# Patient Record
Sex: Female | Born: 1935 | Race: White | Hispanic: No | Marital: Married | Smoking: Never smoker
Health system: Southern US, Community
[De-identification: ages and names within clinical notes are randomized; demographics above are authoritative.]

## PROBLEM LIST (undated history)

## (undated) DIAGNOSIS — J449 Chronic obstructive pulmonary disease, unspecified: Secondary | ICD-10-CM

## (undated) DIAGNOSIS — E871 Hypo-osmolality and hyponatremia: Secondary | ICD-10-CM

## (undated) DIAGNOSIS — D649 Anemia, unspecified: Secondary | ICD-10-CM

## (undated) DIAGNOSIS — J42 Unspecified chronic bronchitis: Secondary | ICD-10-CM

## (undated) DIAGNOSIS — R931 Abnormal findings on diagnostic imaging of heart and coronary circulation: Secondary | ICD-10-CM

## (undated) DIAGNOSIS — C55 Malignant neoplasm of uterus, part unspecified: Secondary | ICD-10-CM

## (undated) DIAGNOSIS — T4145XA Adverse effect of unspecified anesthetic, initial encounter: Secondary | ICD-10-CM

## (undated) DIAGNOSIS — IMO0002 Reserved for concepts with insufficient information to code with codable children: Secondary | ICD-10-CM

## (undated) DIAGNOSIS — M1990 Unspecified osteoarthritis, unspecified site: Secondary | ICD-10-CM

## (undated) DIAGNOSIS — I10 Essential (primary) hypertension: Secondary | ICD-10-CM

## (undated) DIAGNOSIS — I429 Cardiomyopathy, unspecified: Secondary | ICD-10-CM

## (undated) DIAGNOSIS — J4489 Other specified chronic obstructive pulmonary disease: Secondary | ICD-10-CM

## (undated) DIAGNOSIS — E785 Hyperlipidemia, unspecified: Secondary | ICD-10-CM

## (undated) DIAGNOSIS — I700 Atherosclerosis of aorta: Secondary | ICD-10-CM

## (undated) DIAGNOSIS — J189 Pneumonia, unspecified organism: Secondary | ICD-10-CM

## (undated) DIAGNOSIS — T8859XA Other complications of anesthesia, initial encounter: Secondary | ICD-10-CM

## (undated) HISTORY — DX: Abnormal findings on diagnostic imaging of heart and coronary circulation: R93.1

## (undated) HISTORY — PX: SQUAMOUS CELL CARCINOMA EXCISION: SHX2433

## (undated) HISTORY — DX: Atherosclerosis of aorta: I70.0

## (undated) HISTORY — PX: BREAST BIOPSY: SHX20

## (undated) HISTORY — DX: Unspecified chronic bronchitis: J42

## (undated) HISTORY — DX: Unspecified osteoarthritis, unspecified site: M19.90

## (undated) HISTORY — DX: Other specified chronic obstructive pulmonary disease: J44.89

## (undated) HISTORY — PX: CATARACT EXTRACTION W/ INTRAOCULAR LENS  IMPLANT, BILATERAL: SHX1307

## (undated) HISTORY — PX: OTHER SURGICAL HISTORY: SHX169

## (undated) HISTORY — DX: Hyperlipidemia, unspecified: E78.5

## (undated) HISTORY — DX: Essential (primary) hypertension: I10

## (undated) HISTORY — DX: Chronic obstructive pulmonary disease, unspecified: J44.9

---
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
## 1964-05-25 HISTORY — PX: VAGINAL HYSTERECTOMY: SUR661

## 1964-05-25 HISTORY — PX: DILATION AND CURETTAGE OF UTERUS: SHX78

## 1997-12-17 ENCOUNTER — Ambulatory Visit (HOSPITAL_COMMUNITY): Admission: RE | Admit: 1997-12-17 | Discharge: 1997-12-17 | Payer: Self-pay | Admitting: Obstetrics and Gynecology

## 1998-12-12 ENCOUNTER — Other Ambulatory Visit: Admission: RE | Admit: 1998-12-12 | Discharge: 1998-12-12 | Payer: Self-pay | Admitting: Obstetrics and Gynecology

## 1998-12-19 ENCOUNTER — Ambulatory Visit (HOSPITAL_COMMUNITY): Admission: RE | Admit: 1998-12-19 | Discharge: 1998-12-19 | Payer: Self-pay | Admitting: Obstetrics and Gynecology

## 1998-12-19 ENCOUNTER — Encounter: Payer: Self-pay | Admitting: Obstetrics and Gynecology

## 1999-12-22 ENCOUNTER — Ambulatory Visit (HOSPITAL_COMMUNITY): Admission: RE | Admit: 1999-12-22 | Discharge: 1999-12-22 | Payer: Self-pay | Admitting: Obstetrics and Gynecology

## 1999-12-22 ENCOUNTER — Encounter: Payer: Self-pay | Admitting: Obstetrics and Gynecology

## 2001-01-03 ENCOUNTER — Ambulatory Visit (HOSPITAL_COMMUNITY): Admission: RE | Admit: 2001-01-03 | Discharge: 2001-01-03 | Payer: Self-pay | Admitting: Obstetrics and Gynecology

## 2001-01-03 ENCOUNTER — Encounter: Payer: Self-pay | Admitting: Obstetrics and Gynecology

## 2001-01-11 ENCOUNTER — Other Ambulatory Visit: Admission: RE | Admit: 2001-01-11 | Discharge: 2001-01-11 | Payer: Self-pay | Admitting: Obstetrics and Gynecology

## 2001-05-25 HISTORY — PX: CHOLECYSTECTOMY: SHX55

## 2001-12-07 ENCOUNTER — Inpatient Hospital Stay (HOSPITAL_COMMUNITY): Admission: EM | Admit: 2001-12-07 | Discharge: 2001-12-13 | Payer: Self-pay | Admitting: *Deleted

## 2001-12-07 ENCOUNTER — Encounter: Payer: Self-pay | Admitting: Emergency Medicine

## 2001-12-08 ENCOUNTER — Encounter: Payer: Self-pay | Admitting: General Surgery

## 2002-03-21 ENCOUNTER — Encounter: Payer: Self-pay | Admitting: Obstetrics and Gynecology

## 2002-03-21 ENCOUNTER — Encounter: Admission: RE | Admit: 2002-03-21 | Discharge: 2002-03-21 | Payer: Self-pay | Admitting: Obstetrics and Gynecology

## 2003-03-15 ENCOUNTER — Other Ambulatory Visit: Admission: RE | Admit: 2003-03-15 | Discharge: 2003-03-15 | Payer: Self-pay | Admitting: Obstetrics and Gynecology

## 2003-09-28 ENCOUNTER — Ambulatory Visit (HOSPITAL_COMMUNITY): Admission: RE | Admit: 2003-09-28 | Discharge: 2003-09-28 | Payer: Self-pay | Admitting: *Deleted

## 2004-09-23 ENCOUNTER — Other Ambulatory Visit: Admission: RE | Admit: 2004-09-23 | Discharge: 2004-09-23 | Payer: Self-pay | Admitting: Obstetrics and Gynecology

## 2010-12-05 ENCOUNTER — Encounter: Payer: Self-pay | Admitting: Cardiology

## 2010-12-05 DIAGNOSIS — R931 Abnormal findings on diagnostic imaging of heart and coronary circulation: Secondary | ICD-10-CM | POA: Insufficient documentation

## 2010-12-05 HISTORY — DX: Abnormal findings on diagnostic imaging of heart and coronary circulation: R93.1

## 2011-01-06 ENCOUNTER — Other Ambulatory Visit: Payer: Self-pay | Admitting: Allergy

## 2011-01-06 ENCOUNTER — Ambulatory Visit
Admission: RE | Admit: 2011-01-06 | Discharge: 2011-01-06 | Disposition: A | Discharge status: Home / Self Care | Payer: Medicare Other | Source: Ambulatory Visit | Attending: Allergy | Admitting: Allergy

## 2011-01-06 DIAGNOSIS — J209 Acute bronchitis, unspecified: Secondary | ICD-10-CM

## 2011-06-03 DIAGNOSIS — I10 Essential (primary) hypertension: Secondary | ICD-10-CM | POA: Diagnosis not present

## 2011-08-03 DIAGNOSIS — J209 Acute bronchitis, unspecified: Secondary | ICD-10-CM | POA: Diagnosis not present

## 2011-08-03 DIAGNOSIS — J309 Allergic rhinitis, unspecified: Secondary | ICD-10-CM | POA: Diagnosis not present

## 2011-08-03 DIAGNOSIS — J45909 Unspecified asthma, uncomplicated: Secondary | ICD-10-CM | POA: Diagnosis not present

## 2011-09-04 DIAGNOSIS — I10 Essential (primary) hypertension: Secondary | ICD-10-CM | POA: Diagnosis not present

## 2011-09-04 DIAGNOSIS — R002 Palpitations: Secondary | ICD-10-CM | POA: Diagnosis not present

## 2011-10-09 DIAGNOSIS — Z01419 Encounter for gynecological examination (general) (routine) without abnormal findings: Secondary | ICD-10-CM | POA: Diagnosis not present

## 2011-10-09 DIAGNOSIS — Z1231 Encounter for screening mammogram for malignant neoplasm of breast: Secondary | ICD-10-CM | POA: Diagnosis not present

## 2011-11-04 DIAGNOSIS — E782 Mixed hyperlipidemia: Secondary | ICD-10-CM | POA: Diagnosis not present

## 2011-11-04 DIAGNOSIS — Z0000 Encounter for general adult medical examination without abnormal findings: Secondary | ICD-10-CM | POA: Diagnosis not present

## 2011-11-04 DIAGNOSIS — R5383 Other fatigue: Secondary | ICD-10-CM | POA: Diagnosis not present

## 2011-11-04 DIAGNOSIS — I10 Essential (primary) hypertension: Secondary | ICD-10-CM | POA: Diagnosis not present

## 2011-11-04 DIAGNOSIS — R5381 Other malaise: Secondary | ICD-10-CM | POA: Diagnosis not present

## 2011-11-11 DIAGNOSIS — J45909 Unspecified asthma, uncomplicated: Secondary | ICD-10-CM | POA: Diagnosis not present

## 2011-11-11 DIAGNOSIS — I10 Essential (primary) hypertension: Secondary | ICD-10-CM | POA: Diagnosis not present

## 2011-11-11 DIAGNOSIS — R5381 Other malaise: Secondary | ICD-10-CM | POA: Diagnosis not present

## 2011-11-11 DIAGNOSIS — M159 Polyosteoarthritis, unspecified: Secondary | ICD-10-CM | POA: Diagnosis not present

## 2011-12-29 DIAGNOSIS — S92309A Fracture of unspecified metatarsal bone(s), unspecified foot, initial encounter for closed fracture: Secondary | ICD-10-CM | POA: Diagnosis not present

## 2012-01-07 DIAGNOSIS — S92309A Fracture of unspecified metatarsal bone(s), unspecified foot, initial encounter for closed fracture: Secondary | ICD-10-CM | POA: Diagnosis not present

## 2012-02-18 DIAGNOSIS — H26499 Other secondary cataract, unspecified eye: Secondary | ICD-10-CM | POA: Diagnosis not present

## 2012-02-19 DIAGNOSIS — Z23 Encounter for immunization: Secondary | ICD-10-CM | POA: Diagnosis not present

## 2012-03-07 DIAGNOSIS — E782 Mixed hyperlipidemia: Secondary | ICD-10-CM | POA: Diagnosis not present

## 2012-03-07 DIAGNOSIS — R0602 Shortness of breath: Secondary | ICD-10-CM | POA: Diagnosis not present

## 2012-03-07 DIAGNOSIS — I10 Essential (primary) hypertension: Secondary | ICD-10-CM | POA: Diagnosis not present

## 2012-03-14 DIAGNOSIS — D485 Neoplasm of uncertain behavior of skin: Secondary | ICD-10-CM | POA: Diagnosis not present

## 2012-03-14 DIAGNOSIS — L570 Actinic keratosis: Secondary | ICD-10-CM | POA: Diagnosis not present

## 2012-03-14 DIAGNOSIS — Z85828 Personal history of other malignant neoplasm of skin: Secondary | ICD-10-CM | POA: Diagnosis not present

## 2012-03-14 DIAGNOSIS — L821 Other seborrheic keratosis: Secondary | ICD-10-CM | POA: Diagnosis not present

## 2012-04-27 DIAGNOSIS — E782 Mixed hyperlipidemia: Secondary | ICD-10-CM | POA: Diagnosis not present

## 2012-04-27 DIAGNOSIS — I10 Essential (primary) hypertension: Secondary | ICD-10-CM | POA: Diagnosis not present

## 2012-04-27 DIAGNOSIS — D649 Anemia, unspecified: Secondary | ICD-10-CM | POA: Diagnosis not present

## 2012-05-04 DIAGNOSIS — J45909 Unspecified asthma, uncomplicated: Secondary | ICD-10-CM | POA: Diagnosis not present

## 2012-05-04 DIAGNOSIS — R5381 Other malaise: Secondary | ICD-10-CM | POA: Diagnosis not present

## 2012-05-04 DIAGNOSIS — I10 Essential (primary) hypertension: Secondary | ICD-10-CM | POA: Diagnosis not present

## 2012-05-04 DIAGNOSIS — E782 Mixed hyperlipidemia: Secondary | ICD-10-CM | POA: Diagnosis not present

## 2012-05-04 DIAGNOSIS — R5383 Other fatigue: Secondary | ICD-10-CM | POA: Diagnosis not present

## 2012-08-30 ENCOUNTER — Encounter: Payer: Self-pay | Admitting: Cardiology

## 2012-08-30 DIAGNOSIS — E782 Mixed hyperlipidemia: Secondary | ICD-10-CM | POA: Diagnosis not present

## 2012-08-30 DIAGNOSIS — R0602 Shortness of breath: Secondary | ICD-10-CM | POA: Diagnosis not present

## 2012-08-30 DIAGNOSIS — I10 Essential (primary) hypertension: Secondary | ICD-10-CM | POA: Diagnosis not present

## 2012-09-08 DIAGNOSIS — I10 Essential (primary) hypertension: Secondary | ICD-10-CM | POA: Diagnosis not present

## 2012-09-08 DIAGNOSIS — R5383 Other fatigue: Secondary | ICD-10-CM | POA: Diagnosis not present

## 2012-09-08 DIAGNOSIS — J45909 Unspecified asthma, uncomplicated: Secondary | ICD-10-CM | POA: Diagnosis not present

## 2012-09-08 DIAGNOSIS — E782 Mixed hyperlipidemia: Secondary | ICD-10-CM | POA: Diagnosis not present

## 2012-09-08 DIAGNOSIS — R5381 Other malaise: Secondary | ICD-10-CM | POA: Diagnosis not present

## 2012-09-15 DIAGNOSIS — R071 Chest pain on breathing: Secondary | ICD-10-CM | POA: Diagnosis not present

## 2012-09-15 DIAGNOSIS — R5381 Other malaise: Secondary | ICD-10-CM | POA: Diagnosis not present

## 2012-09-15 DIAGNOSIS — R079 Chest pain, unspecified: Secondary | ICD-10-CM | POA: Diagnosis not present

## 2012-09-15 DIAGNOSIS — E782 Mixed hyperlipidemia: Secondary | ICD-10-CM | POA: Diagnosis not present

## 2012-09-15 DIAGNOSIS — I10 Essential (primary) hypertension: Secondary | ICD-10-CM | POA: Diagnosis not present

## 2012-09-27 DIAGNOSIS — J209 Acute bronchitis, unspecified: Secondary | ICD-10-CM | POA: Diagnosis not present

## 2012-09-27 DIAGNOSIS — J309 Allergic rhinitis, unspecified: Secondary | ICD-10-CM | POA: Diagnosis not present

## 2012-09-27 DIAGNOSIS — J45909 Unspecified asthma, uncomplicated: Secondary | ICD-10-CM | POA: Diagnosis not present

## 2012-10-10 DIAGNOSIS — M949 Disorder of cartilage, unspecified: Secondary | ICD-10-CM | POA: Diagnosis not present

## 2012-10-10 DIAGNOSIS — Z124 Encounter for screening for malignant neoplasm of cervix: Secondary | ICD-10-CM | POA: Diagnosis not present

## 2012-10-10 DIAGNOSIS — Z13820 Encounter for screening for osteoporosis: Secondary | ICD-10-CM | POA: Diagnosis not present

## 2012-10-10 DIAGNOSIS — Z1231 Encounter for screening mammogram for malignant neoplasm of breast: Secondary | ICD-10-CM | POA: Diagnosis not present

## 2012-10-10 DIAGNOSIS — M899 Disorder of bone, unspecified: Secondary | ICD-10-CM | POA: Diagnosis not present

## 2012-10-28 ENCOUNTER — Other Ambulatory Visit: Payer: Self-pay | Admitting: Cardiology

## 2012-12-30 DIAGNOSIS — I10 Essential (primary) hypertension: Secondary | ICD-10-CM | POA: Diagnosis not present

## 2012-12-30 DIAGNOSIS — Z0000 Encounter for general adult medical examination without abnormal findings: Secondary | ICD-10-CM | POA: Diagnosis not present

## 2012-12-30 DIAGNOSIS — R5381 Other malaise: Secondary | ICD-10-CM | POA: Diagnosis not present

## 2012-12-30 DIAGNOSIS — E782 Mixed hyperlipidemia: Secondary | ICD-10-CM | POA: Diagnosis not present

## 2012-12-30 DIAGNOSIS — N390 Urinary tract infection, site not specified: Secondary | ICD-10-CM | POA: Diagnosis not present

## 2012-12-30 DIAGNOSIS — D649 Anemia, unspecified: Secondary | ICD-10-CM | POA: Diagnosis not present

## 2013-01-10 ENCOUNTER — Ambulatory Visit: Payer: Medicare Other | Admitting: Physician Assistant

## 2013-01-19 DIAGNOSIS — I10 Essential (primary) hypertension: Secondary | ICD-10-CM | POA: Diagnosis not present

## 2013-01-19 DIAGNOSIS — M159 Polyosteoarthritis, unspecified: Secondary | ICD-10-CM | POA: Diagnosis not present

## 2013-01-19 DIAGNOSIS — E782 Mixed hyperlipidemia: Secondary | ICD-10-CM | POA: Diagnosis not present

## 2013-01-19 DIAGNOSIS — Z23 Encounter for immunization: Secondary | ICD-10-CM | POA: Diagnosis not present

## 2013-01-19 DIAGNOSIS — J45909 Unspecified asthma, uncomplicated: Secondary | ICD-10-CM | POA: Diagnosis not present

## 2013-01-31 DIAGNOSIS — Z23 Encounter for immunization: Secondary | ICD-10-CM | POA: Diagnosis not present

## 2013-02-21 DIAGNOSIS — H26499 Other secondary cataract, unspecified eye: Secondary | ICD-10-CM | POA: Diagnosis not present

## 2013-02-21 DIAGNOSIS — H43399 Other vitreous opacities, unspecified eye: Secondary | ICD-10-CM | POA: Diagnosis not present

## 2013-03-01 ENCOUNTER — Ambulatory Visit (INDEPENDENT_AMBULATORY_CARE_PROVIDER_SITE_OTHER): Payer: Medicare Other | Admitting: Cardiology

## 2013-03-01 ENCOUNTER — Encounter: Payer: Self-pay | Admitting: Cardiology

## 2013-03-01 VITALS — BP 170/80 | HR 80 | Ht <= 58 in | Wt 130.0 lb

## 2013-03-01 DIAGNOSIS — J449 Chronic obstructive pulmonary disease, unspecified: Secondary | ICD-10-CM | POA: Diagnosis not present

## 2013-03-01 DIAGNOSIS — E785 Hyperlipidemia, unspecified: Secondary | ICD-10-CM | POA: Insufficient documentation

## 2013-03-01 DIAGNOSIS — B351 Tinea unguium: Secondary | ICD-10-CM | POA: Diagnosis not present

## 2013-03-01 DIAGNOSIS — I10 Essential (primary) hypertension: Secondary | ICD-10-CM

## 2013-03-01 DIAGNOSIS — L851 Acquired keratosis [keratoderma] palmaris et plantaris: Secondary | ICD-10-CM | POA: Diagnosis not present

## 2013-03-01 NOTE — Patient Instructions (Signed)
Let us know if your BP begins to increase.  A little high today.  Bring you cuff in one day and have nurse check it with our cuff.  Follow up with Dr. Herbie Baltimore in 6 months, call if any problems before that time.

## 2013-03-01 NOTE — Assessment & Plan Note (Signed)
Blood pressure is elevated today on recheck 160 systolic. She has a list of her outpatient blood pressures and they are all less than 140 systolic. She'll continue to monitor and if systolic pressure climbs she will call us.  Also asked her to bring her home cuff by the office the nurse could check it with our machines, to ensure accuracy.

## 2013-03-01 NOTE — Progress Notes (Signed)
03/01/2013   PCP: Georgianne Fick, MD   Chief Complaint  Patient presents with  . Follow-up    BP check up    Primary Cardiologist: Dr. Ranae Palms   HPI:  79 year old white married female presents today for routine followup. She has a history of hypertension, dyslipidemia, chronic bronchitis/COPD with long-standing asthma. She has no chest pain and no significant shortness of breath no more than her usual.  Her blood pressure is elevated today she does not understand why it would be elevated here and at home orthopedics it's normal. She has a list of her blood pressure she monitors at home and they all are less than 140 systolic.    Allergies  Allergen Reactions  . Asa [Aspirin] Shortness Of Breath    On high doses of ASA  . Keflex [Cephalexin]   . Penicillins     Current Outpatient Prescriptions  Medication Sig Dispense Refill  . ADVAIR DISKUS 250-50 MCG/DOSE AEPB Inhale 250 each into the lungs daily.      Marland Kitchen atorvastatin (LIPITOR) 10 MG tablet       . Azelastine HCl 0.15 % SOLN Place 2 sprays into the nose daily.      Marland Kitchen DILT-XR 120 MG 24 hr capsule TAKE 1 CAPSULE DAILY  90 capsule  2  . hydrALAZINE (APRESOLINE) 10 MG tablet Take 10 mg by mouth 2 (two) times daily.      . hydrochlorothiazide (HYDRODIURIL) 25 MG tablet Take 25 mg by mouth every morning.      . levalbuterol (XOPENEX HFA) 45 MCG/ACT inhaler Inhale 1-2 puffs into the lungs daily.      Marland Kitchen losartan (COZAAR) 50 MG tablet Take 50 mg by mouth 2 (two) times daily. 2 in the am and one in the pm      . montelukast (SINGULAIR) 10 MG tablet Take 10 mg by mouth daily.      . VENTOLIN HFA 108 (90 BASE) MCG/ACT inhaler        No current facility-administered medications for this visit.    Past Medical History  Diagnosis Date  . COPD with asthma   . HTN (hypertension)   . Dyslipidemia   . Chronic bronchitis   . Arthritis   . Echocardiogram abnormal 12/05/2010    Moderate concentric left ventricular  hypertrophy EF > 55% stage I diastolic dysfunction, left atrium mild to moderate dilatation, moderate mitral annular calcification with trace MR. Trace TR.    Past Surgical History  Procedure Laterality Date  . Vaginal hysterectomy  1966  . Appendectomy  1954  . Cataract extraction, bilateral    . Cholecystectomy  2003  . Skin cancer excision      face and leg    ZOX:WRUEAVW:UJ colds or fevers, no weight changes Skin:no rashes or ulcers HEENT:no blurred vision, no congestion CV:see HPI PUL:see HPI GI:no diarrhea constipation or melena, no indigestion GU:no hematuria, no dysuria MS:+ joint pain due to arthritis, no claudication Neuro:no syncope, no lightheadedness - unless she goes from sitting to standing Endo:no diabetes, no thyroid disease  PHYSICAL EXAM BP 170/80  Pulse 80  Ht 4\' 9"  (1.448 m)  Wt 130 lb (58.968 kg)  BMI 28.12 kg/m2 General:Pleasant affect, NAD Skin:Warm and dry, brisk capillary refill HEENT:normocephalic, sclera clear, mucus membranes moist Neck:supple, no JVD, no bruits, no adenopathy  Heart:S1S2 RRR without murmur, gallup, rub or click Lungsr without rales, occ rhonchi, no wheezes WJX:BJYN, non tender, + BS, do not palpate  liver spleen or masses Ext:no lower ext edema, 2+ pedal pulses, 2+ radial pulses Neuro:alert and oriented, MAE, follows commands, + facial symmetry   ASSESSMENT AND PLAN HTN (hypertension) Blood pressure is elevated today on recheck 160 systolic. She has a list of her outpatient blood pressures and they are all less than 140 systolic. She'll continue to monitor and if systolic pressure climbs she will call us.  Also asked her to bring her home cuff by the office the nurse could check it with our machines, to ensure accuracy.  COPD with asthma Currently stable she uses her nebulizers as needed  Dyslipidemia Followed by PCP   Followup with Dr. Herbie Baltimore in 6 months again she needs to have her blood pressure machine checked to  ensure that stable at home.

## 2013-03-01 NOTE — Assessment & Plan Note (Signed)
Currently stable she uses her nebulizers as needed

## 2013-03-01 NOTE — Assessment & Plan Note (Signed)
Followed by PCP

## 2013-03-20 DIAGNOSIS — L821 Other seborrheic keratosis: Secondary | ICD-10-CM | POA: Diagnosis not present

## 2013-03-20 DIAGNOSIS — Z85828 Personal history of other malignant neoplasm of skin: Secondary | ICD-10-CM | POA: Diagnosis not present

## 2013-03-21 DIAGNOSIS — H353190 Nonexudative age-related macular degeneration, unspecified eye, stage unspecified: Secondary | ICD-10-CM | POA: Diagnosis not present

## 2013-03-21 DIAGNOSIS — H35369 Drusen (degenerative) of macula, unspecified eye: Secondary | ICD-10-CM | POA: Diagnosis not present

## 2013-03-21 DIAGNOSIS — H26499 Other secondary cataract, unspecified eye: Secondary | ICD-10-CM | POA: Diagnosis not present

## 2013-04-05 ENCOUNTER — Ambulatory Visit: Payer: Self-pay | Admitting: Podiatrist

## 2013-04-07 ENCOUNTER — Other Ambulatory Visit: Payer: Self-pay | Admitting: Cardiology

## 2013-04-07 DIAGNOSIS — H26499 Other secondary cataract, unspecified eye: Secondary | ICD-10-CM | POA: Diagnosis not present

## 2013-04-07 NOTE — Telephone Encounter (Signed)
Rx was sent to pharmacy electronically. 

## 2013-04-10 DIAGNOSIS — J45909 Unspecified asthma, uncomplicated: Secondary | ICD-10-CM | POA: Diagnosis not present

## 2013-04-10 DIAGNOSIS — J309 Allergic rhinitis, unspecified: Secondary | ICD-10-CM | POA: Diagnosis not present

## 2013-05-04 DIAGNOSIS — H26499 Other secondary cataract, unspecified eye: Secondary | ICD-10-CM | POA: Diagnosis not present

## 2013-05-04 DIAGNOSIS — H43399 Other vitreous opacities, unspecified eye: Secondary | ICD-10-CM | POA: Diagnosis not present

## 2013-05-22 DIAGNOSIS — J069 Acute upper respiratory infection, unspecified: Secondary | ICD-10-CM | POA: Diagnosis not present

## 2013-05-22 DIAGNOSIS — R5381 Other malaise: Secondary | ICD-10-CM | POA: Diagnosis not present

## 2013-05-22 DIAGNOSIS — K5289 Other specified noninfective gastroenteritis and colitis: Secondary | ICD-10-CM | POA: Diagnosis not present

## 2013-05-22 DIAGNOSIS — R110 Nausea: Secondary | ICD-10-CM | POA: Diagnosis not present

## 2013-05-25 DIAGNOSIS — I700 Atherosclerosis of aorta: Secondary | ICD-10-CM

## 2013-05-25 HISTORY — DX: Atherosclerosis of aorta: I70.0

## 2013-06-06 ENCOUNTER — Other Ambulatory Visit: Payer: Self-pay | Admitting: Cardiology

## 2013-06-06 NOTE — Telephone Encounter (Signed)
Rx was sent to pharmacy electronically. 

## 2013-06-11 ENCOUNTER — Emergency Department (HOSPITAL_COMMUNITY): Payer: Medicare Other

## 2013-06-11 ENCOUNTER — Encounter (HOSPITAL_COMMUNITY): Payer: Self-pay | Admitting: Emergency Medicine

## 2013-06-11 ENCOUNTER — Inpatient Hospital Stay (HOSPITAL_COMMUNITY)
Admission: EM | Admit: 2013-06-11 | Discharge: 2013-06-13 | DRG: 191 | Disposition: A | Discharge status: Home / Self Care | Payer: Medicare Other | Attending: Internal Medicine | Admitting: Internal Medicine

## 2013-06-11 DIAGNOSIS — N179 Acute kidney failure, unspecified: Secondary | ICD-10-CM | POA: Diagnosis not present

## 2013-06-11 DIAGNOSIS — J449 Chronic obstructive pulmonary disease, unspecified: Secondary | ICD-10-CM | POA: Diagnosis not present

## 2013-06-11 DIAGNOSIS — Z85828 Personal history of other malignant neoplasm of skin: Secondary | ICD-10-CM | POA: Diagnosis not present

## 2013-06-11 DIAGNOSIS — I10 Essential (primary) hypertension: Secondary | ICD-10-CM | POA: Diagnosis present

## 2013-06-11 DIAGNOSIS — Z881 Allergy status to other antibiotic agents status: Secondary | ICD-10-CM

## 2013-06-11 DIAGNOSIS — R079 Chest pain, unspecified: Secondary | ICD-10-CM | POA: Diagnosis not present

## 2013-06-11 DIAGNOSIS — E876 Hypokalemia: Secondary | ICD-10-CM | POA: Diagnosis present

## 2013-06-11 DIAGNOSIS — R0602 Shortness of breath: Secondary | ICD-10-CM | POA: Diagnosis not present

## 2013-06-11 DIAGNOSIS — Z8249 Family history of ischemic heart disease and other diseases of the circulatory system: Secondary | ICD-10-CM | POA: Diagnosis not present

## 2013-06-11 DIAGNOSIS — Z825 Family history of asthma and other chronic lower respiratory diseases: Secondary | ICD-10-CM | POA: Diagnosis not present

## 2013-06-11 DIAGNOSIS — D649 Anemia, unspecified: Secondary | ICD-10-CM | POA: Diagnosis present

## 2013-06-11 DIAGNOSIS — E871 Hypo-osmolality and hyponatremia: Secondary | ICD-10-CM | POA: Diagnosis present

## 2013-06-11 DIAGNOSIS — Z9849 Cataract extraction status, unspecified eye: Secondary | ICD-10-CM | POA: Diagnosis not present

## 2013-06-11 DIAGNOSIS — R5381 Other malaise: Secondary | ICD-10-CM

## 2013-06-11 DIAGNOSIS — J441 Chronic obstructive pulmonary disease with (acute) exacerbation: Principal | ICD-10-CM | POA: Diagnosis present

## 2013-06-11 DIAGNOSIS — Z886 Allergy status to analgesic agent status: Secondary | ICD-10-CM

## 2013-06-11 DIAGNOSIS — Z880 Allergy status to penicillin: Secondary | ICD-10-CM | POA: Diagnosis not present

## 2013-06-11 DIAGNOSIS — M129 Arthropathy, unspecified: Secondary | ICD-10-CM | POA: Diagnosis present

## 2013-06-11 DIAGNOSIS — Z79899 Other long term (current) drug therapy: Secondary | ICD-10-CM

## 2013-06-11 DIAGNOSIS — K219 Gastro-esophageal reflux disease without esophagitis: Secondary | ICD-10-CM | POA: Diagnosis present

## 2013-06-11 DIAGNOSIS — D638 Anemia in other chronic diseases classified elsewhere: Secondary | ICD-10-CM | POA: Diagnosis present

## 2013-06-11 DIAGNOSIS — Z9089 Acquired absence of other organs: Secondary | ICD-10-CM | POA: Diagnosis not present

## 2013-06-11 DIAGNOSIS — R0789 Other chest pain: Secondary | ICD-10-CM | POA: Diagnosis not present

## 2013-06-11 DIAGNOSIS — IMO0002 Reserved for concepts with insufficient information to code with codable children: Secondary | ICD-10-CM

## 2013-06-11 DIAGNOSIS — E785 Hyperlipidemia, unspecified: Secondary | ICD-10-CM

## 2013-06-11 DIAGNOSIS — J45901 Unspecified asthma with (acute) exacerbation: Principal | ICD-10-CM

## 2013-06-11 LAB — POCT I-STAT, CHEM 8
BUN: 10 mg/dL (ref 6–23)
CHLORIDE: 83 meq/L — AB (ref 96–112)
CREATININE: 1.2 mg/dL — AB (ref 0.50–1.10)
Calcium, Ion: 1.07 mmol/L — ABNORMAL LOW (ref 1.13–1.30)
Glucose, Bld: 102 mg/dL — ABNORMAL HIGH (ref 70–99)
HCT: 30 % — ABNORMAL LOW (ref 36.0–46.0)
Hemoglobin: 10.2 g/dL — ABNORMAL LOW (ref 12.0–15.0)
Potassium: 2.9 mEq/L — CL (ref 3.7–5.3)
SODIUM: 118 meq/L — AB (ref 137–147)
TCO2: 21 mmol/L (ref 0–100)

## 2013-06-11 LAB — CBC WITH DIFFERENTIAL/PLATELET
Basophils Absolute: 0 10*3/uL (ref 0.0–0.1)
Basophils Relative: 1 % (ref 0–1)
EOS ABS: 0.1 10*3/uL (ref 0.0–0.7)
EOS PCT: 2 % (ref 0–5)
HCT: 23 % — ABNORMAL LOW (ref 36.0–46.0)
HEMOGLOBIN: 8.5 g/dL — AB (ref 12.0–15.0)
LYMPHS ABS: 2.3 10*3/uL (ref 0.7–4.0)
LYMPHS PCT: 38 % (ref 12–46)
MCH: 27.7 pg (ref 26.0–34.0)
MCHC: 36.6 g/dL — ABNORMAL HIGH (ref 30.0–36.0)
MCV: 75.4 fL — AB (ref 78.0–100.0)
MONOS PCT: 10 % (ref 3–12)
Monocytes Absolute: 0.6 10*3/uL (ref 0.1–1.0)
NEUTROS PCT: 49 % (ref 43–77)
Neutro Abs: 3 10*3/uL (ref 1.7–7.7)
PLATELETS: 234 10*3/uL (ref 150–400)
RBC: 3.05 MIL/uL — AB (ref 3.87–5.11)
RDW: 14.2 % (ref 11.5–15.5)
WBC: 6.2 10*3/uL (ref 4.0–10.5)

## 2013-06-11 LAB — URINALYSIS, ROUTINE W REFLEX MICROSCOPIC
Bilirubin Urine: NEGATIVE
Glucose, UA: NEGATIVE mg/dL
Hgb urine dipstick: NEGATIVE
Ketones, ur: NEGATIVE mg/dL
NITRITE: NEGATIVE
PROTEIN: NEGATIVE mg/dL
SPECIFIC GRAVITY, URINE: 1.005 (ref 1.005–1.030)
UROBILINOGEN UA: 0.2 mg/dL (ref 0.0–1.0)
pH: 7 (ref 5.0–8.0)

## 2013-06-11 LAB — D-DIMER, QUANTITATIVE (NOT AT ARMC): D DIMER QUANT: 0.58 ug{FEU}/mL — AB (ref 0.00–0.48)

## 2013-06-11 LAB — POCT I-STAT TROPONIN I: TROPONIN I, POC: 0.01 ng/mL (ref 0.00–0.08)

## 2013-06-11 LAB — URINE MICROSCOPIC-ADD ON

## 2013-06-11 LAB — TROPONIN I: Troponin I: <0.3 ng/mL (ref <0.30)

## 2013-06-11 MED ORDER — PREDNISONE 20 MG PO TABS
60.0000 mg | ORAL_TABLET | Freq: Once | ORAL | Status: AC
  Administered 2013-06-11: 60 mg via ORAL
  Filled 2013-06-11: qty 3

## 2013-06-11 MED ORDER — IPRATROPIUM BROMIDE 0.02 % IN SOLN
0.5000 mg | Freq: Four times a day (QID) | RESPIRATORY_TRACT | Status: DC
  Administered 2013-06-11 – 2013-06-13 (×8): 0.5 mg via RESPIRATORY_TRACT
  Filled 2013-06-11 (×7): qty 2.5

## 2013-06-11 MED ORDER — ACETAMINOPHEN 325 MG PO TABS: 650.0000 mg | ORAL_TABLET | Freq: Four times a day (QID) | ORAL | Status: DC | PRN

## 2013-06-11 MED ORDER — MONTELUKAST SODIUM 10 MG PO TABS
10.0000 mg | ORAL_TABLET | Freq: Every day | ORAL | Status: DC
  Administered 2013-06-12 (×2): 10 mg via ORAL
  Filled 2013-06-11 (×3): qty 1

## 2013-06-11 MED ORDER — SODIUM CHLORIDE 0.9 % IV SOLN
INTRAVENOUS | Status: AC
  Administered 2013-06-11: 23:00:00 via INTRAVENOUS

## 2013-06-11 MED ORDER — AZELASTINE HCL 0.1 % NA SOLN
2.0000 | Freq: Every day | NASAL | Status: DC
  Administered 2013-06-12 – 2013-06-13 (×2): 2 via NASAL
  Filled 2013-06-11: qty 30

## 2013-06-11 MED ORDER — HYDRALAZINE HCL 10 MG PO TABS
10.0000 mg | ORAL_TABLET | Freq: Three times a day (TID) | ORAL | Status: DC
  Administered 2013-06-12 – 2013-06-13 (×6): 10 mg via ORAL
  Filled 2013-06-11 (×8): qty 1

## 2013-06-11 MED ORDER — ATORVASTATIN CALCIUM 10 MG PO TABS
10.0000 mg | ORAL_TABLET | ORAL | Status: DC
  Administered 2013-06-12: 10 mg via ORAL
  Filled 2013-06-11: qty 1

## 2013-06-11 MED ORDER — ADULT MULTIVITAMIN W/MINERALS CH
1.0000 | ORAL_TABLET | Freq: Every day | ORAL | Status: DC
  Administered 2013-06-12 – 2013-06-13 (×2): 1 via ORAL
  Filled 2013-06-11 (×2): qty 1

## 2013-06-11 MED ORDER — SODIUM CHLORIDE 0.9 % IV SOLN
INTRAVENOUS | Status: DC
Start: 2013-06-11 — End: 2013-06-13
  Administered 2013-06-12: 07:00:00 via INTRAVENOUS
  Administered 2013-06-12 (×2): 1 mL via INTRAVENOUS
  Administered 2013-06-13: 75 mL/h via INTRAVENOUS

## 2013-06-11 MED ORDER — SODIUM CHLORIDE 0.9 % IV BOLUS (SEPSIS)
500.0000 mL | Freq: Once | INTRAVENOUS | Status: AC
  Administered 2013-06-11: 500 mL via INTRAVENOUS

## 2013-06-11 MED ORDER — LEVALBUTEROL HCL 0.63 MG/3ML IN NEBU
0.6300 mg | INHALATION_SOLUTION | Freq: Four times a day (QID) | RESPIRATORY_TRACT | Status: DC
  Administered 2013-06-11 – 2013-06-13 (×8): 0.63 mg via RESPIRATORY_TRACT
  Filled 2013-06-11 (×13): qty 3

## 2013-06-11 MED ORDER — HYDROCODONE-ACETAMINOPHEN 5-325 MG PO TABS: 1.0000 | ORAL_TABLET | ORAL | Status: DC | PRN

## 2013-06-11 MED ORDER — LEVALBUTEROL HCL 0.63 MG/3ML IN NEBU
0.6300 mg | INHALATION_SOLUTION | RESPIRATORY_TRACT | Status: DC | PRN
  Filled 2013-06-11: qty 3

## 2013-06-11 MED ORDER — ACETAMINOPHEN 650 MG RE SUPP: 650.0000 mg | Freq: Four times a day (QID) | RECTAL | Status: DC | PRN

## 2013-06-11 MED ORDER — IOHEXOL 350 MG/ML SOLN
100.0000 mL | Freq: Once | INTRAVENOUS | Status: AC | PRN
  Administered 2013-06-11: 100 mL via INTRAVENOUS

## 2013-06-11 MED ORDER — ONDANSETRON HCL 4 MG PO TABS: 4.0000 mg | ORAL_TABLET | Freq: Four times a day (QID) | ORAL | Status: DC | PRN

## 2013-06-11 MED ORDER — IPRATROPIUM-ALBUTEROL 0.5-2.5 (3) MG/3ML IN SOLN
3.0000 mL | Freq: Once | RESPIRATORY_TRACT | Status: AC
Start: 2013-06-11 — End: 2013-06-11
  Administered 2013-06-11: 3 mL via RESPIRATORY_TRACT
  Filled 2013-06-11: qty 3

## 2013-06-11 MED ORDER — DILTIAZEM HCL ER 120 MG PO CP24
120.0000 mg | ORAL_CAPSULE | Freq: Every day | ORAL | Status: DC
  Administered 2013-06-12 – 2013-06-13 (×2): 120 mg via ORAL
  Filled 2013-06-11 (×2): qty 1

## 2013-06-11 MED ORDER — POTASSIUM CHLORIDE CRYS ER 20 MEQ PO TBCR
40.0000 meq | EXTENDED_RELEASE_TABLET | Freq: Once | ORAL | Status: AC
  Administered 2013-06-11: 40 meq via ORAL
  Filled 2013-06-11: qty 2

## 2013-06-11 MED ORDER — SODIUM CHLORIDE 0.9 % IV SOLN: INTRAVENOUS | Status: DC

## 2013-06-11 MED ORDER — SODIUM CHLORIDE 0.9 % IJ SOLN
3.0000 mL | Freq: Two times a day (BID) | INTRAMUSCULAR | Status: DC
  Administered 2013-06-11 – 2013-06-12 (×2): 3 mL via INTRAVENOUS

## 2013-06-11 MED ORDER — METHYLPREDNISOLONE SODIUM SUCC 125 MG IJ SOLR
60.0000 mg | Freq: Two times a day (BID) | INTRAMUSCULAR | Status: DC
  Administered 2013-06-12 (×2): 60 mg via INTRAVENOUS
  Administered 2013-06-12: 01:00:00 via INTRAVENOUS
  Administered 2013-06-13: 60 mg via INTRAVENOUS
  Filled 2013-06-11 (×5): qty 0.96

## 2013-06-11 MED ORDER — IPRATROPIUM BROMIDE 0.02 % IN SOLN
0.5000 mg | RESPIRATORY_TRACT | Status: DC | PRN
  Filled 2013-06-11: qty 2.5

## 2013-06-11 MED ORDER — ONDANSETRON HCL 4 MG/2ML IJ SOLN: 4.0000 mg | Freq: Four times a day (QID) | INTRAMUSCULAR | Status: DC | PRN

## 2013-06-11 NOTE — ED Provider Notes (Signed)
CSN: [id number]     Arrival date & time 06/11/13  1653 History   First MD Initiated Contact with Patient 06/11/13 1712     Chief Complaint  Patient presents with  . Chest Pain   (Consider location/radiation/quality/duration/timing/severity/associated sxs/prior Treatment) HPI Comments: Patient presents with worsening shortness of breath and chest today. She's felt tight across her chest similar to weakness that she feels her asthma is flaring up. She feels like she can't get a good breath. She denies any increased cough or chest congestion. She denies any fevers or chills. She denies any nausea vomiting or diarrhea. There's been no leg swelling or calf tenderness. She is not on oxygen at home. She used her Xopenex inhaler and albuterol nebulizer without relief.  Patient is a 80 y.o. female presenting with chest pain.  Chest Pain Associated symptoms: fatigue and shortness of breath   Associated symptoms: no abdominal pain, no back pain, no cough, no diaphoresis, no dizziness, no fever, no headache, no nausea, no numbness, not vomiting and no weakness     Past Medical History  Diagnosis Date  . COPD with asthma   . HTN (hypertension)   . Dyslipidemia   . Chronic bronchitis   . Arthritis   . Echocardiogram abnormal 12/05/2010    Moderate concentric left ventricular hypertrophy EF > 29% stage I diastolic dysfunction, left atrium mild to moderate dilatation, moderate mitral annular calcification with trace MR. Trace TR.   Past Surgical History  Procedure Laterality Date  . Vaginal hysterectomy  1966  . Appendectomy  1954  . Cataract extraction, bilateral    . Cholecystectomy  2003  . Skin cancer excision      face and leg   Family History  Problem Relation Age of Onset  . Heart attack Mother   . Cancer Father   . Heart attack Maternal Grandmother   . Heart disease Son   . Asthma Daughter    History  Substance Use Topics  . Smoking status: Never Smoker   . Smokeless tobacco:  Never Used  . Alcohol Use: No   OB History   Grav Para Term Preterm Abortions TAB SAB Ect Mult Living                 Review of Systems  Constitutional: Positive for fatigue. Negative for fever, chills and diaphoresis.  HENT: Negative for congestion, rhinorrhea and sneezing.   Eyes: Negative.   Respiratory: Positive for shortness of breath. Negative for cough and chest tightness.   Cardiovascular: Positive for chest pain. Negative for leg swelling.  Gastrointestinal: Negative for nausea, vomiting, abdominal pain, diarrhea and blood in stool.  Genitourinary: Negative for frequency, hematuria, flank pain and difficulty urinating.  Musculoskeletal: Negative for arthralgias and back pain.  Skin: Negative for rash.  Neurological: Negative for dizziness, speech difficulty, weakness, numbness and headaches.    Allergies  Asa; Penicillins; and Keflex  Home Medications   Current Outpatient Rx  Name  Route  Sig  Dispense  Refill  . acetaminophen (TYLENOL) 500 MG tablet   Oral   Take 1,000 mg by mouth every 6 (six) hours as needed.         Marland Kitchen ADVAIR DISKUS 250-50 MCG/DOSE AEPB   Inhalation   Inhale 1 puff into the lungs 2 (two) times daily.          Marland Kitchen albuterol (ACCUNEB) 0.63 MG/3ML nebulizer solution   Nebulization   Take 1 ampule by nebulization every 6 (six) hours as needed for  wheezing.         . Artificial Tear Ointment (DRY EYES OP)   Ophthalmic   Apply 1 drop to eye 4 (four) times daily as needed (dry eyes).         Marland Kitchen atorvastatin (LIPITOR) 10 MG tablet   Oral   Take 10 mg by mouth every other day.          . Azelastine HCl 0.15 % SOLN   Nasal   Place 2 sprays into the nose daily.         Marland Kitchen CALCIUM PO   Oral   Take 1 tablet by mouth daily.         . Cholecalciferol (VITAMIN D PO)   Oral   Take 1 tablet by mouth daily.         . Cyanocobalamin (VITAMIN B-12 PO)   Oral   Take 1 tablet by mouth daily.         Marland Kitchen diltiazem (DILACOR XR) 120 MG 24  hr capsule   Oral   Take 120 mg by mouth daily.         . hydrALAZINE (APRESOLINE) 10 MG tablet   Oral   Take 10 mg by mouth daily.          . hydrochlorothiazide (HYDRODIURIL) 25 MG tablet   Oral   Take 25 mg by mouth every morning.         . levalbuterol (XOPENEX HFA) 45 MCG/ACT inhaler   Inhalation   Inhale 1-2 puffs into the lungs every 6 (six) hours as needed for wheezing or shortness of breath.          . losartan (COZAAR) 50 MG tablet   Oral   Take 50-100 mg by mouth 2 (two) times daily. 2 tablets in the morning and 1 tablet in the evening         . montelukast (SINGULAIR) 10 MG tablet   Oral   Take 10 mg by mouth at bedtime.          . Multiple Vitamin (MULTIVITAMIN WITH MINERALS) TABS tablet   Oral   Take 1 tablet by mouth daily.         . VENTOLIN HFA 108 (90 BASE) MCG/ACT inhaler   Inhalation   Inhale 2 puffs into the lungs every 4 (four) hours as needed for wheezing or shortness of breath.          Marland Kitchen VITAMIN E PO   Oral   Take 1 capsule by mouth daily.          BP 136/65  Pulse 77  Temp(Src) 98.5 F (36.9 C) (Oral)  Resp 20  SpO2 100% Physical Exam  Constitutional: She is oriented to person, place, and time. She appears well-developed and well-nourished.  HENT:  Head: Normocephalic and atraumatic.  Eyes: Pupils are equal, round, and reactive to light.  Neck: Normal range of motion. Neck supple.  Cardiovascular: Normal rate, regular rhythm and normal heart sounds.   Pulmonary/Chest: Effort normal and breath sounds normal. No respiratory distress. She has no wheezes. She has no rales. She exhibits no tenderness.  Mildly diminished breath sounds bilaterally. No increased work of breathing. No tachypnea.  Abdominal: Soft. Bowel sounds are normal. There is no tenderness. There is no rebound and no guarding.  Musculoskeletal: Normal range of motion. She exhibits no edema.  No calf tenderness  Lymphadenopathy:    She has no cervical  adenopathy.  Neurological: She is alert and oriented  to person, place, and time.  Skin: Skin is warm and dry. No rash noted.  Psychiatric: She has a normal mood and affect.    ED Course  Procedures (including critical care time) Labs Review Labs Reviewed  CBC WITH DIFFERENTIAL - Abnormal; Notable for the following:    RBC 3.05 (*)    Hemoglobin 8.5 (*)    HCT 23.0 (*)    MCV 75.4 (*)    MCHC 36.6 (*)    All other components within normal limits  POCT I-STAT, CHEM 8 - Abnormal; Notable for the following:    Sodium 118 (*)    Potassium 2.9 (*)    Chloride 83 (*)    Creatinine, Ser 1.20 (*)    Glucose, Bld 102 (*)    Calcium, Ion 1.07 (*)    Hemoglobin 10.2 (*)    HCT 30.0 (*)    All other components within normal limits  D-DIMER, QUANTITATIVE  URINALYSIS, ROUTINE W REFLEX MICROSCOPIC  POCT I-STAT TROPONIN I   Imaging Review Dg Chest 2 View  06/11/2013   CLINICAL DATA:  Sternal chest tightness and shortness of breath. History of asthma, hypertension, and cardiomyopathy. Nonsmoker.  EXAM: CHEST  2 VIEW  COMPARISON:  01/06/2011  FINDINGS: Mild hyperinflation.  Moderate thoracic spondylosis.  Midline trachea. Normal heart size with a tortuous atherosclerotic thoracic aorta. No pleural effusion or pneumothorax. No congestive failure. Mild bibasilar volume loss.  IMPRESSION: Cardiomegaly and mild hyperinflation; no acute findings.   Electronically Signed   By: Abigail Miyamoto M.D.   On: 06/11/2013 18:00    EKG Interpretation    Date/Time:  Sunday June 11 2013 17:23:05 EST Ventricular Rate:  90 PR Interval:  236 QRS Duration: 91 QT Interval:  382 QTC Calculation: 467 R Axis:     Text Interpretation:  Sinus rhythm Multiple premature complexes, vent  Prolonged PR interval Borderline T abnormalities, lateral leads Baseline wander in lead(s) V6 since last tracing no significant change Confirmed by Dason Mosley  MD, Aivah Putman ([id number]) on 06/11/2013 6:02:53 PM            MDM   1. Anemia    2. Hyponatremia   3. Hypokalemia    Patient received a breathing treatment which did not really improve her symptoms. She has no increased work of breathing. Her oxygen level is normal. She has marked anemia with a hemoglobin of 8. She says he she had a history of anemia a long time ago but doesn't know what was the cause of that or what her baseline hemoglobin is. She also has marked hyponatremia without a known history of hyponatremia. She was given normal saline as well as potassium replacement. A rectal exam did not show any gross blood, Hemoccult is negative.  D-dimer slightly elevated.  CTA chest pending.  I will consult hospitalist for admission.    Malvin Johns, MD 06/11/13 2015

## 2013-06-11 NOTE — ED Notes (Signed)
Pt declined sandwich at this time.

## 2013-06-11 NOTE — H&P (Signed)
[facility] History and Physical  KORI GOINS ATF:[id number] DOB: 1922-08-22 DOA: 06/11/2013  Referring physician: ER physician PCP: [name], MD   Chief Complaint: shortness of breath  HPI:  80 year old female with past medical history of COPD/asthma, hypertension who presented to [facility] ED 06/11/2013 with worsening shortness of breath with assocaited chest tightness especially with breathing. She reported the symptoms started about 1 day prior to this admission but no associated fever or chills. Chest tightness comes and goes and there was no significant complaint of cough. No abdominal pain, no nausea or vomiting. No reports of blood in stool or urine. No lightheadedness or loss of consciousness. In ED, her vitals were stable, BP was 136/65, HR 78, T max 98.5 F and oxygen saturation 99% on room air. Blood work revealed hemoglobin of 8.5. BMP revealed sodium of 118, potassium of 2.9 and creatinine of 1.2. CXR showed cardiomegaly but no acute infectious process. CT angio chest ordered in ED and was negative for PE. Pt was given nebulizer treatments in ED and one dose of PO prednisone but continued to feel short of breath for which reason [facility] admitted for further evaluation and management.   Assessment and Plan:  Principal Problem:   COPD (chronic obstructive pulmonary disease) - has history of COPD/asthma - continue nebulizer treatments every 6 hours scheduled and then every 2 hours PRN - solumedrol 60 mg IV Q 12 hours - oxygen support via nasal canula to keep O2 saturation above 90% - no evidence of pneumonia on CXR, no fever and no elevate WBC count on admission so antibiotic treatment deferred for now Active Problems:   Chest pain - pt reported chest pressure with breathing - D dimer elevated slightly on the admission - CT angio chest negative  - obtain CE   HTN (hypertension) - hold hctz and cozaar but continue Cardizem and hydralazine (change hydralazine to 10 mg Q 8  hours)   Dyslipidemia - continue statin therapy   Hyponatremia - likely due to Hctz  - continue IV fluids, hold Hctz - also will check cortisol level   Acute renal failure - likely due to Hctz, perhaps cozaar; hold those meds - may restart Cardizem and hydralazine (she take unusual regimen of hydralazine once a day, have changed it to Q 8 hours since we are holding Hctz and cozaar and SBP in 150's   Hypokalemia - due to Hctz - also check cortisol level - repleted in ED   Anemia - possible iron deficiency, MCV 75 - hemoglobin 8.5 on admission and then 10.2  Radiological Exams on Admission: Dg Chest 2 View 06/11/2013    IMPRESSION: Cardiomegaly and mild hyperinflation; no acute findings.   Electronically Signed   By: Abigail Miyamoto M.D.   On: 06/11/2013 18:00    EKG: Normal sinus rhythm  Code Status: Full Family Communication: Pt at bedside Disposition Plan: Admit for further evaluation  Leisa Lenz, MD  [facility] Pager [phone]  Review of Systems:  Constitutional: Negative for fever, chills and malaise/fatigue. Negative for diaphoresis.  HENT: Negative for hearing loss, ear pain, nosebleeds, congestion, sore throat, neck pain, tinnitus and ear discharge.   Eyes: Negative for blurred vision, double vision, photophobia, pain, discharge and redness.  Respiratory: Negative for cough, hemoptysis, sputum production, positive for shortness of breath, no wheezing and stridor.   Cardiovascular: Negative for chest pain, palpitations, orthopnea, claudication and leg swelling.  Gastrointestinal: Negative for nausea, vomiting and abdominal pain. Negative for heartburn, constipation, blood in  stool and melena.  Genitourinary: Negative for dysuria, urgency, frequency, hematuria and flank pain.  Musculoskeletal: Negative for myalgias, back pain, joint pain and falls.  Skin: Negative for itching and rash.  Neurological: Negative for dizziness and weakness. Negative for tingling,  tremors, sensory change, speech change, focal weakness, loss of consciousness and headaches.  Endo/Heme/Allergies: Negative for environmental allergies and polydipsia. Does not bruise/bleed easily.  Psychiatric/Behavioral: Negative for suicidal ideas. The patient is not nervous/anxious.      Past Medical History  Diagnosis Date  . COPD with asthma   . HTN (hypertension)   . Dyslipidemia   . Chronic bronchitis   . Arthritis   . Echocardiogram abnormal 12/05/2010    Moderate concentric left ventricular hypertrophy EF > 10% stage I diastolic dysfunction, left atrium mild to moderate dilatation, moderate mitral annular calcification with trace MR. Trace TR.   Past Surgical History  Procedure Laterality Date  . Vaginal hysterectomy  1966  . Appendectomy  1954  . Cataract extraction, bilateral    . Cholecystectomy  2003  . Skin cancer excision      face and leg   Social History:  reports that she has never smoked. She has never used smokeless tobacco. She reports that she does not drink alcohol or use illicit drugs.  Allergies  Allergen Reactions  . Asa [Aspirin] Shortness Of Breath    On high doses of ASA  . Penicillins     wheezing  . Keflex [Cephalexin] Rash    Family History  Problem Relation Age of Onset  . Heart attack Mother   . Cancer Father   . Heart attack Maternal Grandmother   . Heart disease Son   . Asthma Daughter      Prior to Admission medications   Medication Sig Start Date End Date Taking? Authorizing Provider  acetaminophen (TYLENOL) 500 MG tablet Take 1,000 mg by mouth every 6 (six) hours as needed.   Yes Historical Provider, MD  ADVAIR DISKUS 250-50 MCG/DOSE AEPB Inhale 1 puff into the lungs 2 (two) times daily.  01/26/13  Yes Historical Provider, MD  albuterol (ACCUNEB) 0.63 MG/3ML nebulizer solution Take 1 ampule by nebulization every 6 (six) hours as needed for wheezing.   Yes Historical Provider, MD  Artificial Tear Ointment (DRY EYES OP) Apply 1 drop  to eye 4 (four) times daily as needed (dry eyes).   Yes Historical Provider, MD  atorvastatin (LIPITOR) 10 MG tablet Take 10 mg by mouth every other day.  01/30/13  Yes Historical Provider, MD  Azelastine HCl 0.15 % SOLN Place 2 sprays into the nose daily. 12/03/12  Yes Historical Provider, MD  CALCIUM PO Take 1 tablet by mouth daily.   Yes Historical Provider, MD  Cholecalciferol (VITAMIN D PO) Take 1 tablet by mouth daily.   Yes Historical Provider, MD  Cyanocobalamin (VITAMIN B-12 PO) Take 1 tablet by mouth daily.   Yes Historical Provider, MD  diltiazem (DILACOR XR) 120 MG 24 hr capsule Take 120 mg by mouth daily.   Yes Historical Provider, MD  hydrALAZINE (APRESOLINE) 10 MG tablet Take 10 mg by mouth daily.    Yes Historical Provider, MD  hydrochlorothiazide (HYDRODIURIL) 25 MG tablet Take 25 mg by mouth every morning. 02/06/13  Yes Historical Provider, MD  levalbuterol Penne Lash HFA) 45 MCG/ACT inhaler Inhale 1-2 puffs into the lungs every 6 (six) hours as needed for wheezing or shortness of breath.    Yes Historical Provider, MD  losartan (COZAAR) 50 MG tablet Take  50-100 mg by mouth 2 (two) times daily. 2 tablets in the morning and 1 tablet in the evening   Yes Historical Provider, MD  montelukast (SINGULAIR) 10 MG tablet Take 10 mg by mouth at bedtime.  12/03/12  Yes Historical Provider, MD  Multiple Vitamin (MULTIVITAMIN WITH MINERALS) TABS tablet Take 1 tablet by mouth daily.   Yes Historical Provider, MD  VENTOLIN HFA 108 (90 BASE) MCG/ACT inhaler Inhale 2 puffs into the lungs every 4 (four) hours as needed for wheezing or shortness of breath.  01/26/13  Yes Historical Provider, MD  VITAMIN E PO Take 1 capsule by mouth daily.   Yes Historical Provider, MD   Physical Exam: Filed Vitals:   06/11/13 1845 06/11/13 1857 06/11/13 1900 06/11/13 1930  BP: 136/65  136/59 153/66  Pulse: 78  79 83  Temp:      TempSrc:      Resp: 20  13 14   SpO2: 100% 100% 100% 100%    Physical Exam   Constitutional: Appears ill, no acute distress HENT: Normocephalic. Dry mucus membranes Eyes: Conjunctivae and EOM are normal. PERRLA, no scleral icterus.  Neck: Normal ROM. Neck supple. No JVD. No tracheal deviation. No thyromegaly.  CVS: RRR, S1/S2 appreciated  Pulmonary: diminished breath sounds, coarse breath sounds.  Abdominal: Soft. BS +,  no distension, tenderness, rebound or guarding.  Musculoskeletal: Normal range of motion. No edema and no tenderness.  Lymphadenopathy: No lymphadenopathy noted, cervical, inguinal. Neuro: Alert. No focal neurological deficits. Skin: Skin is warm and dry. No rash noted. Not diaphoretic. No erythema. No pallor.  Psychiatric: Normal mood and affect. Behavior, judgment, thought content normal.   Labs on Admission:  Basic Metabolic Panel:  Recent Labs Lab 06/11/13 1924  NA 118*  K 2.9*  CL 83*  GLUCOSE 102*  BUN 10  CREATININE 1.20*   Liver Function Tests: No results found for this basename: AST, ALT, ALKPHOS, BILITOT, PROT, ALBUMIN,  in the last 168 hours No results found for this basename: LIPASE, AMYLASE,  in the last 168 hours No results found for this basename: AMMONIA,  in the last 168 hours CBC:  Recent Labs Lab 06/11/13 1918 06/11/13 1924  WBC 6.2  --   NEUTROABS 3.0  --   HGB 8.5* 10.2*  HCT 23.0* 30.0*  MCV 75.4*  --   PLT 234  --    Cardiac Enzymes: No results found for this basename: CKTOTAL, CKMB, CKMBINDEX, TROPONINI,  in the last 168 hours BNP: No components found with this basename: POCBNP,  CBG: No results found for this basename: GLUCAP,  in the last 168 hours  If 7PM-7AM, please contact night-coverage [email or web address] Password [facility] 06/11/2013, 8:38 PM

## 2013-06-11 NOTE — ED Notes (Signed)
DR BELFI GIVEN A COPY OF CHEM 8 RESULTS.

## 2013-06-11 NOTE — ED Notes (Signed)
The pt arrived from home by gems with complaints of sob and chest  Pressure since this am.  She has been diagnosed with bronchitis.  She has had her inhaler and a hhn at home with no relief.  The chest pressure increases with movement and with inspiration.   No distress at present

## 2013-06-12 DIAGNOSIS — D649 Anemia, unspecified: Secondary | ICD-10-CM

## 2013-06-12 DIAGNOSIS — E785 Hyperlipidemia, unspecified: Secondary | ICD-10-CM

## 2013-06-12 DIAGNOSIS — I10 Essential (primary) hypertension: Secondary | ICD-10-CM

## 2013-06-12 LAB — COMPREHENSIVE METABOLIC PANEL
ALT: 15 U/L (ref 0–35)
ALT: 16 U/L (ref 0–35)
AST: 25 U/L (ref 0–37)
AST: 28 U/L (ref 0–37)
Albumin: 3.3 g/dL — ABNORMAL LOW (ref 3.5–5.2)
Albumin: 3.5 g/dL (ref 3.5–5.2)
Alkaline Phosphatase: 64 U/L (ref 39–117)
Alkaline Phosphatase: 67 U/L (ref 39–117)
BILIRUBIN TOTAL: 0.3 mg/dL (ref 0.3–1.2)
BUN: 10 mg/dL (ref 6–23)
BUN: 10 mg/dL (ref 6–23)
CALCIUM: 8.5 mg/dL (ref 8.4–10.5)
CALCIUM: 8.9 mg/dL (ref 8.4–10.5)
CHLORIDE: 88 meq/L — AB (ref 96–112)
CO2: 20 mEq/L (ref 19–32)
CO2: 21 meq/L (ref 19–32)
Chloride: 85 mEq/L — ABNORMAL LOW (ref 96–112)
Creatinine, Ser: 1 mg/dL (ref 0.50–1.10)
Creatinine, Ser: 1 mg/dL (ref 0.50–1.10)
GFR calc non Af Amer: 49 mL/min — ABNORMAL LOW (ref >90)
GFR, EST AFRICAN AMERICAN: 57 mL/min — AB (ref >90)
GFR, EST AFRICAN AMERICAN: 57 mL/min — AB (ref >90)
GFR, EST NON AFRICAN AMERICAN: 49 mL/min — AB (ref >90)
GLUCOSE: 134 mg/dL — AB (ref 70–99)
GLUCOSE: 172 mg/dL — AB (ref 70–99)
POTASSIUM: 3.8 meq/L (ref 3.7–5.3)
Potassium: 3.7 mEq/L (ref 3.7–5.3)
Sodium: 121 mEq/L — CL (ref 137–147)
Sodium: 125 mEq/L — ABNORMAL LOW (ref 137–147)
Total Bilirubin: 0.4 mg/dL (ref 0.3–1.2)
Total Protein: 5.7 g/dL — ABNORMAL LOW (ref 6.0–8.3)
Total Protein: 6 g/dL (ref 6.0–8.3)

## 2013-06-12 LAB — CBC WITH DIFFERENTIAL/PLATELET
BASOS ABS: 0 10*3/uL (ref 0.0–0.1)
Basophils Relative: 0 % (ref 0–1)
EOS ABS: 0 10*3/uL (ref 0.0–0.7)
EOS PCT: 0 % (ref 0–5)
HCT: 30.7 % — ABNORMAL LOW (ref 36.0–46.0)
Hemoglobin: 11 g/dL — ABNORMAL LOW (ref 12.0–15.0)
Lymphocytes Relative: 11 % — ABNORMAL LOW (ref 12–46)
Lymphs Abs: 0.8 10*3/uL (ref 0.7–4.0)
MCH: 27.4 pg (ref 26.0–34.0)
MCHC: 35.8 g/dL (ref 30.0–36.0)
MCV: 76.6 fL — AB (ref 78.0–100.0)
MONO ABS: 0.1 10*3/uL (ref 0.1–1.0)
Monocytes Relative: 2 % — ABNORMAL LOW (ref 3–12)
Neutro Abs: 6.5 10*3/uL (ref 1.7–7.7)
Neutrophils Relative %: 88 % — ABNORMAL HIGH (ref 43–77)
Platelets: 268 10*3/uL (ref 150–400)
RBC: 4.01 MIL/uL (ref 3.87–5.11)
RDW: 14.2 % (ref 11.5–15.5)
WBC: 7.4 10*3/uL (ref 4.0–10.5)

## 2013-06-12 LAB — CBC
HCT: 30.1 % — ABNORMAL LOW (ref 36.0–46.0)
HEMOGLOBIN: 10.6 g/dL — AB (ref 12.0–15.0)
MCH: 27.3 pg (ref 26.0–34.0)
MCHC: 35.2 g/dL (ref 30.0–36.0)
MCV: 77.6 fL — ABNORMAL LOW (ref 78.0–100.0)
Platelets: 259 10*3/uL (ref 150–400)
RBC: 3.88 MIL/uL (ref 3.87–5.11)
RDW: 14.3 % (ref 11.5–15.5)
WBC: 4.1 10*3/uL (ref 4.0–10.5)

## 2013-06-12 LAB — IRON AND TIBC
Iron: 28 ug/dL — ABNORMAL LOW (ref 42–135)
Saturation Ratios: 10 % — ABNORMAL LOW (ref 20–55)
TIBC: 275 ug/dL (ref 250–470)
UIBC: 247 ug/dL (ref 125–400)

## 2013-06-12 LAB — OCCULT BLOOD, POC DEVICE: FECAL OCCULT BLD: NEGATIVE

## 2013-06-12 LAB — PHOSPHORUS: Phosphorus: 2.3 mg/dL (ref 2.3–4.6)

## 2013-06-12 LAB — TSH: TSH: 2.946 u[IU]/mL (ref 0.350–4.500)

## 2013-06-12 LAB — VITAMIN B12: VITAMIN B 12: 1692 pg/mL — AB (ref 211–911)

## 2013-06-12 LAB — RETICULOCYTES
RBC.: 3.57 MIL/uL — AB (ref 3.87–5.11)
RETIC CT PCT: 0.7 % (ref 0.4–3.1)
Retic Count, Absolute: 25 10*3/uL (ref 19.0–186.0)

## 2013-06-12 LAB — GLUCOSE, CAPILLARY: GLUCOSE-CAPILLARY: 157 mg/dL — AB (ref 70–99)

## 2013-06-12 LAB — MAGNESIUM: Magnesium: 1.6 mg/dL (ref 1.5–2.5)

## 2013-06-12 LAB — TROPONIN I
Troponin I: <0.3 ng/mL (ref <0.30)
Troponin I: <0.3 ng/mL (ref <0.30)

## 2013-06-12 LAB — APTT: APTT: 32 s (ref 24–37)

## 2013-06-12 LAB — FOLATE: Folate: >20 ng/mL

## 2013-06-12 LAB — FERRITIN: FERRITIN: 101 ng/mL (ref 10–291)

## 2013-06-12 LAB — PROTIME-INR
INR: 0.95 (ref 0.00–1.49)
Prothrombin Time: 12.5 seconds (ref 11.6–15.2)

## 2013-06-12 LAB — CORTISOL: CORTISOL PLASMA: 87.6 ug/dL

## 2013-06-12 MED ORDER — PANTOPRAZOLE SODIUM 40 MG PO TBEC
40.0000 mg | DELAYED_RELEASE_TABLET | Freq: Every day | ORAL | Status: DC
  Administered 2013-06-12 – 2013-06-13 (×2): 40 mg via ORAL
  Filled 2013-06-12 (×2): qty 1

## 2013-06-12 NOTE — Progress Notes (Signed)
[facility] PROGRESS NOTE  JASKIRAN PATA GYF:[id number] DOB: 11/23/1922 DOA: 06/11/2013 PCP: [name], MD  Assessment/Plan: 1-SOB due to COPD (chronic obstructive pulmonary disease) exacerbation: -improving, currently breathing better and with just slight exp wheezing -continue PRN and QID nebs; continue solumedrol -continue PRN oxygen supplementation -follow clinical response; most likely home in am  2-HTN (hypertension): stable with cardizen and hydralazine. -HCTZ and cozaar on hold due to ARF and hyponatremia  3-Dyslipidemia: continue statins  4-Chest discomfort: due to COPD and/or GERD -troponin neg -continue PPI -currently resolved  5-Hyponatremia and hypokalemia: due to dehydration and diuretics  -continue IVF's and electrolytes repletion -continue holding HCTZ  6-Acute renal failure:continue IVF's, UA no suggesting UTI. -continue holding HCTZ and cozaar.  7-Anemia: AOCD most likely. Will check anemia panel.  Code Status: partial  Family Communication: no family at bedside Disposition Plan: home with home health PT   Consultants:  None   Procedures:  See below for x-ray reports  Antibiotics:  None   HPI/Subjective: Afebrile, feeling better and breathing better.  Objective: Filed Vitals:   06/12/13 1015  BP: 126/60  Pulse: 94  Temp: 98.3 F (36.8 C)  Resp: 16    Intake/Output Summary (Last 24 hours) at 06/12/13 1408 Last data filed at 06/12/13 1336  Gross per 24 hour  Intake   1530 ml  Output    850 ml  Net    680 ml   Filed Weights   06/11/13 2205 06/12/13 0513  Weight: 53.751 kg (118 lb 8 oz) 52.617 kg (116 lb)    Exam:   General:  Afebrile, feeling and breathing better  Cardiovascular: S1 and s2, no rubs or gallops  Respiratory: slight exp wheezing, no crackles; decreased BS  Abdomen: soft, NT, ND, positive BS  Musculoskeletal: no swelling, no cyanosis  Data Reviewed: Basic Metabolic Panel:  Recent  Labs Lab 06/11/13 1924 06/11/13 2345 06/12/13 0335  NA 118* 121* 125*  K 2.9* 3.8 3.7  CL 83* 85* 88*  CO2  --  20 21  GLUCOSE 102* 134* 172*  BUN 10 10 10   CREATININE 1.20* 1.00 1.00  CALCIUM  --  8.9 8.5  MG  --  1.6  --   PHOS  --  2.3  --    Liver Function Tests:  Recent Labs Lab 06/11/13 2345 06/12/13 0335  AST 28 25  ALT 16 15  ALKPHOS 67 64  BILITOT 0.4 0.3  PROT 6.0 5.7*  ALBUMIN 3.5 3.3*   CBC:  Recent Labs Lab 06/11/13 1918 06/11/13 1924 06/11/13 2345 06/12/13 0335  WBC 6.2  --  7.4 4.1  NEUTROABS 3.0  --  6.5  --   HGB 8.5* 10.2* 11.0* 10.6*  HCT 23.0* 30.0* 30.7* 30.1*  MCV 75.4*  --  76.6* 77.6*  PLT 234  --  268 259   Cardiac Enzymes:  Recent Labs Lab 06/11/13 2236 06/12/13 0335 06/12/13 0843  TROPONINI <0.30 <0.30 <0.30   CBG:  Recent Labs Lab 06/12/13 0612  GLUCAP 157*   Studies: Dg Chest 2 View  06/11/2013   CLINICAL DATA:  Sternal chest tightness and shortness of breath. History of asthma, hypertension, and cardiomyopathy. Nonsmoker.  EXAM: CHEST  2 VIEW  COMPARISON:  01/06/2011  FINDINGS: Mild hyperinflation.  Moderate thoracic spondylosis.  Midline trachea. Normal heart size with a tortuous atherosclerotic thoracic aorta. No pleural effusion or pneumothorax. No congestive failure. Mild bibasilar volume loss.  IMPRESSION: Cardiomegaly and mild hyperinflation; no acute findings.   Electronically Signed  By: Abigail Miyamoto M.D.   On: 06/11/2013 18:00   Ct Angio Chest Pe W/cm &/or Wo Cm  06/11/2013   CLINICAL DATA:  Shortness of breath with chest pressure. Bronchitis. Question pulmonary embolism.  EXAM: CT ANGIOGRAPHY CHEST WITH CONTRAST  TECHNIQUE: Multidetector CT imaging of the chest was performed using the standard protocol during bolus administration of intravenous contrast. Multiplanar CT image reconstructions including MIPs were obtained to evaluate the vascular anatomy.  CONTRAST:  151mL OMNIPAQUE IOHEXOL 350 MG/ML SOLN   COMPARISON:  DG CHEST 2 VIEW dated 06/11/2013; DG CHEST 2 VIEW dated 01/06/2011  FINDINGS: The pulmonary arteries are well opacified with contrast. There is no evidence of acute pulmonary embolism. There is diffuse atherosclerosis of the aorta, great vessels and coronary arteries.  No enlarged mediastinal or hilar lymph nodes are identified. There is mild heterogeneity of the thyroid gland without dominant nodule. There is no significant pleural or pericardial effusion.  The lungs demonstrate mild emphysema with scattered scarring. There is no confluent airspace opacity, endobronchial lesion or suspicious pulmonary nodule. The visualized upper abdomen appears unremarkable.  There are mild degenerative changes throughout the spine. No acute osseous findings are demonstrated.  Review of the MIP images confirms the above findings.  IMPRESSION: 1. No evidence of acute pulmonary embolism. No acute chest findings demonstrated. 2. Diffuse atherosclerosis. 3. Chronic lung disease with mild emphysema.   Electronically Signed   By: Camie Patience M.D.   On: 06/11/2013 20:50    Scheduled Meds: . atorvastatin  10 mg Oral QODAY  . azelastine  2 spray Each Nare Daily  . diltiazem  120 mg Oral Daily  . hydrALAZINE  10 mg Oral Q8H  . ipratropium  0.5 mg Nebulization Q6H  . levalbuterol  0.63 mg Nebulization Q6H  . methylPREDNISolone (SOLU-MEDROL) injection  60 mg Intravenous Q12H  . montelukast  10 mg Oral QHS  . multivitamin with minerals  1 tablet Oral Daily  . sodium chloride  3 mL Intravenous Q12H   Continuous Infusions: . sodium chloride 1 mL (06/12/13 1350)  . sodium chloride      Time spent: >30 minutes    Dayshia Ballinas  [facility] Pager [phone]. If 7PM-7AM, please contact night-coverage at [email or web address], password [facility] 06/12/2013, 2:08 PM  LOS: 1 day

## 2013-06-12 NOTE — Care Management Note (Addendum)
    Page 1 of 2   06/14/2013     8:54:03 AM   CARE MANAGEMENT NOTE 06/14/2013  Patient:  Jasmin Lee, Jasmin Lee   Account Number:  [id number]  Date Initiated:  06/12/2013  Documentation initiated by:  Mariann Laster  Subjective/Objective Assessment:   admitted with COPD     Action/Plan:   CM Consult   Anticipated DC Date:  06/15/2013   Anticipated DC Plan:  [place]  CM consult      Choice offered to / List presented to:             Status of service:  Completed, signed off Medicare Important Message given?   (If response is "NO", the following Medicare IM given date fields will be blank) Date Medicare IM given:   Date Additional Medicare IM given:    Discharge Disposition:  HOME/SELF CARE  Per UR Regulation:  Reviewed for med. necessity/level of care/duration of stay  If discussed at [place] of Stay Meetings, dates discussed:    Comments:  06/13/2013 PT Recs:  Supervision/Assistance - 24 hour;Home health PT -Patient refuses all HH Services:  RN/PT DME Recs:  Rolling walker with 5" wheels - (Pt declining RW at present) OT: Recs:  None Patient  states she has neighbors and church friends to help as needed.  States she is active and cares for her husband.  Denies falls. PCP:  yes Home DME:  Cane, Nebulizer No further CM needs identified at this time. Crystal Hutchinson RN, BSN, [place], CCM 06/13/2013   06/12/2013 PT Recs:  Supervision/Assistance - 24 hour;Home health PT DME:  Rolling walker with 5" wheels (Pt declining RW at present) OT: Recs:  None Disposition:  pending Crysal Hutchinson RN, BSN, MSHL, CCm 06/12/2013

## 2013-06-12 NOTE — Progress Notes (Signed)
Patient evaluated for community based chronic disease management services with [place] [facility] as a benefit of patient's [employer]. Spoke with patient at bedside to explain [place] [facility] services.  Written consents obtained.  Patient may benefit from follow up for recent weight loss and deconditioning secondary to COPD.  She is the primary caregiver for her husband and admits that she does not take the best care of herself.  She does not have an acute management plan in place for her COPD at this time.  Patient will receive a post discharge transition of care call and will be evaluated for monthly home visits for assessments and disease process education.  Left contact information and [facility] literature at bedside. Made Inpatient Case Manager aware that [place] Management following. Of note, [facility] Care Management services does not replace or interfere with any services that are arranged by inpatient case management or social work.  For additional questions or referrals please contact Corliss Blacker BSN RN [facility] Liaison at [phone].

## 2013-06-12 NOTE — Progress Notes (Signed)
UR completed Ivionna Verley K. Granger Chui, RN, BSN, [place], CCM  06/12/2013 4:24 PM

## 2013-06-12 NOTE — Evaluation (Signed)
Physical Therapy Evaluation Patient Details Name: Jasmin Lee MRN: [id number] DOB: September 14, 1922 Today's Date: 06/12/2013 Time: 8657-8469 PT Time Calculation (min): 29 min  PT Assessment / Plan / Recommendation History of Present Illness  Pt admit with COPD.  Clinical Impression  Pt admitted with above. Pt currently with functional limitations due to the deficits listed below (see PT Problem List). 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  Recommended use of RW for pt (preferably 4 wheeled) for safety but pt declines at present.   Will need HHPT  f/u as well. Pt will benefit from skilled PT to increase their independence and safety with mobility to allow discharge to the venue listed below.     PT Assessment  Patient needs continued PT services    Follow Up Recommendations  Supervision/Assistance - 24 hour;Home health PT        Barriers to Discharge Decreased caregiver support Pt cooks and assist husband with ADLs.    Equipment Recommendations  Rolling walker with 5" wheels (Pt declining RW at present.  )         Frequency Min 3X/week    Precautions / Restrictions Precautions Precautions: Fall Restrictions Weight Bearing Restrictions: No   Pertinent Vitals/Pain VSS, no pain      Mobility  Bed Mobility General bed mobility comments: Pt on 3N1 on arrival. Transfers Overall transfer level: Needs assistance Equipment used: 1 person hand held assist Transfers: Sit to/from Stand Sit to Stand: Min guard General transfer comment: Pt needed cues for hand placement.  Cleaned self without assist.  STeadying assis needed upon initial stand.   Ambulation/Gait Ambulation/Gait assistance: Min assist Ambulation Distance (Feet): 75 Feet Assistive device: 1 person hand held assist Gait Pattern/deviations: Step-through pattern;Decreased stride length;Narrow base of support;Trunk flexed Gait velocity: decreased Gait velocity interpretation: Below normal speed for age/gender General  Gait Details: Pt needed steadying assist with ambulation.  Needed HHA and used IV pole for steadiness.  Mentioned RW for pt to use at home but pt states she does well with cane.  Pt is very careful with steps and does have some safety awareness as she moves slowly around the room.  Pt ultimately should use RW for safety but declines.     Exercises General Exercises - Lower Extremity Ankle Circles/Pumps: AROM;Both;15 reps;Seated Quad Sets: AROM;5 reps;Seated;Both Long Arc Quad: AROM;Both;10 reps;Seated   PT Diagnosis: Generalized weakness  PT Problem List: Decreased activity tolerance;Decreased balance;Decreased mobility;Decreased knowledge of use of DME;Decreased safety awareness;Decreased knowledge of precautions PT Treatment Interventions: Gait training;DME instruction;Functional mobility training;Therapeutic activities;Therapeutic exercise;Balance training;Patient/family education     PT Goals(Current goals can be found in the care plan section) Acute Rehab PT Goals Patient Stated Goal: to go home PT Goal Formulation: With patient Time For Goal Achievement: 06/19/13 Potential to Achieve Goals: Fair  Visit Information  Last PT Received On: 06/12/13 Assistance Needed: +1 History of Present Illness: Pt admit with COPD.       Prior [place] expects to be discharged to:: Private residence Living Arrangements: Spouse/significant other Available Help at Discharge: Family;Available PRN/intermittently Type of Home: House Home Access: Stairs to enter [employer] of Steps: 1 Entrance Stairs-Rails: None Home Layout: One level Home Equipment: Cane - single point;Shower seat - built in;Grab bars - tub/shower;Grab bars - toilet Additional Comments: Pt husband has dementia so she cooks and help him with ADLs.   Prior Function Level of Independence: Independent (sometimes pt used cane when her knees and ankle arthritis  fl)  Communication Communication: No difficulties    Cognition  Cognition Arousal/Alertness: Awake/alert Behavior During Therapy: WFL for tasks assessed/performed Overall Cognitive Status: Within Functional Limits for tasks assessed    Extremity/Trunk Assessment Upper Extremity Assessment Upper Extremity Assessment: Defer to OT evaluation Lower Extremity Assessment Lower Extremity Assessment: RLE deficits/detail;LLE deficits/detail RLE Deficits / Details: grossly 3/5 LLE Deficits / Details: grossly 3/5 Cervical / Trunk Assessment Cervical / Trunk Assessment: Kyphotic   Balance Balance Overall balance assessment: Needs assistance Sitting-balance support: Feet supported;Bilateral upper extremity supported Sitting balance-Leahy Scale: Good Standing balance support: Bilateral upper extremity supported;During functional activity Standing balance-Leahy Scale: Fair High level balance activites: Turns;Sudden stops;Direction changes High Level Balance Comments: min guard asssit needed for challenges.  End of Session PT - End of Session Equipment Utilized During Treatment: Gait belt Activity Tolerance: Patient limited by fatigue Patient left: in chair;with call bell/phone within reach Nurse Communication: Mobility status       INGOLD,Jacquelin Krajewski 06/12/2013, 11:23 AM [facility] [phone] (pager)

## 2013-06-12 NOTE — Evaluation (Signed)
Occupational Therapy Evaluation and Discharge Patient Details Name: Jasmin Lee MRN: [id number] DOB: February 01, 1923 Today's Date: 06/12/2013 Time: 8756-4332 OT Time Calculation (min): 21 min  OT Assessment / Plan / Recommendation History of present illness Pt admitted with COPD.   Clinical Impression   This 80 yo pt presents to acute OT with above dx. She is currently functioning at baseline with her ADL and IADL skills and does not require further OT services at this time. Acute OT to discharge pt.    OT Assessment  Patient does not need any further OT services    Follow Up Recommendations  No OT follow up       Equipment Recommendations  None recommended by OT          Precautions / Restrictions Precautions Precautions: Fall Restrictions Weight Bearing Restrictions: No       ADL  Lower Body Dressing: Performed;Independent Where Assessed - Lower Body Dressing: Unsupported sitting Toilet Transfer: Performed;Min guard Science writer: Regular height toilet Toileting - Clothing Manipulation and Hygiene: Performed;Independent Where Assessed - Toileting Clothing Manipulation and Hygiene: Standing ADL Comments: Pt reports independence in all ADLs. 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       Prior [place] expects to be discharged to:: Private residence Living Arrangements: Spouse/significant other Available Help at Discharge: Family;Available PRN/intermittently Type of Home: House Home Access: Stairs to enter [employer] of Steps: 1 Entrance Stairs-Rails: None Home Layout: One level Home Equipment: Cane - single point;Shower seat - built in;Grab bars - tub/shower;Grab  bars - toilet Additional Comments: Pt's husband has dementia so she cooks and help him with ADLs.   Prior Function Level of Independence: Independent Communication Communication: No difficulties         Vision/Perception Vision - History Baseline Vision: No visual deficits   Cognition  Cognition Arousal/Alertness: Awake/alert Behavior During Therapy: WFL for tasks assessed/performed Overall Cognitive Status: Within Functional Limits for tasks assessed    Extremity/Trunk Assessment Upper Extremity Assessment Upper Extremity Assessment: Overall WFL for tasks assessed Lower Extremity Assessment Lower Extremity Assessment: Defer to PT evaluation     Mobility Transfers Overall transfer level: Independent Transfers: Sit to/from Stand Sit to Stand: Min guard General transfer comment: Pt had near LOB x1 during ambulation, but self corrected. Did not use AD.        Balance Balance Overall balance assessment: Needs assistance Sitting-balance support: Feet supported;No upper extremity supported Sitting balance-Leahy Scale: Good Standing balance support: No upper extremity supported;During functional activity Standing balance-Leahy Scale: Good   End of Session OT - End of Session Activity Tolerance: Patient tolerated treatment well Patient left: in chair       Almon Register [phone] 06/12/2013, 2:15 PM

## 2013-06-12 NOTE — Progress Notes (Signed)
Na 121, before 118, notified MD, will continue to monitor, thanks Arvella Nigh RN

## 2013-06-13 DIAGNOSIS — R5381 Other malaise: Secondary | ICD-10-CM

## 2013-06-13 LAB — BASIC METABOLIC PANEL
BUN: 14 mg/dL (ref 6–23)
CO2: 21 mEq/L (ref 19–32)
CREATININE: 1.02 mg/dL (ref 0.50–1.10)
Calcium: 8.2 mg/dL — ABNORMAL LOW (ref 8.4–10.5)
Chloride: 97 mEq/L (ref 96–112)
GFR calc Af Amer: 55 mL/min — ABNORMAL LOW (ref >90)
GFR, EST NON AFRICAN AMERICAN: 48 mL/min — AB (ref >90)
Glucose, Bld: 167 mg/dL — ABNORMAL HIGH (ref 70–99)
Potassium: 3.6 mEq/L — ABNORMAL LOW (ref 3.7–5.3)
SODIUM: 131 meq/L — AB (ref 137–147)

## 2013-06-13 LAB — GLUCOSE, CAPILLARY: GLUCOSE-CAPILLARY: 143 mg/dL — AB (ref 70–99)

## 2013-06-13 MED ORDER — PANTOPRAZOLE SODIUM 40 MG PO TBEC: 40.0000 mg | DELAYED_RELEASE_TABLET | Freq: Every day | ORAL | Status: AC

## 2013-06-13 MED ORDER — PANTOPRAZOLE SODIUM 40 MG PO TBEC: 40.0000 mg | DELAYED_RELEASE_TABLET | Freq: Every day | ORAL | Status: DC

## 2013-06-13 MED ORDER — FUROSEMIDE 20 MG PO TABS: 20.0000 mg | ORAL_TABLET | Freq: Every day | ORAL | Status: DC

## 2013-06-13 MED ORDER — PREDNISONE 10 MG PO TABS: ORAL_TABLET | ORAL | Status: DC

## 2013-06-13 MED ORDER — HYDRALAZINE HCL 10 MG PO TABS: 10.0000 mg | ORAL_TABLET | Freq: Three times a day (TID) | ORAL | Status: DC

## 2013-06-13 MED ORDER — LOSARTAN POTASSIUM 50 MG PO TABS: 100.0000 mg | ORAL_TABLET | Freq: Every day | ORAL | Status: DC

## 2013-06-13 NOTE — Progress Notes (Signed)
Client will call her cardiologist instead of seeing primary doctor that is listed on discharge instructions.  Otherwise verbalized understanding of discharge instructions.  Given duplicates of medication RX in order to get those needed today and send others to [facility] ( takes 4 days to fill).  Neighbors here to give spouse and patient a ride home.

## 2013-06-13 NOTE — Progress Notes (Signed)
Physical Therapy Treatment Patient Details Name: Jasmin Lee MRN: [id number] DOB: 16-Aug-1922 Today's Date: 06/13/2013 Time: 1035-1100 PT Time Calculation (min): 25 min  PT Assessment / Plan / Recommendation  History of Present Illness Pt admitted with COPD.   PT Comments   Pt admitted with above. Pt currently with functional limitations due to balance and endurance deficits.  Pt will benefit from skilled PT to increase their independence and safety with mobility to allow discharge to the venue listed below.   Follow Up Recommendations  Supervision/Assistance - 24 hour;Home health PT                 Equipment Recommendations  Rolling walker with 5" wheels (Pt still declines RW. )        Frequency Min 3X/week   Progress towards PT Goals Progress towards PT goals: Progressing toward goals  Plan Current plan remains appropriate    Precautions / Restrictions Precautions Precautions: Fall Restrictions Weight Bearing Restrictions: No   Pertinent Vitals/Pain VSS, No pain    Mobility  Bed Mobility Overal bed mobility: Independent Transfers Overall transfer level: Independent Transfers: Sit to/from Stand Sit to Stand: Supervision General transfer comment: No steadying assist needed with sit to stand. Ambulation/Gait Ambulation/Gait assistance: Min guard Ambulation Distance (Feet): 350 Feet Assistive device: 1 person hand held assist;None Gait Pattern/deviations: Step-through pattern;Decreased stride length Gait velocity interpretation: Below normal speed for age/gender General Gait Details: Pt much steadier today with ambulation.  Used IV pole but also no assistive device.  Pt still declining RW at home but agrees to use cane and pt much steadier today therefore feel that cane will be appropriate for pt at home.      Exercises General Exercises - Lower Extremity Ankle Circles/Pumps: AROM;Both;15 reps;Seated Quad Sets: AROM;5 reps;Seated;Both Long Arc Quad: AROM;Both;10  reps;Seated   PT Goals (current goals can now be found in the care plan section)    Visit Information  Last PT Received On: 06/13/13 Assistance Needed: +1 History of Present Illness: Pt admitted with COPD.    Subjective Data  Subjective: "I am leaving today I think"   Cognition  Cognition Arousal/Alertness: Awake/alert Behavior During Therapy: WFL for tasks assessed/performed Overall Cognitive Status: Within Functional Limits for tasks assessed    Balance  Balance Standing balance support: No upper extremity supported;During functional activity Standing balance-Leahy Scale: Good High level balance activites: Turns;Sudden stops;Head turns High Level Balance Comments: supervision with min challenges.  End of Session PT - End of Session Equipment Utilized During Treatment: Gait belt Activity Tolerance: Patient limited by fatigue Patient left: in chair;with call bell/phone within reach Nurse Communication: Mobility status        INGOLD,Demetreus Lothamer 06/13/2013, 3:55 PM [facility] [phone] (pager)

## 2013-06-13 NOTE — Discharge Summary (Addendum)
Physician Discharge Summary  Jasmin Lee QQI:[id number] DOB: [birth year] DOA: 06/11/2013  PCP: [name], MD  Admit date: 06/11/2013 Discharge date: 06/13/2013  Time spent: >30  minutes  Recommendations for Outpatient Follow-up:  1. BMET to follow electrolytes and renal function 2. CBC to follow Hgb trend. 3. Reassess BP and adjust meds as needed  Discharge Diagnoses:  Principal Problem:   COPD (chronic obstructive pulmonary disease) Active Problems:   HTN (hypertension)   Dyslipidemia   Hyponatremia   Acute renal failure   Hypokalemia   Anemia E. Coli UTI  Discharge Condition: stable and improved. Discharge home with [facility] services for deconditioning.  Diet recommendation: heart healthy diet  Filed Weights   06/11/13 2205 06/12/13 0513 06/13/13 0515  Weight: 53.751 kg (118 lb 8 oz) 52.617 kg (116 lb) 55.2 kg (121 lb 11.1 oz)    History of present illness:  80 year old female with past medical history of COPD/asthma, hypertension who presented to [facility] ED 06/11/2013 with worsening shortness of breath with assocaited chest tightness especially with breathing. She reported the symptoms started about 1 day prior to this admission but no associated fever or chills. Chest tightness comes and goes and there was no significant complaint of cough. No abdominal pain, no nausea or vomiting. No reports of blood in stool or urine. No lightheadedness or loss of consciousness.  In ED, her vitals were stable, BP was 136/65, HR 78, T max 98.5 F and oxygen saturation 99% on room air. Blood work revealed hemoglobin of 8.5. BMP revealed sodium of 118, potassium of 2.9 and creatinine of 1.2. CXR showed cardiomegaly but no acute infectious process. CT angio chest ordered in ED and was negative for PE. Pt was given nebulizer treatments in ED and one dose of PO prednisone but continued to feel short of breath for which reason [facility] admitted for further evaluation and management.    Hospital Course:   1-SOB due to COPD (chronic obstructive pulmonary disease) exacerbation:  -improving, currently breathing better and without exp wheezing  -continue PRN nebs; tapering prednisone   -continue advair -will follow with PCP in 10 days  2-HTN (hypertension): stable with cardizen, lasix  and hydralazine.  -HCTZ discontinue due to hyponatremia  -cozaar resumed but at lower dose. -reassess BP and adjust meds during PCP follow up visit in 10 days  3-Dyslipidemia: continue statins   4-Chest discomfort: due to COPD and/or GERD  -troponin neg  -continue PPI  -currently resolved   5-Hyponatremia and hypokalemia: due to dehydration and diuretics  -resolved.  -continue holding HCTZ  -patient advised to keep herself well hydrated  6-Acute renal failure: pretty much resolved with IVF's -HCTZ discontinued -cozaar dose to be resume on 1/21 and dose has been adjusted -UA no suggesting UTI.   7-Anemia: AOCD most likely. With some iron deficiency. Patient was instructed to use ferrous sulfate OTC and will follow with PCP for further evaluation and tx.  8-physical deconditioning: will arrange for HHPT.  Addendum  9-E.Coli UTI: advise to keep herself hydrated and will treat with levaquin X 5 days.  Labs came after patient was discharged. Patient was contacted directly by me and symptoms reviewed and results shared. Patient endorses no dysuria, but reports some increase frequency (symptom she forgot to share with [place] while inpatient). Will treat as above.  Procedures:  See below for x-ray reports  Consultations:  None   Discharge Exam: Filed Vitals:   06/13/13 1426  BP: 144/65  Pulse: 84  Temp:  Resp:    General: Afebrile, feeling and breathing better Cardiovascular: S1 and s2, no rubs or gallops  Respiratory: no exp wheezing, no crackles; improved air movement BS  Abdomen: soft, NT, ND, positive BS  Musculoskeletal: no swelling, no cyanosis    Discharge Instructions  Discharge  Orders   Future Orders Complete By Expires   Discharge instructions  As directed    Comments:     Take medications as prescribed Follow with PCP in 10 days Keep yourself well hydrated Follow a heart healthy diet   Face-to-face encounter (required for Medicare/Medicaid patients)  As directed    Comments:     I Roshan Salamon certify that this patient is under my care and that I, or a nurse practitioner or physician's assistant working with me, had a face-to-face encounter that meets the physician face-to-face encounter requirements with this patient on 06/13/2013. The encounter with the patient was in whole, or in part for the following medical condition(s) which is the primary reason for home health care (List medical condition): physical deconditioning   Questions:     The encounter with the patient was in whole, or in part, for the following medical condition, which is the primary reason for home health care:  physical deconditioning   I certify that, based on my findings, the following services are medically necessary home health services:  Physical therapy   Nursing   My clinical findings support the need for the above services:  Unable to leave home safely without assistance and/or assistive device   Further, I certify that my clinical findings support that this patient is homebound due to:  Unable to leave home safely without assistance   Reason for Medically Necessary Home Health Services:  Skilled Nursing- Change/Decline in Patient Status   Skilled Nursing- Changes in Medication/Medication Management   Therapy- Personnel officer, Training and development officer and Stair Training   Therapy- Home Adaptation to [place]  As directed    Questions:     To provide the following care/treatments:  PT   RN       Medication List    STOP taking these medications       hydrochlorothiazide 25 MG tablet  Commonly known as:  HYDRODIURIL      TAKE these medications        acetaminophen 500 MG tablet  Commonly known as:  TYLENOL  Take 1,000 mg by mouth every 6 (six) hours as needed.     ADVAIR DISKUS 250-50 MCG/DOSE Aepb  Generic drug:  Fluticasone-Salmeterol  Inhale 1 puff into the lungs 2 (two) times daily.     atorvastatin 10 MG tablet  Commonly known as:  LIPITOR  Take 10 mg by mouth every other day.     Azelastine HCl 0.15 % Soln  Place 2 sprays into the nose daily.     CALCIUM PO  Take 1 tablet by mouth daily.     diltiazem 120 MG 24 hr capsule  Commonly known as:  DILACOR XR  Take 120 mg by mouth daily.     DRY EYES OP  Apply 1 drop to eye 4 (four) times daily as needed (dry eyes).     furosemide 20 MG tablet  Commonly known as:  LASIX  Take 1 tablet (20 mg total) by mouth daily.     hydrALAZINE 10 MG tablet  Commonly known as:  APRESOLINE  Take 1 tablet (10 mg total) by mouth 3 (three) times daily.  levalbuterol 45 MCG/ACT inhaler  Commonly known as:  XOPENEX HFA  Inhale 1-2 puffs into the lungs every 6 (six) hours as needed for wheezing or shortness of breath.     losartan 50 MG tablet  Commonly known as:  COZAAR  Take 2 tablets (100 mg total) by mouth daily. 2 tablets in the morning and 1 tablet in the evening     montelukast 10 MG tablet  Commonly known as:  SINGULAIR  Take 10 mg by mouth at bedtime.     multivitamin with minerals Tabs tablet  Take 1 tablet by mouth daily.     pantoprazole 40 MG tablet  Commonly known as:  PROTONIX  Take 1 tablet (40 mg total) by mouth daily.     predniSONE 10 MG tablet  Commonly known as:  DELTASONE  Take 5 tabs by mouth daily X 1 day; then 4 tabs by mouth daily X 2 days; then 2 tabs by mouth daily X 3 days; then 1 tab by mouth daily X 3 days and stop prednisone     albuterol 0.63 MG/3ML nebulizer solution  Commonly known as:  ACCUNEB  Take 1 ampule by nebulization every 6 (six) hours as needed for wheezing.     VENTOLIN HFA 108 (90 BASE) MCG/ACT inhaler  Generic drug:   albuterol  Inhale 2 puffs into the lungs every 4 (four) hours as needed for wheezing or shortness of breath.     VITAMIN B-12 PO  Take 1 tablet by mouth daily.     VITAMIN D PO  Take 1 tablet by mouth daily.     VITAMIN E PO  Take 1 capsule by mouth daily.       Allergies  Allergen Reactions  . Asa [Aspirin] Shortness Of Breath    On high doses of ASA  . Penicillins     wheezing  . Keflex [Cephalexin] Rash       Follow-up Information   Follow up with [facility], [place]. Schedule an appointment as soon as possible for a visit in 10 days.   Specialty:  Internal Medicine   Contact information:   [address] [place] [address] [phone]       The results of significant diagnostics from this hospitalization (including imaging, microbiology, ancillary and laboratory) are listed below for reference.    Significant Diagnostic Studies: Dg Chest 2 View  06/11/2013   CLINICAL DATA:  Sternal chest tightness and shortness of breath. History of asthma, hypertension, and cardiomyopathy. Nonsmoker.  EXAM: CHEST  2 VIEW  COMPARISON:  01/06/2011  FINDINGS: Mild hyperinflation.  Moderate thoracic spondylosis.  Midline trachea. Normal heart size with a tortuous atherosclerotic thoracic aorta. No pleural effusion or pneumothorax. No congestive failure. Mild bibasilar volume loss.  IMPRESSION: Cardiomegaly and mild hyperinflation; no acute findings.   Electronically Signed   By: Abigail Miyamoto M.D.   On: 06/11/2013 18:00   Ct Angio Chest Pe W/cm &/or Wo Cm  06/11/2013   CLINICAL DATA:  Shortness of breath with chest pressure. Bronchitis. Question pulmonary embolism.  EXAM: CT ANGIOGRAPHY CHEST WITH CONTRAST  TECHNIQUE: Multidetector CT imaging of the chest was performed using the standard protocol during bolus administration of intravenous contrast. Multiplanar CT image reconstructions including MIPs were obtained to evaluate the vascular anatomy.  CONTRAST:  178mL  OMNIPAQUE IOHEXOL 350 MG/ML SOLN  COMPARISON:  DG CHEST 2 VIEW dated 06/11/2013; DG CHEST 2 VIEW dated 01/06/2011  FINDINGS: The pulmonary arteries are well opacified with contrast.  There is no evidence of acute pulmonary embolism. There is diffuse atherosclerosis of the aorta, great vessels and coronary arteries.  No enlarged mediastinal or hilar lymph nodes are identified. There is mild heterogeneity of the thyroid gland without dominant nodule. There is no significant pleural or pericardial effusion.  The lungs demonstrate mild emphysema with scattered scarring. There is no confluent airspace opacity, endobronchial lesion or suspicious pulmonary nodule. The visualized upper abdomen appears unremarkable.  There are mild degenerative changes throughout the spine. No acute osseous findings are demonstrated.  Review of the MIP images confirms the above findings.  IMPRESSION: 1. No evidence of acute pulmonary embolism. No acute chest findings demonstrated. 2. Diffuse atherosclerosis. 3. Chronic lung disease with mild emphysema.   Electronically Signed   By: Camie Patience M.D.   On: 06/11/2013 20:50    Microbiology: Recent Results (from the past 240 hour(s))  URINE CULTURE     Status: None   Collection Time    06/11/13  7:51 PM      Result Value Range Status   Specimen Description URINE, CLEAN CATCH   Final   Special Requests ADDED 2032   Final   Culture  Setup Time     Final   Value: 06/11/2013 20:53     Performed at [facility] PENDING   Incomplete   Culture     Final   Value: Culture reincubated for better growth     Performed at [facility]   Report Status PENDING   Incomplete     Labs: Basic Metabolic Panel:  Recent Labs Lab 06/11/13 1924 06/11/13 2345 06/12/13 0335 06/13/13 0851  NA 118* 121* 125* 131*  K 2.9* 3.8 3.7 3.6*  CL 83* 85* 88* 97  CO2  --  20 21 21   GLUCOSE 102* 134* 172* 167*  BUN 10 10 10 14   CREATININE 1.20* 1.00 1.00 1.02  CALCIUM   --  8.9 8.5 8.2*  MG  --  1.6  --   --   PHOS  --  2.3  --   --    Liver Function Tests:  Recent Labs Lab 06/11/13 2345 06/12/13 0335  AST 28 25  ALT 16 15  ALKPHOS 67 64  BILITOT 0.4 0.3  PROT 6.0 5.7*  ALBUMIN 3.5 3.3*   CBC:  Recent Labs Lab 06/11/13 1918 06/11/13 1924 06/11/13 2345 06/12/13 0335  WBC 6.2  --  7.4 4.1  NEUTROABS 3.0  --  6.5  --   HGB 8.5* 10.2* 11.0* 10.6*  HCT 23.0* 30.0* 30.7* 30.1*  MCV 75.4*  --  76.6* 77.6*  PLT 234  --  268 259   Cardiac Enzymes:  Recent Labs Lab 06/11/13 2236 06/12/13 0335 06/12/13 0843  TROPONINI <0.30 <0.30 <0.30   CBG:  Recent Labs Lab 06/12/13 0612 06/13/13 0703  GLUCAP 157* 143*    Signed:  Britnay Magnussen  [facility] 06/13/2013, 2:32 PM

## 2013-06-14 LAB — URINE CULTURE: Colony Count: >=100000

## 2013-06-19 DIAGNOSIS — D649 Anemia, unspecified: Secondary | ICD-10-CM | POA: Diagnosis not present

## 2013-06-19 DIAGNOSIS — N390 Urinary tract infection, site not specified: Secondary | ICD-10-CM | POA: Diagnosis not present

## 2013-06-19 DIAGNOSIS — N289 Disorder of kidney and ureter, unspecified: Secondary | ICD-10-CM | POA: Diagnosis not present

## 2013-06-19 DIAGNOSIS — R0602 Shortness of breath: Secondary | ICD-10-CM | POA: Diagnosis not present

## 2013-06-21 ENCOUNTER — Encounter: Payer: Self-pay | Admitting: Cardiology

## 2013-06-21 ENCOUNTER — Ambulatory Visit (INDEPENDENT_AMBULATORY_CARE_PROVIDER_SITE_OTHER): Payer: Medicare Other | Admitting: Cardiology

## 2013-06-21 ENCOUNTER — Telehealth: Payer: Self-pay | Admitting: Cardiology

## 2013-06-21 VITALS — BP 182/94 | HR 82 | Ht <= 58 in | Wt 119.0 lb

## 2013-06-21 DIAGNOSIS — I10 Essential (primary) hypertension: Secondary | ICD-10-CM | POA: Diagnosis not present

## 2013-06-21 DIAGNOSIS — R5383 Other fatigue: Secondary | ICD-10-CM | POA: Diagnosis not present

## 2013-06-21 DIAGNOSIS — R5381 Other malaise: Secondary | ICD-10-CM | POA: Diagnosis not present

## 2013-06-21 DIAGNOSIS — J449 Chronic obstructive pulmonary disease, unspecified: Secondary | ICD-10-CM | POA: Diagnosis not present

## 2013-06-21 DIAGNOSIS — E871 Hypo-osmolality and hyponatremia: Secondary | ICD-10-CM | POA: Diagnosis not present

## 2013-06-21 DIAGNOSIS — R531 Weakness: Secondary | ICD-10-CM

## 2013-06-21 MED ORDER — HYDROCHLOROTHIAZIDE 12.5 MG PO CAPS: 12.5000 mg | ORAL_CAPSULE | Freq: Every day | ORAL | Status: DC

## 2013-06-21 NOTE — Telephone Encounter (Signed)
Went to the hospital and was admitted on 06-11-13. While she was there they put her on Furosemide and she is having problems and side effects from it.Please call her.

## 2013-06-21 NOTE — Assessment & Plan Note (Signed)
Pt was placed on Lasix with hospital discharge due to hypokalemia and hyponatremia on recent hospitalization for COPD exacerbation.  What is not noted is that pt.  Was sick for 1-2 weeks before admit without eating much and a 10 pound wt. Loss.  Today she complains after taking Lasix she begins shaking and feel terrible with weakness like she cannot go.  Usually this is a very active 80 year old.  I have stopped her Lasix and resumed HCTZ at 12.5 mg daily.  She will have repeat labs in 1 week.  She recently had labs with PCP this past Monday.

## 2013-06-21 NOTE — Assessment & Plan Note (Signed)
Recent exacerbation 

## 2013-06-21 NOTE — Assessment & Plan Note (Signed)
Her BP is elevated today, but at home she tells me it is well controlled.  This has been noted on past visits as well.  She will need follow up BP in 2 weeks to make sure stable.

## 2013-06-21 NOTE — Patient Instructions (Signed)
Stop furosemide  Begin half of your 25 mg tab of hydrochlorothiazide to equal 12.5 mg daily Have lab work done this week and then next week to make sure your sodium is stable.  Call if you BP is elevated.

## 2013-06-21 NOTE — Progress Notes (Signed)
06/21/2013   PCP: [name], MD   Chief Complaint  Patient presents with  . Follow-up    BP Check and med change    Primary Cardiologist: Dr. [name]  HPI:  Pt called to be seen today secondary to feeling weak and tired on Lasix.  She is not back to her usual energy.  Recent viral syndrome that she had 10 pound wt loss due to  Anorexia from the syndrome.  In ER she was hypokalemic and hyponatremic and acute renal failure.  Cortisol level was elevated.  She recovered but is not doing well with lasix.  She is on her previous meds except for HCTZ.  She did have labs done with her PCP that I do not have.  Her BP is elevated but at home she has brought in lists that note normal BPs.  Other hx as below.   Allergies  Allergen Reactions  . Asa [Aspirin] Shortness Of Breath    On high doses of ASA  . Penicillins     wheezing  . Keflex [Cephalexin] Rash    Current Outpatient Prescriptions  Medication Sig Dispense Refill  . acetaminophen (TYLENOL) 500 MG tablet Take 1,000 mg by mouth every 6 (six) hours as needed.      Marland Kitchen ADVAIR DISKUS 250-50 MCG/DOSE AEPB Inhale 1 puff into the lungs 2 (two) times daily.       Marland Kitchen albuterol (ACCUNEB) 0.63 MG/3ML nebulizer solution Take 1 ampule by nebulization every 6 (six) hours as needed for wheezing.      . Artificial Tear Ointment (DRY EYES OP) Apply 1 drop to eye 4 (four) times daily as needed (dry eyes).      Marland Kitchen atorvastatin (LIPITOR) 10 MG tablet Take 10 mg by mouth every other day.       . Azelastine HCl 0.15 % SOLN Place 2 sprays into the nose daily.      Marland Kitchen CALCIUM PO Take 1 tablet by mouth daily.      . Cholecalciferol (VITAMIN D PO) Take 1 tablet by mouth daily.      . Cyanocobalamin (VITAMIN B-12 PO) Take 1 tablet by mouth daily.      Marland Kitchen diltiazem (DILACOR XR) 120 MG 24 hr capsule Take 120 mg by mouth daily.      . hydrALAZINE (APRESOLINE) 10 MG tablet Take 1 tablet (10 mg total) by mouth 3 (three) times daily.      Marland Kitchen  levalbuterol (XOPENEX HFA) 45 MCG/ACT inhaler Inhale 1-2 puffs into the lungs every 6 (six) hours as needed for wheezing or shortness of breath.       . losartan (COZAAR) 50 MG tablet Take 2 tablets (100 mg total) by mouth daily. 2 tablets in the morning and 1 tablet in the evening      . montelukast (SINGULAIR) 10 MG tablet Take 10 mg by mouth at bedtime.       . Multiple Vitamin (MULTIVITAMIN WITH MINERALS) TABS tablet Take 1 tablet by mouth daily.      . pantoprazole (PROTONIX) 40 MG tablet Take 1 tablet (40 mg total) by mouth daily.  30 tablet  1  . VENTOLIN HFA 108 (90 BASE) MCG/ACT inhaler Inhale 2 puffs into the lungs every 4 (four) hours as needed for wheezing or shortness of breath.       Marland Kitchen VITAMIN E PO Take 1 capsule by mouth daily.      . hydrochlorothiazide (MICROZIDE) 12.5 MG capsule Take  1 capsule (12.5 mg total) by mouth daily.  90 capsule  3   No current facility-administered medications for this visit.    Past Medical History  Diagnosis Date  . COPD with asthma   . HTN (hypertension)   . Dyslipidemia   . Chronic bronchitis   . Arthritis   . Echocardiogram abnormal 12/05/2010    Moderate concentric left ventricular hypertrophy EF > 25% stage I diastolic dysfunction, left atrium mild to moderate dilatation, moderate mitral annular calcification with trace MR. Trace TR.    Past Surgical History  Procedure Laterality Date  . Vaginal hysterectomy  1966  . Appendectomy  1954  . Cataract extraction, bilateral    . Cholecystectomy  2003  . Skin cancer excision      face and leg    ZDG:LOVFIEP:+ recent viral syndrome thqt led to decreased appetitie, and 10 pound wt loss before being hospitalized with COPD exacerbation Skin:no rashes or ulcers HEENT:no blurred vision, no congestion CV:see HPI PUL:see HPI GI:no diarrhea constipation or melena, no indigestion GU:no hematuria, no dysuria MS:no joint pain, no claudication Neuro:no syncope, no lightheadedness. + weakness  after taking lasix Endo:no diabetes, no thyroid disease  PHYSICAL EXAM BP 182/94  Pulse 82  Ht 4\' 9"  (1.448 m)  Wt 119 lb (53.978 kg)  BMI 25.74 kg/m2 General:Pleasant affect, NAD Skin:Warm and dry, brisk capillary refill HEENT:normocephalic, sclera clear, mucus membranes moist Heart:S1S2 RRR without murmur, gallup, rub or click Lungs:clear without rales, rhonchi, or wheezes PIR:JJOA, non tender, + BS, do not palpate liver spleen or masses Ext:no lower ext edema, 2+ pedal pulses, 2+ radial pulses Neuro:alert and oriented, MAE, follows commands, + facial symmetry  EKG: SR with PAC and PVC, no acute changes otherwise.  ASSESSMENT AND PLAN Weakness Pt was placed on Lasix with hospital discharge due to hypokalemia and hyponatremia on recent hospitalization for COPD exacerbation.  What is not noted is that pt.  Was sick for 1-2 weeks before admit without eating much and a 10 pound wt. Loss.  Today she complains after taking Lasix she begins shaking and feel terrible with weakness like she cannot go.  Usually this is a very active 80 year old.  I have stopped her Lasix and resumed HCTZ at 12.5 mg daily.  She will have repeat labs in 1 week.  She recently had labs with PCP this past Monday.   HTN (hypertension) Her BP is elevated today, but at home she tells me it is well controlled.  This has been noted on past visits as well.  She will need follow up BP in 2 weeks to make sure stable.  COPD (chronic obstructive pulmonary disease) Recent exacerbation.

## 2013-06-21 NOTE — Telephone Encounter (Signed)
Jasmin Lee, PharmD advised pt come in to see an Extender r/t abnormal kidney function.  Returned call to pt.  Stated BP 192/106.  Pt agreeable to come in today for evaluation.  Appt scheduled for today at 1:40pm w/ Cecilie Kicks, NP.  Pt will find a ride to appt as her husband cannot drive long distances.

## 2013-06-21 NOTE — Telephone Encounter (Signed)
Returned call and pt verified x 2.  Pt stated when she was in the hospital last week, they changed her fluid pill to furosemide.  Pt c/o feeling shaky and itching.  Pt c/o vaginal itching.  Pt informed itching may be coming from abx she was given and she may have a yeast infection from taking it.  Pt stated that was stopped by her PCP on Monday.  Pt stated she wants to just stop the furosemide until she sees Dr. [name] in April.  Pt informed that is too long to hold furosemide as her BP may be elevated.  RN advised pt come in for BP check and asked pt to check BP now since pt cannot come in until Friday.  Pt stated it will take her a while to do that and asked that RN call her back.  Pt informed RN will call her back in about 15 mins.  Pt agreeable.  Tommy Medal, PharmD notified in the meantime and will review chart for further instructions.

## 2013-06-23 ENCOUNTER — Encounter: Payer: Medicare Other | Admitting: Cardiology

## 2013-06-26 DIAGNOSIS — Z1212 Encounter for screening for malignant neoplasm of rectum: Secondary | ICD-10-CM | POA: Diagnosis not present

## 2013-06-28 DIAGNOSIS — I10 Essential (primary) hypertension: Secondary | ICD-10-CM | POA: Diagnosis not present

## 2013-07-05 DIAGNOSIS — M549 Dorsalgia, unspecified: Secondary | ICD-10-CM | POA: Diagnosis not present

## 2013-07-05 DIAGNOSIS — E782 Mixed hyperlipidemia: Secondary | ICD-10-CM | POA: Diagnosis not present

## 2013-07-05 DIAGNOSIS — R195 Other fecal abnormalities: Secondary | ICD-10-CM | POA: Diagnosis not present

## 2013-07-05 DIAGNOSIS — I10 Essential (primary) hypertension: Secondary | ICD-10-CM | POA: Diagnosis not present

## 2013-07-06 ENCOUNTER — Other Ambulatory Visit: Payer: Self-pay | Admitting: Cardiology

## 2013-07-06 NOTE — Telephone Encounter (Signed)
Rx was sent to pharmacy electronically. 

## 2013-07-12 ENCOUNTER — Ambulatory Visit: Payer: Medicare Other | Admitting: Cardiology

## 2013-07-28 DIAGNOSIS — J9819 Other pulmonary collapse: Secondary | ICD-10-CM | POA: Diagnosis not present

## 2013-07-28 DIAGNOSIS — R609 Edema, unspecified: Secondary | ICD-10-CM | POA: Diagnosis not present

## 2013-07-28 DIAGNOSIS — I10 Essential (primary) hypertension: Secondary | ICD-10-CM | POA: Diagnosis not present

## 2013-08-01 DIAGNOSIS — E871 Hypo-osmolality and hyponatremia: Secondary | ICD-10-CM | POA: Diagnosis not present

## 2013-08-01 DIAGNOSIS — R609 Edema, unspecified: Secondary | ICD-10-CM | POA: Diagnosis not present

## 2013-08-23 ENCOUNTER — Ambulatory Visit: Payer: Medicare Other | Admitting: Cardiology

## 2013-08-24 DIAGNOSIS — I10 Essential (primary) hypertension: Secondary | ICD-10-CM | POA: Diagnosis not present

## 2013-08-31 DIAGNOSIS — I10 Essential (primary) hypertension: Secondary | ICD-10-CM | POA: Diagnosis not present

## 2013-08-31 DIAGNOSIS — R609 Edema, unspecified: Secondary | ICD-10-CM | POA: Diagnosis not present

## 2013-08-31 DIAGNOSIS — J45909 Unspecified asthma, uncomplicated: Secondary | ICD-10-CM | POA: Diagnosis not present

## 2013-08-31 DIAGNOSIS — E782 Mixed hyperlipidemia: Secondary | ICD-10-CM | POA: Diagnosis not present

## 2013-09-12 ENCOUNTER — Encounter: Payer: Self-pay | Admitting: *Deleted

## 2013-09-15 ENCOUNTER — Ambulatory Visit (INDEPENDENT_AMBULATORY_CARE_PROVIDER_SITE_OTHER): Payer: Medicare Other | Admitting: Cardiology

## 2013-09-15 ENCOUNTER — Encounter: Payer: Self-pay | Admitting: Cardiology

## 2013-09-15 VITALS — BP 152/60 | HR 72 | Ht <= 58 in | Wt 122.3 lb

## 2013-09-15 DIAGNOSIS — J449 Chronic obstructive pulmonary disease, unspecified: Secondary | ICD-10-CM

## 2013-09-15 DIAGNOSIS — I10 Essential (primary) hypertension: Secondary | ICD-10-CM

## 2013-09-15 DIAGNOSIS — E785 Hyperlipidemia, unspecified: Secondary | ICD-10-CM | POA: Diagnosis not present

## 2013-09-15 NOTE — Patient Instructions (Addendum)
Continue current medication   Your physician wants you to follow-up in 12 month Dr [name]-- [address]. You will receive a reminder letter in the mail two months in advance. If you don't receive a letter, please call our office to schedule the follow-up appointment.

## 2013-09-17 NOTE — Assessment & Plan Note (Signed)
On Lipitor follow up PCP time was reversed well-controlled previously.

## 2013-09-17 NOTE — Assessment & Plan Note (Signed)
Again recurrent exacerbations. Stable for now.

## 2013-09-17 NOTE — Assessment & Plan Note (Signed)
Borderline control. She is on some medications. I think she is fine where she is. Not sure how much she is getting from thehydralazine, told that she needs this is a year and her higher blood pressures.

## 2013-09-17 NOTE — Progress Notes (Signed)
PATIENT: Jasmin Lee MRN: [id number]  DOB: 11-Feb-1923   DOV:09/17/2013 PCP: Jasmin Seashore, MD  Clinic Note: Chief Complaint  Patient presents with  . 6 MONTH VISIT    NO CHEST PAIN , SOB -ASTHMA, NO EDEMA,    HPI: Jasmin Lee is a 80 y.o.  female with a PMH below who presents today for six-month followup. She last saw Jasmin Lee in October 2014. She has hypertension, dyslipidemia as well as COPD/chronic bronchitis. She provides is labile difficult control hypertension.  Interval History: She is doing relatively well overall for cardiac standpoint. She spent about 2-1/2 months recovering from a viral bronchitis that infection but did put her in the hospital for 3 days. Dehydrated with palpitation and hypotension. She noted recently been wheezing and difficulty breathing at that time. She now is back to her normal baseline. She is to return also she is 80 years old, but says he is doing very well overall.   She definitely has a discomfort in her chest wall or coughing congestion and upper airway issues, now it is doing better. She denies any significant anginal type chest discomfort with rest or exertion. No PND, orthopnea or edema. No lightheadedness, dizziness, weakness dizziness or syncope. No TIA or RCA symptoms. No melena, hematochezia, hematuria or epistaxis. No claudication.  Past Medical History  Diagnosis Date  . COPD with asthma   . HTN (hypertension)   . Dyslipidemia   . Chronic bronchitis   . Arthritis   . Echocardiogram abnormal 12/05/2010    Moderate concentric left ventricular hypertrophy EF > 44% stage I diastolic dysfunction, left atrium mild to moderate dilatation, moderate mitral annular calcification with trace MR. Trace TR.    Prior Cardiac Evaluation and Past Surgical History: Past Surgical History  Procedure Laterality Date  . Vaginal hysterectomy  1966  . Appendectomy  1954  . Cataract extraction, bilateral    . Cholecystectomy  2003  . Skin cancer  excision      face and leg  . Transthoracic echocardiogram  December 05, 2010    moderate cocentric LVH WITH STAGE 1 IMPARIED DIASTOLIC DYSFUNCTION , NORMAL EF AND NORMAL LV PRESSURES. LEFT ATRIUM WAS ONLY MILD TO MODERATELY DILATED .MOD MITRAL CALCIFICATION WITH ONLY MILD REGURGITATION.,OTHERWISE RELATIVELY NORMAL  . Cardiac event monitor  12/05/10-01/02/11    SINUS RHYTHM    Allergies  Allergen Reactions  . Asa [Aspirin] Shortness Of Breath    On high doses of ASA  . Lisinopril Cough  . Penicillins     wheezing  . Keflex [Cephalexin] Rash    Current Outpatient Prescriptions  Medication Sig Dispense Refill  . acetaminophen (TYLENOL) 500 MG tablet Take 1,000 mg by mouth every 6 (six) hours as needed.      Marland Kitchen ADVAIR DISKUS 250-50 MCG/DOSE AEPB Inhale 1 puff into the lungs 2 (two) times daily.       Marland Kitchen albuterol (ACCUNEB) 0.63 MG/3ML nebulizer solution Take 1 ampule by nebulization every 6 (six) hours as needed for wheezing.      . Artificial Tear Ointment (DRY EYES OP) Apply 1 drop to eye 4 (four) times daily as needed (dry eyes).      Marland Kitchen atorvastatin (LIPITOR) 10 MG tablet Take 10 mg by mouth every other day.       . Azelastine HCl 0.15 % SOLN Place 2 sprays into the nose daily.      Marland Kitchen CALCIUM PO Take 1 tablet by mouth daily.      . Cholecalciferol (VITAMIN  D PO) Take 1 tablet by mouth daily.      . Cyanocobalamin (VITAMIN B-12 PO) Take 1 tablet by mouth daily.      Marland Kitchen DILT-XR 120 MG 24 hr capsule TAKE 1 CAPSULE DAILY  90 capsule  3  . hydrALAZINE (APRESOLINE) 10 MG tablet Take 10 mg by mouth 2 (two) times daily.      . hydrochlorothiazide (MICROZIDE) 12.5 MG capsule Take 1 capsule (12.5 mg total) by mouth daily.  90 capsule  3  . losartan (COZAAR) 50 MG tablet Take 2 tablets (100 mg total) by mouth daily. 2 tablets in the morning and 1 tablet in the evening      . montelukast (SINGULAIR) 10 MG tablet Take 10 mg by mouth at bedtime.       . Multiple Vitamin (MULTIVITAMIN WITH MINERALS) TABS  tablet Take 1 tablet by mouth daily.      . VENTOLIN HFA 108 (90 BASE) MCG/ACT inhaler Inhale 2 puffs into the lungs every 4 (four) hours as needed for wheezing or shortness of breath.       . vitamin C (ASCORBIC ACID) 250 MG tablet Take 250 mg by mouth daily.      Marland Kitchen VITAMIN E PO Take 1 capsule by mouth daily.      . pantoprazole (PROTONIX) 40 MG tablet Take 1 tablet (40 mg total) by mouth daily.  30 tablet  1   No current facility-administered medications for this visit.    History   Social History Narrative  . No narrative on file    ROS: A comprehensive Review of Systems - Negative except Significant arthritic pains and cramping in her hands, likely due to her knitting. She broke a tooth not that long ago had surgical repair. Otherwise finally starting to feel healthy after her prolonged illness.  PHYSICAL EXAM BP 152/60  Pulse 72  Ht 4\' 9"  (1.448 m)  Wt 122 lb 4.8 oz (55.475 kg)  BMI 26.46 kg/m2 General appearance: alert, cooperative, appears stated age, no distress and Pleasant mood and affect. Quick with a joke Neck: no adenopathy, no carotid bruit, no JVD and supple, symmetrical, trachea midline Lungs: clear to auscultation bilaterally, normal percussion bilaterally and Nonlabored, good air movement with only mild interstitial sounds with deep inspiration. Somewhat prolonged expiratory phase. Heart: regular rate and rhythm, S1, S2 normal, no murmur, click, rub or gallop and normal apical impulse Abdomen: soft, non-tender; bowel sounds normal; no masses,  no organomegaly Extremities: No clubbing/cyanosis with trace edema Pulses: 2+ and symmetric Neurologic: Alert and oriented X 3, normal strength and tone. Normal symmetric reflexes. Normal coordination and gait   Adult ECG Report  Rate: 72 ;  Rhythm: normal sinus rhythm and First degree AV block  QRS Axis: -12 ;  PR Interval: 222 ;  QRS Duration: 86 ; QTc: 429  Voltages: Normal  Narrative Interpretation: Otherwise normal EKG  besides first degree AV block  Recent Labs: None since last hospitalization  ASSESSMENT / PLAN: HTN (hypertension) Borderline control. She is on some medications. I think she is fine where she is. Not sure how much she is getting from thehydralazine, told that she needs this is a year and her higher blood pressures.  Dyslipidemia On Lipitor follow up PCP time was reversed well-controlled previously.  COPD (chronic obstructive pulmonary disease) Again recurrent exacerbations. Stable for now.    Orders Placed This Encounter  Procedures  . EKG 12-Lead   Meds ordered this encounter  Medications  . vitamin  C (ASCORBIC ACID) 250 MG tablet    Sig: Take 250 mg by mouth daily.  . hydrALAZINE (APRESOLINE) 10 MG tablet    Sig: Take 10 mg by mouth 2 (two) times daily.    Followup: One year  Rithik Odea W. [name], M.D., M.S. Interventional Cardiology [facility]

## 2013-10-24 DIAGNOSIS — J45909 Unspecified asthma, uncomplicated: Secondary | ICD-10-CM | POA: Diagnosis not present

## 2013-10-24 DIAGNOSIS — J309 Allergic rhinitis, unspecified: Secondary | ICD-10-CM | POA: Diagnosis not present

## 2013-11-17 DIAGNOSIS — Z01419 Encounter for gynecological examination (general) (routine) without abnormal findings: Secondary | ICD-10-CM | POA: Diagnosis not present

## 2013-11-17 DIAGNOSIS — Z1231 Encounter for screening mammogram for malignant neoplasm of breast: Secondary | ICD-10-CM | POA: Diagnosis not present

## 2013-12-14 DIAGNOSIS — R609 Edema, unspecified: Secondary | ICD-10-CM | POA: Diagnosis not present

## 2013-12-18 DIAGNOSIS — N760 Acute vaginitis: Secondary | ICD-10-CM | POA: Diagnosis not present

## 2013-12-21 DIAGNOSIS — M159 Polyosteoarthritis, unspecified: Secondary | ICD-10-CM | POA: Diagnosis not present

## 2013-12-21 DIAGNOSIS — I10 Essential (primary) hypertension: Secondary | ICD-10-CM | POA: Diagnosis not present

## 2013-12-21 DIAGNOSIS — R609 Edema, unspecified: Secondary | ICD-10-CM | POA: Diagnosis not present

## 2013-12-21 DIAGNOSIS — E782 Mixed hyperlipidemia: Secondary | ICD-10-CM | POA: Diagnosis not present

## 2014-01-23 DIAGNOSIS — Z23 Encounter for immunization: Secondary | ICD-10-CM | POA: Diagnosis not present

## 2014-03-10 ENCOUNTER — Other Ambulatory Visit: Payer: Self-pay | Admitting: Cardiology

## 2014-03-14 NOTE — Telephone Encounter (Signed)
E-SENT PHARMACY  

## 2014-04-03 DIAGNOSIS — M791 Myalgia: Secondary | ICD-10-CM | POA: Diagnosis not present

## 2014-04-03 DIAGNOSIS — M25511 Pain in right shoulder: Secondary | ICD-10-CM | POA: Diagnosis not present

## 2014-04-03 DIAGNOSIS — M7711 Lateral epicondylitis, right elbow: Secondary | ICD-10-CM | POA: Diagnosis not present

## 2014-04-03 DIAGNOSIS — M24511 Contracture, right shoulder: Secondary | ICD-10-CM | POA: Diagnosis not present

## 2014-04-03 DIAGNOSIS — M25611 Stiffness of right shoulder, not elsewhere classified: Secondary | ICD-10-CM | POA: Diagnosis not present

## 2014-04-04 ENCOUNTER — Telehealth: Payer: Self-pay | Admitting: Cardiology

## 2014-04-04 NOTE — Telephone Encounter (Signed)
Please call,she wants you to convey some information to Dr [name] for her.

## 2014-04-04 NOTE — Telephone Encounter (Signed)
Pt calling Dr [name] to make him aware that she recently went to a Chiropractor Evan at [facility] in regards to her right arm pain.  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  Pt states she is only seeing the Chiropractor at this time for the right arm pain.  Pt states the Chiropractor advised her to notify her Primary Cardiologist to make him aware of this issue.  Pt states the Chiropractor is currently only treating her with a topical cream at the office visits with him. Pt unsure of the name of the cream.  Informed the pt that I will send Dr [name] and his nurse this message for further review.  Pt verbalized understanding and agrees with this plan.

## 2014-04-04 NOTE — Telephone Encounter (Signed)
Ok - not sure what R arm pain has to do with Cardiology. But & I will keep it under advisement.  [place]

## 2014-04-05 DIAGNOSIS — M25511 Pain in right shoulder: Secondary | ICD-10-CM | POA: Diagnosis not present

## 2014-04-05 DIAGNOSIS — M25611 Stiffness of right shoulder, not elsewhere classified: Secondary | ICD-10-CM | POA: Diagnosis not present

## 2014-04-05 DIAGNOSIS — M791 Myalgia: Secondary | ICD-10-CM | POA: Diagnosis not present

## 2014-04-05 DIAGNOSIS — M7711 Lateral epicondylitis, right elbow: Secondary | ICD-10-CM | POA: Diagnosis not present

## 2014-04-05 DIAGNOSIS — M24511 Contracture, right shoulder: Secondary | ICD-10-CM | POA: Diagnosis not present

## 2014-04-11 DIAGNOSIS — N760 Acute vaginitis: Secondary | ICD-10-CM | POA: Diagnosis not present

## 2014-04-16 DIAGNOSIS — M7711 Lateral epicondylitis, right elbow: Secondary | ICD-10-CM | POA: Diagnosis not present

## 2014-04-16 DIAGNOSIS — M791 Myalgia: Secondary | ICD-10-CM | POA: Diagnosis not present

## 2014-04-16 DIAGNOSIS — M25511 Pain in right shoulder: Secondary | ICD-10-CM | POA: Diagnosis not present

## 2014-04-16 DIAGNOSIS — M24511 Contracture, right shoulder: Secondary | ICD-10-CM | POA: Diagnosis not present

## 2014-04-16 DIAGNOSIS — M25611 Stiffness of right shoulder, not elsewhere classified: Secondary | ICD-10-CM | POA: Diagnosis not present

## 2014-04-24 HISTORY — PX: TRANSTHORACIC ECHOCARDIOGRAM: SHX275

## 2014-04-29 ENCOUNTER — Other Ambulatory Visit: Payer: Self-pay | Admitting: Cardiology

## 2014-04-30 NOTE — Telephone Encounter (Signed)
Rx has been sent to the pharmacy electronically. ° °

## 2014-05-01 ENCOUNTER — Other Ambulatory Visit: Payer: Self-pay | Admitting: Cardiology

## 2014-05-01 NOTE — Telephone Encounter (Signed)
Rx was sent to pharmacy electronically. 

## 2014-05-11 ENCOUNTER — Emergency Department (HOSPITAL_COMMUNITY): Payer: Medicare Other

## 2014-05-11 ENCOUNTER — Encounter (HOSPITAL_COMMUNITY): Payer: Self-pay | Admitting: *Deleted

## 2014-05-11 ENCOUNTER — Observation Stay (HOSPITAL_COMMUNITY)
Admission: EM | Admit: 2014-05-11 | Discharge: 2014-05-12 | Disposition: A | Discharge status: Home / Self Care | Payer: Medicare Other | Attending: Internal Medicine | Admitting: Internal Medicine

## 2014-05-11 ENCOUNTER — Telehealth: Payer: Self-pay | Admitting: Cardiology

## 2014-05-11 DIAGNOSIS — E871 Hypo-osmolality and hyponatremia: Secondary | ICD-10-CM | POA: Diagnosis not present

## 2014-05-11 DIAGNOSIS — J441 Chronic obstructive pulmonary disease with (acute) exacerbation: Secondary | ICD-10-CM | POA: Diagnosis not present

## 2014-05-11 DIAGNOSIS — Z79899 Other long term (current) drug therapy: Secondary | ICD-10-CM | POA: Insufficient documentation

## 2014-05-11 DIAGNOSIS — I10 Essential (primary) hypertension: Secondary | ICD-10-CM | POA: Diagnosis present

## 2014-05-11 DIAGNOSIS — R42 Dizziness and giddiness: Secondary | ICD-10-CM | POA: Diagnosis not present

## 2014-05-11 DIAGNOSIS — E876 Hypokalemia: Secondary | ICD-10-CM | POA: Diagnosis not present

## 2014-05-11 DIAGNOSIS — Z7952 Long term (current) use of systemic steroids: Secondary | ICD-10-CM | POA: Insufficient documentation

## 2014-05-11 DIAGNOSIS — M1990 Unspecified osteoarthritis, unspecified site: Secondary | ICD-10-CM | POA: Diagnosis not present

## 2014-05-11 DIAGNOSIS — R0789 Other chest pain: Principal | ICD-10-CM | POA: Diagnosis present

## 2014-05-11 DIAGNOSIS — R079 Chest pain, unspecified: Secondary | ICD-10-CM | POA: Insufficient documentation

## 2014-05-11 DIAGNOSIS — E785 Hyperlipidemia, unspecified: Secondary | ICD-10-CM | POA: Diagnosis not present

## 2014-05-11 DIAGNOSIS — D649 Anemia, unspecified: Secondary | ICD-10-CM | POA: Insufficient documentation

## 2014-05-11 DIAGNOSIS — Z8701 Personal history of pneumonia (recurrent): Secondary | ICD-10-CM | POA: Diagnosis not present

## 2014-05-11 DIAGNOSIS — Z880 Allergy status to penicillin: Secondary | ICD-10-CM | POA: Diagnosis not present

## 2014-05-11 DIAGNOSIS — J439 Emphysema, unspecified: Secondary | ICD-10-CM | POA: Diagnosis not present

## 2014-05-11 DIAGNOSIS — I429 Cardiomyopathy, unspecified: Secondary | ICD-10-CM | POA: Insufficient documentation

## 2014-05-11 DIAGNOSIS — Z8541 Personal history of malignant neoplasm of cervix uteri: Secondary | ICD-10-CM | POA: Diagnosis not present

## 2014-05-11 DIAGNOSIS — Z85828 Personal history of other malignant neoplasm of skin: Secondary | ICD-10-CM | POA: Diagnosis not present

## 2014-05-11 DIAGNOSIS — R0602 Shortness of breath: Secondary | ICD-10-CM | POA: Diagnosis not present

## 2014-05-11 DIAGNOSIS — J45909 Unspecified asthma, uncomplicated: Secondary | ICD-10-CM | POA: Diagnosis present

## 2014-05-11 HISTORY — DX: Anemia, unspecified: D64.9

## 2014-05-11 HISTORY — DX: Malignant neoplasm of uterus, part unspecified: C55

## 2014-05-11 HISTORY — DX: Other complications of anesthesia, initial encounter: T88.59XA

## 2014-05-11 HISTORY — DX: Pneumonia, unspecified organism: J18.9

## 2014-05-11 HISTORY — DX: Adverse effect of unspecified anesthetic, initial encounter: T41.45XA

## 2014-05-11 HISTORY — DX: Reserved for concepts with insufficient information to code with codable children: IMO0002

## 2014-05-11 HISTORY — DX: Cardiomyopathy, unspecified: I42.9

## 2014-05-11 LAB — OSMOLALITY, URINE: OSMOLALITY UR: 234 mosm/kg — AB (ref 390–1090)

## 2014-05-11 LAB — HEPATIC FUNCTION PANEL
ALBUMIN: 3.7 g/dL (ref 3.5–5.2)
ALK PHOS: 60 U/L (ref 39–117)
ALT: 15 U/L (ref 0–35)
AST: 25 U/L (ref 0–37)
Bilirubin, Direct: <0.2 mg/dL (ref 0.0–0.3)
TOTAL PROTEIN: 6.1 g/dL (ref 6.0–8.3)
Total Bilirubin: 0.3 mg/dL (ref 0.3–1.2)

## 2014-05-11 LAB — BASIC METABOLIC PANEL
ANION GAP: 13 (ref 5–15)
BUN: 8 mg/dL (ref 6–23)
CHLORIDE: 84 meq/L — AB (ref 96–112)
CO2: 25 mEq/L (ref 19–32)
Calcium: 8.8 mg/dL (ref 8.4–10.5)
Creatinine, Ser: 0.79 mg/dL (ref 0.50–1.10)
GFR calc Af Amer: 83 mL/min — ABNORMAL LOW (ref >90)
GFR, EST NON AFRICAN AMERICAN: 72 mL/min — AB (ref >90)
Glucose, Bld: 111 mg/dL — ABNORMAL HIGH (ref 70–99)
POTASSIUM: 3.5 meq/L — AB (ref 3.7–5.3)
Sodium: 122 mEq/L — ABNORMAL LOW (ref 137–147)

## 2014-05-11 LAB — TROPONIN I: Troponin I: <0.3 ng/mL

## 2014-05-11 LAB — CBC
HCT: 28.4 % — ABNORMAL LOW (ref 36.0–46.0)
Hemoglobin: 9.8 g/dL — ABNORMAL LOW (ref 12.0–15.0)
MCH: 27.3 pg (ref 26.0–34.0)
MCHC: 34.5 g/dL (ref 30.0–36.0)
MCV: 79.1 fL (ref 78.0–100.0)
Platelets: 303 10*3/uL (ref 150–400)
RBC: 3.59 MIL/uL — ABNORMAL LOW (ref 3.87–5.11)
RDW: 14.5 % (ref 11.5–15.5)
WBC: 7 10*3/uL (ref 4.0–10.5)

## 2014-05-11 LAB — TSH: TSH: 7.7 u[IU]/mL — ABNORMAL HIGH (ref 0.350–4.500)

## 2014-05-11 LAB — PRO B NATRIURETIC PEPTIDE: Pro B Natriuretic peptide (BNP): 838.4 pg/mL — ABNORMAL HIGH (ref 0–450)

## 2014-05-11 LAB — I-STAT TROPONIN, ED: Troponin i, poc: 0.01 ng/mL (ref 0.00–0.08)

## 2014-05-11 LAB — LIPASE, BLOOD: Lipase: 38 U/L (ref 11–59)

## 2014-05-11 LAB — MAGNESIUM: MAGNESIUM: 1.7 mg/dL (ref 1.5–2.5)

## 2014-05-11 MED ORDER — ATORVASTATIN CALCIUM 10 MG PO TABS: 10.0000 mg | ORAL_TABLET | ORAL | Status: DC

## 2014-05-11 MED ORDER — ENOXAPARIN SODIUM 40 MG/0.4ML ~~LOC~~ SOLN: 40.0000 mg | SUBCUTANEOUS | Status: DC

## 2014-05-11 MED ORDER — ACETAMINOPHEN 325 MG PO TABS: 650.0000 mg | ORAL_TABLET | Freq: Four times a day (QID) | ORAL | Status: DC | PRN

## 2014-05-11 MED ORDER — LOSARTAN POTASSIUM 50 MG PO TABS
100.0000 mg | ORAL_TABLET | Freq: Every day | ORAL | Status: DC
  Administered 2014-05-12: 100 mg via ORAL
  Filled 2014-05-11 (×2): qty 2

## 2014-05-11 MED ORDER — MONTELUKAST SODIUM 10 MG PO TABS
10.0000 mg | ORAL_TABLET | Freq: Every day | ORAL | Status: DC
  Administered 2014-05-11: 10 mg via ORAL
  Filled 2014-05-11: qty 1

## 2014-05-11 MED ORDER — ENOXAPARIN SODIUM 30 MG/0.3ML ~~LOC~~ SOLN
30.0000 mg | SUBCUTANEOUS | Status: DC
  Administered 2014-05-11: 30 mg via SUBCUTANEOUS
  Filled 2014-05-11: qty 0.3

## 2014-05-11 MED ORDER — ONDANSETRON HCL 4 MG PO TABS: 4.0000 mg | ORAL_TABLET | Freq: Four times a day (QID) | ORAL | Status: DC | PRN

## 2014-05-11 MED ORDER — SODIUM CHLORIDE 0.9 % IJ SOLN
3.0000 mL | Freq: Two times a day (BID) | INTRAMUSCULAR | Status: DC
  Administered 2014-05-12: 3 mL via INTRAVENOUS

## 2014-05-11 MED ORDER — MAGNESIUM HYDROXIDE 400 MG/5ML PO SUSP
30.0000 mL | Freq: Every day | ORAL | Status: DC | PRN
  Filled 2014-05-11: qty 30

## 2014-05-11 MED ORDER — MOMETASONE FURO-FORMOTEROL FUM 100-5 MCG/ACT IN AERO
2.0000 | INHALATION_SPRAY | Freq: Two times a day (BID) | RESPIRATORY_TRACT | Status: DC
Start: 2014-05-11 — End: 2014-05-12
  Administered 2014-05-11 – 2014-05-12 (×2): 2 via RESPIRATORY_TRACT
  Filled 2014-05-11: qty 8.8

## 2014-05-11 MED ORDER — HYDRALAZINE HCL 10 MG PO TABS
10.0000 mg | ORAL_TABLET | Freq: Two times a day (BID) | ORAL | Status: DC
  Administered 2014-05-11 – 2014-05-12 (×2): 10 mg via ORAL
  Filled 2014-05-11 (×2): qty 1

## 2014-05-11 MED ORDER — DILTIAZEM HCL ER 120 MG PO CP24
120.0000 mg | ORAL_CAPSULE | Freq: Every day | ORAL | Status: DC
  Administered 2014-05-12: 120 mg via ORAL
  Filled 2014-05-11 (×2): qty 1

## 2014-05-11 MED ORDER — PANTOPRAZOLE SODIUM 40 MG PO TBEC
40.0000 mg | DELAYED_RELEASE_TABLET | Freq: Every day | ORAL | Status: DC
  Administered 2014-05-11: 40 mg via ORAL
  Filled 2014-05-11 (×2): qty 1

## 2014-05-11 MED ORDER — ONDANSETRON HCL 4 MG/2ML IJ SOLN: 4.0000 mg | Freq: Four times a day (QID) | INTRAMUSCULAR | Status: DC | PRN

## 2014-05-11 MED ORDER — ALUM & MAG HYDROXIDE-SIMETH 200-200-20 MG/5ML PO SUSP: 30.0000 mL | Freq: Four times a day (QID) | ORAL | Status: DC | PRN

## 2014-05-11 MED ORDER — ASPIRIN EC 81 MG PO TBEC: 81.0000 mg | DELAYED_RELEASE_TABLET | Freq: Every day | ORAL | Status: DC

## 2014-05-11 MED ORDER — NITROGLYCERIN 0.4 MG SL SUBL
0.4000 mg | SUBLINGUAL_TABLET | SUBLINGUAL | Status: DC | PRN
  Filled 2014-05-11: qty 1

## 2014-05-11 MED ORDER — NITROGLYCERIN 0.4 MG SL SUBL
0.4000 mg | SUBLINGUAL_TABLET | SUBLINGUAL | Status: DC | PRN
  Administered 2014-05-11: 0.4 mg via SUBLINGUAL

## 2014-05-11 MED ORDER — AZELASTINE HCL 0.15 % NA SOLN
2.0000 | Freq: Every day | NASAL | Status: DC
  Filled 2014-05-11: qty 30

## 2014-05-11 MED ORDER — ALBUTEROL SULFATE (2.5 MG/3ML) 0.083% IN NEBU: 3.0000 mL | INHALATION_SOLUTION | Freq: Four times a day (QID) | RESPIRATORY_TRACT | Status: DC | PRN

## 2014-05-11 MED ORDER — LOSARTAN POTASSIUM 50 MG PO TABS
50.0000 mg | ORAL_TABLET | Freq: Every day | ORAL | Status: DC
Start: 2014-05-11 — End: 2014-05-12
  Administered 2014-05-11: 50 mg via ORAL

## 2014-05-11 MED ORDER — POTASSIUM CHLORIDE 10 MEQ/100ML IV SOLN
10.0000 meq | INTRAVENOUS | Status: AC
  Administered 2014-05-11 (×4): 10 meq via INTRAVENOUS
  Filled 2014-05-11 (×2): qty 100

## 2014-05-11 NOTE — ED Notes (Signed)
Patient denies chest pain. States the pressure is much less.  Friend from church is here to take her husband home and he will take her medications and wallet with cash home.

## 2014-05-11 NOTE — ED Notes (Signed)
Patient arrives via ems,  Reported to have 24 hours of pressure in her chest with sob.  Patient sx have worsened.  Patient did have brief period of feeling like she was going to pass out but it resolved.  Patient has hx of asthma and cardiomyopathy.  Patient ekg reported to be unremarkable.  Patient room air pulse ox was 97  But placed on oxygen for comfort.  Patient received nitro x 2 with relief of pressure.  Patient has aspirin allergy.   Patient has taken all morning meds.  She was able to eat breakfast.  Some nausea reported and now resolved.    Patient is alert and oriented.  She continues to have sob and pressure in her chest.  Patient did use her advair this morning.  Patient uses cane for ambulation.

## 2014-05-11 NOTE — Telephone Encounter (Signed)
OK thanks. DH 

## 2014-05-11 NOTE — H&P (Signed)
[facility] History and Physical  Jasmin Lee GNF:[id number] DOB: 1922/11/09 DOA: 05/11/2014  Referring physician: EDP PCP: [name], MD   Chief Complaint: chest pressure  HPI: Jasmin Lee is a 80 y.o. female with h/o htn, asthma presents with continuous left sided chest pressure for several days.  History is somewhat difficult, as pt contradicts herself frequently.  Some "gasping". No wheeze, cough. No change with position, exertion. Not pleuritic. No edema or change in chronic orthopnea. Pain resolved with nitroglycerin. Troponin and EKG ok.  No h/o CAD. Pt is unsure whether she has ever had stress test.  Also had some belching, nausea, poor appetite.  Followed by Dr. [name] for difficult to control HTN. Last echo 2012 reportedly showed LVH. Pro BNP 800. CXR negative. Sodium 122  Review of Systems:  As per HPI. Complete ROS otherwise negative  Past Medical History  Diagnosis Date  . COPD with asthma   . HTN (hypertension)   . Dyslipidemia   . Chronic bronchitis   . Arthritis   . Echocardiogram abnormal 12/05/2010    Moderate concentric left ventricular hypertrophy EF > 84% stage I diastolic dysfunction, left atrium mild to moderate dilatation, moderate mitral annular calcification with trace MR. Trace TR.  . Cardiomyopathy    Past Surgical History  Procedure Laterality Date  . Vaginal hysterectomy  1966  . Appendectomy  1954  . Cataract extraction, bilateral    . Cholecystectomy  2003  . Skin cancer excision      face and leg  . Transthoracic echocardiogram  December 05, 2010    moderate cocentric LVH WITH STAGE 1 IMPARIED DIASTOLIC DYSFUNCTION , NORMAL EF AND NORMAL LV PRESSURES. LEFT ATRIUM WAS ONLY MILD TO MODERATELY DILATED .MOD MITRAL CALCIFICATION WITH ONLY MILD REGURGITATION.,OTHERWISE RELATIVELY NORMAL  . Cardiac event monitor  12/05/10-01/02/11    SINUS RHYTHM   Social History:  reports that she has never smoked. She has never used smokeless  tobacco. She reports that she does not drink alcohol or use illicit drugs.  Allergies  Allergen Reactions  . Asa [Aspirin] Shortness Of Breath    On high doses of ASA  . Lisinopril Cough  . Penicillins     wheezing  . Keflex [Cephalexin] Rash    Family History  Problem Relation Age of Onset  . Heart attack Mother   . Cancer Father   . Heart attack Maternal Grandmother   . Heart disease Son   . Asthma Daughter      Prior to Admission medications   Medication Sig Start Date End Date Taking? Authorizing Provider  acetaminophen (TYLENOL) 500 MG tablet Take 1,000 mg by mouth every 6 (six) hours as needed.   Yes Historical Provider, MD  ADVAIR DISKUS 250-50 MCG/DOSE AEPB Inhale 1 puff into the lungs 2 (two) times daily.  01/26/13  Yes Historical Provider, MD  albuterol (ACCUNEB) 0.63 MG/3ML nebulizer solution Take 1 ampule by nebulization every 6 (six) hours as needed for wheezing.   Yes Historical Provider, MD  Artificial Tear Ointment (DRY EYES OP) Apply 1 drop to eye 4 (four) times daily as needed (dry eyes).   Yes Historical Provider, MD  atorvastatin (LIPITOR) 10 MG tablet Take 10 mg by mouth every other day.  01/30/13  Yes Historical Provider, MD  Azelastine HCl 0.15 % SOLN Place 2 sprays into the nose daily. 12/03/12  Yes Historical Provider, MD  CALCIUM PO Take 1 tablet by mouth daily.   Yes Historical Provider, MD  Cholecalciferol (VITAMIN D  PO) Take 1 tablet by mouth daily.   Yes Historical Provider, MD  Cyanocobalamin (VITAMIN B-12 PO) Take 1 tablet by mouth daily.   Yes Historical Provider, MD  DILT-XR 120 MG 24 hr capsule TAKE 1 CAPSULE DAILY 07/06/13  Yes [name], MD  hydrALAZINE (APRESOLINE) 10 MG tablet Take 1 tablet (10 mg total) by mouth 2 (two) times daily. Needs appointment before anymore refills 04/30/14  Yes [name], MD  hydrochlorothiazide (MICROZIDE) 12.5 MG capsule TAKE 1 CAPSULE DAILY 05/01/14  Yes [name], NP  losartan (COZAAR) 50 MG tablet Take  2 tablets (100 mg total) by mouth daily. 2 tablets in the morning and 1 tablet in the evening 06/13/13  Yes [name], MD  montelukast (SINGULAIR) 10 MG tablet Take 10 mg by mouth at bedtime.  12/03/12  Yes Historical Provider, MD  Multiple Vitamin (MULTIVITAMIN WITH MINERALS) TABS tablet Take 1 tablet by mouth daily.   Yes Historical Provider, MD  VENTOLIN HFA 108 (90 BASE) MCG/ACT inhaler Inhale 2 puffs into the lungs every 4 (four) hours as needed for wheezing or shortness of breath.  01/26/13  Yes Historical Provider, MD  pantoprazole (PROTONIX) 40 MG tablet Take 1 tablet (40 mg total) by mouth daily. Patient not taking: Reported on 05/11/2014 06/13/13   [name], MD  vitamin C (ASCORBIC ACID) 250 MG tablet Take 250 mg by mouth daily.    Historical Provider, MD   Physical Exam: Filed Vitals:   05/11/14 1300 05/11/14 1309 05/11/14 1330 05/11/14 1409  BP: 140/70 138/64 147/71 183/78  Pulse: 71 84 72 85  Temp:  98 F (36.7 C)  98.7 F (37.1 C)  TempSrc:  Oral  Oral  Resp: 16 15 23 19   Height:      Weight:    49.669 kg (109 lb 8 oz)  SpO2: 98% 98% 97% 100%    Wt Readings from Last 3 Encounters:  05/11/14 49.669 kg (109 lb 8 oz)  09/15/13 55.475 kg (122 lb 4.8 oz)  06/21/13 53.978 kg (119 lb)  BP 130/71 mmHg  Pulse 76  Temp(Src) 98.1 F (36.7 C) (Oral)  Resp 19  Ht 4' 10.5" (1.486 m)  Wt 49.669 kg (109 lb 8 oz)  BMI 22.49 kg/m2  SpO2 100%  General Appearance:    Alert, cooperative, no distress, appears stated age. Frequent belching  Head:    Normocephalic, without obvious abnormality, atraumatic  Eyes:    PERRL, conjunctiva/corneas clear, EOM's intact,   Nose:   Nares normal, septum midline, mucosa normal, no drainage    or sinus tenderness  Throat:   Lips, mucosa, and tongue normal; teeth and gums normal  Neck:   Supple, symmetrical, trachea midline, no adenopathy;    thyroid:  no enlargement/tenderness/nodules; no carotid   bruit or JVD  Back:     Symmetric, no  curvature, ROM normal, no CVA tenderness  Lungs:     Clear to auscultation bilaterally, respirations unlabored  Chest Wall:    No tenderness or deformity   Heart:    Regular rate and rhythm, S1 and S2 normal, no murmur, rub   or gallop  Abdomen:     Soft, non-tender, bowel sounds active all four quadrants,    no masses, no organomegaly  Genitalia:    deferred  Rectal:    deferred  Extremities:   Extremities normal, atraumatic, no cyanosis or edema  Pulses:   2+ and symmetric all extremities  Skin:   Skin color,  texture, turgor normal, no rashes or lesions  Lymph nodes:   Cervical, supraclavicular, and axillary nodes normal  Neurologic:   CNII-XII intact, normal strength, sensation and reflexes    throughout    Psych: normal affect        Labs on Admission:  Basic Metabolic Panel:  Recent Labs Lab 05/11/14 1040  NA 122*  K 3.5*  CL 84*  CO2 25  GLUCOSE 111*  BUN 8  CREATININE 0.79  CALCIUM 8.8   Liver Function Tests: No results for input(s): AST, ALT, ALKPHOS, BILITOT, PROT, ALBUMIN in the last 168 hours. No results for input(s): LIPASE, AMYLASE in the last 168 hours. No results for input(s): AMMONIA in the last 168 hours. CBC:  Recent Labs Lab 05/11/14 1040  WBC 7.0  HGB 9.8*  HCT 28.4*  MCV 79.1  PLT 303   Cardiac Enzymes:  Recent Labs Lab 05/11/14 1040  TROPONINI <0.30    BNP (last 3 results)  Recent Labs  05/11/14 1040  PROBNP 838.4*   CBG: No results for input(s): GLUCAP in the last 168 hours.  Radiological Exams on Admission: Dg Chest Port 1 View  05/11/2014   CLINICAL DATA:  Chest pain and difficulty breathing for 2 days  EXAM: PORTABLE CHEST - 1 VIEW  COMPARISON:  Chest radiograph and chest CT June 11, 2013  FINDINGS: There is underlying emphysematous change. There is mild scarring in the left base. There is no edema or consolidation. The heart size and pulmonary vascularity are normal. There is atherosclerotic change in the aorta. No  adenopathy.  IMPRESSION: Underlying emphysematous change.  No edema or consolidation.   Electronically Signed   By: Lowella Grip M.D.   On: 05/11/2014 11:27    EKG: tracing reviewed.  NSR  Assessment/Plan  Principal Problem:   Chest pressure: atypical for ACS. Has some GI complaints. Will observe on tele, r/o MI. Check echo. Start empiric ppi. Allergic to ASA. Discussed with Dr. [name]. Ok to discharge if pt rules out. May f/u with him as outpation to consider stress test Active Problems:   HTN (hypertension)   Hyponatremia: has had in the past, thiazide stopped, but patient back on it. Will d/c. Check TSH. Serum and urine osm.   Hypokalemia: replete   Extrinsic asthma stable.   Code Status: full Family Communication: at bedside Disposition Plan: obs  Time spent: 60 min  [facility]

## 2014-05-11 NOTE — Telephone Encounter (Signed)
Per the answering service:  Hard for her to breathe,should she go to the ER? Please call asap.

## 2014-05-11 NOTE — Telephone Encounter (Signed)
Spoke to patient. She states she feels a heaviness or weight in her chest. She states she has difficult catching her breathe.  She states has taken her morning medications. She states she is calling EMS to take her to [place]. She and husband can not drive. RN informed her that would be best. Notified card [facility]

## 2014-05-11 NOTE — ED Provider Notes (Signed)
CSN: [id number]     Arrival date & time 05/11/14  1030 History   First MD Initiated Contact with Patient 05/11/14 1035     Chief Complaint  Patient presents with  . Chest Pain  . Shortness of Breath     (Consider location/radiation/quality/duration/timing/severity/associated sxs/prior Treatment) HPI Comments: 80 year old female with history of cardiomyopathy, COPD, high blood pressure, lipids presents with fairly constant chest pressure for the past 2 days. Patient had nitroglycerin with relief of pressure patient has aspirin allergy. Patient is taken her morning medications. Patient denies similar history at least not this severe or prolonged period patient had brief episode lightheadedness that has resolved. Patient follows with Dr. Selena Batten cardiologist. Nothing specifically worsened or constant chest pressure. No diaphoresis. No exertional component  Patient is a 80 y.o. female presenting with chest pain and shortness of breath. The history is provided by the patient.  Chest Pain Associated symptoms: shortness of breath   Associated symptoms: no abdominal pain, no back pain, no fever, no headache and not vomiting   Shortness of Breath Associated symptoms: chest pain   Associated symptoms: no abdominal pain, no fever, no headaches, no neck pain, no rash and no vomiting     Past Medical History  Diagnosis Date  . COPD with asthma   . HTN (hypertension)   . Dyslipidemia   . Chronic bronchitis   . Arthritis   . Echocardiogram abnormal 12/05/2010    Moderate concentric left ventricular hypertrophy EF > 66% stage I diastolic dysfunction, left atrium mild to moderate dilatation, moderate mitral annular calcification with trace MR. Trace TR.  . Cardiomyopathy    Past Surgical History  Procedure Laterality Date  . Vaginal hysterectomy  1966  . Appendectomy  1954  . Cataract extraction, bilateral    . Cholecystectomy  2003  . Skin cancer excision      face and leg  . Transthoracic  echocardiogram  December 05, 2010    moderate cocentric LVH WITH STAGE 1 IMPARIED DIASTOLIC DYSFUNCTION , NORMAL EF AND NORMAL LV PRESSURES. LEFT ATRIUM WAS ONLY MILD TO MODERATELY DILATED .MOD MITRAL CALCIFICATION WITH ONLY MILD REGURGITATION.,OTHERWISE RELATIVELY NORMAL  . Cardiac event monitor  12/05/10-01/02/11    SINUS RHYTHM   Family History  Problem Relation Age of Onset  . Heart attack Mother   . Cancer Father   . Heart attack Maternal Grandmother   . Heart disease Son   . Asthma Daughter    History  Substance Use Topics  . Smoking status: Never Smoker   . Smokeless tobacco: Never Used  . Alcohol Use: No   OB History    No data available     Review of Systems  Constitutional: Positive for appetite change. Negative for fever and chills.  HENT: Negative for congestion.   Eyes: Negative for visual disturbance.  Respiratory: Positive for shortness of breath.   Cardiovascular: Positive for chest pain. Negative for leg swelling.  Gastrointestinal: Negative for vomiting and abdominal pain.  Genitourinary: Negative for dysuria and flank pain.  Musculoskeletal: Negative for back pain, neck pain and neck stiffness.  Skin: Negative for rash.  Neurological: Positive for light-headedness. Negative for headaches.      Allergies  Asa; Lisinopril; Penicillins; and Keflex  Home Medications   Prior to Admission medications   Medication Sig Start Date End Date Taking? Authorizing Provider  acetaminophen (TYLENOL) 500 MG tablet Take 1,000 mg by mouth every 6 (six) hours as needed.   Yes Historical Provider, MD  ADVAIR Randalyn Rhea  250-50 MCG/DOSE AEPB Inhale 1 puff into the lungs 2 (two) times daily.  01/26/13  Yes Historical Provider, MD  albuterol (ACCUNEB) 0.63 MG/3ML nebulizer solution Take 1 ampule by nebulization every 6 (six) hours as needed for wheezing.   Yes Historical Provider, MD  Artificial Tear Ointment (DRY EYES OP) Apply 1 drop to eye 4 (four) times daily as needed (dry eyes).    Yes Historical Provider, MD  atorvastatin (LIPITOR) 10 MG tablet Take 10 mg by mouth every other day.  01/30/13  Yes Historical Provider, MD  Azelastine HCl 0.15 % SOLN Place 2 sprays into the nose daily. 12/03/12  Yes Historical Provider, MD  CALCIUM PO Take 1 tablet by mouth daily.   Yes Historical Provider, MD  Cholecalciferol (VITAMIN D PO) Take 1 tablet by mouth daily.   Yes Historical Provider, MD  Cyanocobalamin (VITAMIN B-12 PO) Take 1 tablet by mouth daily.   Yes Historical Provider, MD  DILT-XR 120 MG 24 hr capsule TAKE 1 CAPSULE DAILY 07/06/13  Yes [name], MD  hydrALAZINE (APRESOLINE) 10 MG tablet Take 1 tablet (10 mg total) by mouth 2 (two) times daily. Needs appointment before anymore refills 04/30/14  Yes [name], MD  hydrochlorothiazide (MICROZIDE) 12.5 MG capsule TAKE 1 CAPSULE DAILY 05/01/14  Yes [name], NP  losartan (COZAAR) 50 MG tablet Take 2 tablets (100 mg total) by mouth daily. 2 tablets in the morning and 1 tablet in the evening 06/13/13  Yes [name], MD  montelukast (SINGULAIR) 10 MG tablet Take 10 mg by mouth at bedtime.  12/03/12  Yes Historical Provider, MD  Multiple Vitamin (MULTIVITAMIN WITH MINERALS) TABS tablet Take 1 tablet by mouth daily.   Yes Historical Provider, MD  VENTOLIN HFA 108 (90 BASE) MCG/ACT inhaler Inhale 2 puffs into the lungs every 4 (four) hours as needed for wheezing or shortness of breath.  01/26/13  Yes Historical Provider, MD  losartan (COZAAR) 50 MG tablet TAKE 2 TABLETS IN THE MORNING AND TAKE 1 TABLET IN THE EVENING Patient not taking: Reported on 05/11/2014 03/14/14   [name], MD  pantoprazole (PROTONIX) 40 MG tablet Take 1 tablet (40 mg total) by mouth daily. Patient not taking: Reported on 05/11/2014 06/13/13   [name], MD  vitamin C (ASCORBIC ACID) 250 MG tablet Take 250 mg by mouth daily.    Historical Provider, MD   BP 164/57 mmHg  Pulse 67  Temp(Src) 97.8 F (36.6 C) (Oral)  Resp 13  Ht 4'  10.5" (1.486 m)  Wt 115 lb (52.164 kg)  BMI 23.62 kg/m2  SpO2 100% Physical Exam  Constitutional: She is oriented to person, place, and time. She appears well-developed and well-nourished. No distress.  HENT:  Head: Normocephalic and atraumatic.  Eyes: Right eye exhibits no discharge. Left eye exhibits no discharge.  Neck: Normal range of motion. Neck supple. No tracheal deviation present.  Cardiovascular: Normal rate and regular rhythm.   Pulmonary/Chest: Effort normal and breath sounds normal.  Abdominal: Soft. She exhibits no distension. There is no tenderness. There is no guarding.  Musculoskeletal: She exhibits no edema or tenderness.  Neurological: She is alert and oriented to person, place, and time.  Skin: Skin is warm. No rash noted.  Psychiatric: She has a normal mood and affect.  Nursing note and vitals reviewed.   ED Course  Procedures (including critical care time) Labs Review Labs Reviewed  CBC - Abnormal; Notable for the following:    RBC 3.59 (*)  Hemoglobin 9.8 (*)    HCT 28.4 (*)    All other components within normal limits  BASIC METABOLIC PANEL - Abnormal; Notable for the following:    Sodium 122 (*)    Potassium 3.5 (*)    Chloride 84 (*)    Glucose, Bld 111 (*)    GFR calc non Af Amer 72 (*)    GFR calc Af Amer 83 (*)    All other components within normal limits  PRO B NATRIURETIC PEPTIDE - Abnormal; Notable for the following:    Pro B Natriuretic peptide (BNP) 838.4 (*)    All other components within normal limits  TROPONIN I  I-STAT TROPOININ, ED    Imaging Review Dg Chest Port 1 View  05/11/2014   CLINICAL DATA:  Chest pain and difficulty breathing for 2 days  EXAM: PORTABLE CHEST - 1 VIEW  COMPARISON:  Chest radiograph and chest CT June 11, 2013  FINDINGS: There is underlying emphysematous change. There is mild scarring in the left base. There is no edema or consolidation. The heart size and pulmonary vascularity are normal. There is  atherosclerotic change in the aorta. No adenopathy.  IMPRESSION: Underlying emphysematous change.  No edema or consolidation.   Electronically Signed   By: Lowella Grip M.D.   On: 05/11/2014 11:27     EKG Interpretation None     EKG reviewed sinus rhythm, no acute ST elevation or ST depression, borderline left axis, normal QT. MDM   Final diagnoses:  Hyponatremia  Chest pressure   Patient known cardiomyopathy presents with significant chest pressure that resolved with nitroglycerin prior to arrival, very mild symptoms currently. Nitrol be ordered when necessary. Patient is aspirin allergy. With age, cardiopathy history and symptoms worse than previous plan for observation telemetry. [facility] hospitalist page. Patient has low sodium, patient has had low-sodium the past, chest x-ray reviewed no acute findings. Plan for small fluid bolus.  Patient improved on recheck, plan for observation for hyponatremia and chest pressure. Discussed return at hospitalist. The patients results and plan were reviewed and discussed.   Any x-rays performed were personally reviewed by myself.   Differential diagnosis were considered with the presenting HPI.  Medications  nitroGLYCERIN (NITROSTAT) SL tablet 0.4 mg (not administered)  nitroGLYCERIN (NITROSTAT) SL tablet 0.4 mg (not administered)    Filed Vitals:   05/11/14 1037 05/11/14 1100  BP: 151/72 164/57  Pulse: 71 67  Temp: 97.8 F (36.6 C)   TempSrc: Oral   Resp: 16 13  Height: 4' 10.5" (1.486 m)   Weight: 115 lb (52.164 kg)   SpO2: 99% 100%    Final diagnoses:  Hyponatremia  Chest pressure    Admission/ observation were discussed with the admitting physician, patient and/or family and they are comfortable with the plan.      Mariea Clonts, MD 05/11/14 [phone]

## 2014-05-12 DIAGNOSIS — R0789 Other chest pain: Secondary | ICD-10-CM | POA: Diagnosis not present

## 2014-05-12 DIAGNOSIS — J45909 Unspecified asthma, uncomplicated: Secondary | ICD-10-CM | POA: Diagnosis present

## 2014-05-12 DIAGNOSIS — I359 Nonrheumatic aortic valve disorder, unspecified: Secondary | ICD-10-CM | POA: Diagnosis not present

## 2014-05-12 LAB — BASIC METABOLIC PANEL
ANION GAP: 12 (ref 5–15)
BUN: 9 mg/dL (ref 6–23)
CALCIUM: 8.4 mg/dL (ref 8.4–10.5)
CHLORIDE: 88 meq/L — AB (ref 96–112)
CO2: 25 mEq/L (ref 19–32)
CREATININE: 0.87 mg/dL (ref 0.50–1.10)
GFR calc Af Amer: 66 mL/min — ABNORMAL LOW (ref >90)
GFR calc non Af Amer: 57 mL/min — ABNORMAL LOW (ref >90)
Glucose, Bld: 90 mg/dL (ref 70–99)
Potassium: 3.9 mEq/L (ref 3.7–5.3)
Sodium: 125 mEq/L — ABNORMAL LOW (ref 137–147)

## 2014-05-12 LAB — TROPONIN I

## 2014-05-12 LAB — OSMOLALITY: Osmolality: 253 mOsm/kg — ABNORMAL LOW (ref 275–300)

## 2014-05-12 NOTE — Discharge Summary (Signed)
Jasmin Lee, 80 y.o., DOB 1922/06/14, MRN [id number]. Admission date: 05/11/2014 Discharge Date 05/12/2014 Primary MD [name], MD Admitting Physician [name], MD  Admission Diagnosis  Hyponatremia [E87.1] Chest pressure [R07.89]  Discharge Diagnosis   Principal Problem:   Chest pressure Active Problems:   HTN (hypertension)   Hyponatremia   Hypokalemia   Extrinsic asthma      Past Medical History  Diagnosis Date  . COPD with asthma   . HTN (hypertension)   . Dyslipidemia   . Chronic bronchitis "2-3 times/yr"  . Echocardiogram abnormal 12/05/2010    Moderate concentric left ventricular hypertrophy EF > 32% stage I diastolic dysfunction, left atrium mild to moderate dilatation, moderate mitral annular calcification with trace MR. Trace TR.  . Cardiomyopathy   . Uterine cancer   . Squamous carcinoma     "left face and thigh"  . Complication of anesthesia     "started flinching at start of hysterectomy; they had to give me more anesthetic"  . Pneumonia "several times"  . Anemia   . Arthritis     "in my legs and feet" (05/11/2014)    Past Surgical History  Procedure Laterality Date  . Vaginal hysterectomy  1966  . Appendectomy  1954  . Cataract extraction w/ intraocular lens  implant, bilateral Bilateral   . Cholecystectomy  2003  . Squamous cell carcinoma excision Left     face and thigh  . Transthoracic echocardiogram  December 05, 2010    moderate cocentric LVH WITH STAGE 1 IMPARIED DIASTOLIC DYSFUNCTION , NORMAL EF AND NORMAL LV PRESSURES. LEFT ATRIUM WAS ONLY MILD TO MODERATELY DILATED .MOD MITRAL CALCIFICATION WITH ONLY MILD REGURGITATION.,OTHERWISE RELATIVELY NORMAL  . Cardiac event monitor  12/05/10-01/02/11    SINUS RHYTHM  . Dilation and curettage of uterus  1966  . Breast biopsy Left     "it was nothing"    Admission history of present illness/brief narrative: Jasmin Lee is a 80 y.o. female with h/o htn, asthma presents with  continuous left sided chest pressure for several days.  No wheeze, cough. No change with position, exertion. Not pleuritic.Marland Kitchen Pain resolved with nitroglycerin. Troponin and EKG on admission are ok. No h/o CAD. Pt is unsure whether she has ever had stress test. Also had some belching, nausea, poor appetite. Followed by Dr. [name] for difficult to control HTN. Last echo 2012 reportedly showed LVH. Pro BNP 800. CXR negative. Sodium 122, patient is known to have chronic hyponatremia with baseline sodium between 120 and 125, patient was admitted for further evaluation for her chest pain, no significant events on telemetry, had negative cardiac enzymes 3, denies any chest pain this a.m. , add 2-D echo done which did not show any wall motion abnormality, EF 70%, and grade 1 diastolic dysfunction.   Hospital Course See H&P, Labs, Consult and Test reports for all details in brief, patient was admitted for **  Principal Problem:   Chest pressure Active Problems:   HTN (hypertension)   Hyponatremia   Hypokalemia   Extrinsic asthma  Chest pain: atypical for ACS. Has some GI manifestations., no events overnight on telemetry, cardiac enzymes negative 3, admitting physician discussed with Dr. [name], who recommended outpatient follow-up once patient ruled out.  Hyponatremia; - chronic, stable, at baseline, sodium at day of discharge is 125.  Hypokalemia - Improved, sodium is 125 at day of discharge - Discontinue thiazide on discharge.  Hypertension -Continue home medication on discharge      Significant Tests:  See full reports for all details    Dg Chest [facility]  05/11/2014   CLINICAL DATA:  Chest pain and difficulty breathing for 2 days  EXAM: PORTABLE CHEST - 1 VIEW  COMPARISON:  Chest radiograph and chest CT June 11, 2013  FINDINGS: There is underlying emphysematous change. There is mild scarring in the left base. There is no edema or consolidation. The heart size and pulmonary  vascularity are normal. There is atherosclerotic change in the aorta. No adenopathy.  IMPRESSION: Underlying emphysematous change.  No edema or consolidation.   Electronically Signed   By: Lowella Grip M.D.   On: 05/11/2014 11:27     Today   Subjective:   Jasmin Lee today has no headache,no chest abdominal pain,no new weakness tingling or numbness, feels much better wants to go home today.   Objective:   Blood pressure 141/66, pulse 81, temperature 98.2 F (36.8 C), temperature source Oral, resp. rate 16, height 4' 10.5" (1.486 m), weight 50.712 kg (111 lb 12.8 oz), SpO2 100 %.  Intake/Output Summary (Last 24 hours) at 05/12/14 1504 Last data filed at 05/12/14 0900  Gross per 24 hour  Intake    240 ml  Output    900 ml  Net   -660 ml    Exam Awake Alert, Oriented *3, No new F.N deficits, Normal affect [place].AT,PERRAL Supple Neck,No JVD, No cervical lymphadenopathy appriciated.  Symmetrical Chest wall movement, Good air movement bilaterally, CTAB RRR,No Gallops,Rubs or new Murmurs, No Parasternal Heave +ve B.Sounds, Abd Soft, Non tender, No organomegaly appriciated, No rebound -guarding or rigidity. No Cyanosis, Clubbing or edema, No new Rash or bruise  Data Review     CBC w Diff:  Lab Results  Component Value Date   WBC 7.0 05/11/2014   HGB 9.8* 05/11/2014   HCT 28.4* 05/11/2014   PLT 303 05/11/2014   LYMPHOPCT 11* 06/11/2013   MONOPCT 2* 06/11/2013   EOSPCT 0 06/11/2013   BASOPCT 0 06/11/2013   CMP:  Lab Results  Component Value Date   NA 125* 05/12/2014   K 3.9 05/12/2014   CL 88* 05/12/2014   CO2 25 05/12/2014   BUN 9 05/12/2014   CREATININE 0.87 05/12/2014   PROT 6.1 05/11/2014   ALBUMIN 3.7 05/11/2014   BILITOT 0.3 05/11/2014   ALKPHOS 60 05/11/2014   AST 25 05/11/2014   ALT 15 05/11/2014  .  Micro Results No results found for this or any previous visit (from the past 240 hour(s)).   Discharge Instructions          Follow-up  Information    Follow up with [facility], MD In 1 week.   Specialty:  Internal Medicine   Contact information:   [address] [place] [address] [phone]       Follow up with [facility], Leonie Green, MD. Schedule an appointment as soon as possible for a visit in 1 week.   Specialty:  Cardiology   Contact information:   [address] [place] [address] [phone]       Discharge Medications     Medication List    STOP taking these medications        hydrochlorothiazide 12.5 MG capsule  Commonly known as:  MICROZIDE      TAKE these medications        acetaminophen 500 MG tablet  Commonly known as:  TYLENOL  Take 1,000 mg by mouth every 6 (six) hours as needed.  ADVAIR DISKUS 250-50 MCG/DOSE Aepb  Generic drug:  Fluticasone-Salmeterol  Inhale 1 puff into the lungs 2 (two) times daily.     atorvastatin 10 MG tablet  Commonly known as:  LIPITOR  Take 10 mg by mouth every other day.     Azelastine HCl 0.15 % Soln  Place 2 sprays into the nose daily.     CALCIUM PO  Take 1 tablet by mouth daily.     DILT-XR 120 MG 24 hr capsule  Generic drug:  diltiazem  TAKE 1 CAPSULE DAILY     DRY EYES OP  Apply 1 drop to eye 4 (four) times daily as needed (dry eyes).     hydrALAZINE 10 MG tablet  Commonly known as:  APRESOLINE  Take 1 tablet (10 mg total) by mouth 2 (two) times daily. Needs appointment before anymore refills     losartan 50 MG tablet  Commonly known as:  COZAAR  Take 2 tablets (100 mg total) by mouth daily. 2 tablets in the morning and 1 tablet in the evening     montelukast 10 MG tablet  Commonly known as:  SINGULAIR  Take 10 mg by mouth at bedtime.     multivitamin with minerals Tabs tablet  Take 1 tablet by mouth daily.     pantoprazole 40 MG tablet  Commonly known as:  PROTONIX  Take 1 tablet (40 mg total) by mouth daily.     albuterol 0.63 MG/3ML nebulizer solution  Commonly known as:  ACCUNEB   Take 1 ampule by nebulization every 6 (six) hours as needed for wheezing.     VENTOLIN HFA 108 (90 BASE) MCG/ACT inhaler  Generic drug:  albuterol  Inhale 2 puffs into the lungs every 4 (four) hours as needed for wheezing or shortness of breath.     VITAMIN B-12 PO  Take 1 tablet by mouth daily.     vitamin C 250 MG tablet  Commonly known as:  ASCORBIC ACID  Take 250 mg by mouth daily.     VITAMIN D PO  Take 1 tablet by mouth daily.         Total Time in preparing paper work, data evaluation and todays exam - 35 minutes  Cambelle Suchecki M.D on 05/12/2014 at 3:04 PM  [facility]  [phone]

## 2014-05-12 NOTE — Discharge Instructions (Signed)
Follow with Primary MD [name], MD in 7 days   Get CBC, CMP, 2 view Chest X ray checked  by Primary MD next visit.    Activity: As tolerated with Full fall precautions use walker/cane & assistance as needed   Disposition Home    Diet: Heart Healthy  , with feeding assistance and aspiration precautions as needed.  For Heart failure patients - Check your Weight same time everyday, if you gain over 2 pounds, or you develop in leg swelling, experience more shortness of breath or chest pain, call your Primary MD immediately. Follow Cardiac Low Salt Diet and 1.8 lit/day fluid restriction.   On your next visit with your primary care physician please Get Medicines reviewed and adjusted.   Please request your Prim.MD to go over all Hospital Tests and Procedure/Radiological results at the follow up, please get all Hospital records sent to your Prim MD by signing hospital release before you go home.   If you experience worsening of your admission symptoms, develop shortness of breath, life threatening emergency, suicidal or homicidal thoughts you must seek medical attention immediately by calling 911 or calling your MD immediately  if symptoms less severe.  You Must read complete instructions/literature along with all the possible adverse reactions/side effects for all the Medicines you take and that have been prescribed to you. Take any new Medicines after you have completely understood and accpet all the possible adverse reactions/side effects.   Do not drive, operating heavy machinery, perform activities at heights, swimming or participation in water activities or provide baby sitting services if your were admitted for syncope or siezures until you have seen by Primary MD or a Neurologist and advised to do so again.  Do not drive when taking Pain medications.    Do not take more than prescribed Pain, Sleep and Anxiety Medications  Special Instructions: If you have smoked or chewed  Tobacco  in the last 2 yrs please stop smoking, stop any regular Alcohol  and or any Recreational drug use.  Wear Seat belts while driving.   Please note  You were cared for by a hospitalist during your hospital stay. If you have any questions about your discharge medications or the care you received while you were in the hospital after you are discharged, you can call the unit and asked to speak with the hospitalist on call if the hospitalist that took care of you is not available. Once you are discharged, your primary care physician will handle any further medical issues. Please note that NO REFILLS for any discharge medications will be authorized once you are discharged, as it is imperative that you return to your primary care physician (or establish a relationship with a primary care physician if you do not have one) for your aftercare needs so that they can reassess your need for medications and monitor your lab values.

## 2014-05-12 NOTE — Progress Notes (Signed)
  Echocardiogram 2D Echocardiogram has been performed.  Diamond Nickel 05/12/2014, 8:49 AM

## 2014-05-14 ENCOUNTER — Telehealth: Payer: Self-pay | Admitting: Cardiology

## 2014-05-14 NOTE — Telephone Encounter (Signed)
Please call,concerning changing her blood pressure medicine,

## 2014-05-14 NOTE — Telephone Encounter (Signed)
Pt voiced understanding to no longer take HCTZ.

## 2014-05-14 NOTE — Telephone Encounter (Signed)
OK - agree with no HCTZ.  [name], MD

## 2014-05-14 NOTE — Telephone Encounter (Signed)
Pt recently seen in hospital.  Eval'd for CP and treated for hyponatremia (Na+ 122 -> 125 corrected day of d/c)  Was d/ced off hydrochlorothiazide. Pt needed clarification on medication changes, informed her hospital provider did want to stop her [place].  She was recommended to do a f/u in 1 week here, I sent request to scheduling to make her an appt.

## 2014-05-26 DIAGNOSIS — F419 Anxiety disorder, unspecified: Secondary | ICD-10-CM | POA: Diagnosis not present

## 2014-05-28 ENCOUNTER — Telehealth: Payer: Self-pay | Admitting: Cardiology

## 2014-05-28 NOTE — Telephone Encounter (Signed)
Close encounter 

## 2014-05-28 NOTE — Telephone Encounter (Signed)
Pt called in requesting another Blood Pressure log . She says that she has 3 sheets left. She says that it can be mailed to her house. Please call  Thanks

## 2014-05-28 NOTE — Telephone Encounter (Signed)
Obtained BP log copies from [facility]. Informed patient that copies will be mailed to her - verified mailing address.

## 2014-05-29 ENCOUNTER — Ambulatory Visit: Payer: Medicare Other | Admitting: Physician Assistant

## 2014-06-07 DIAGNOSIS — I10 Essential (primary) hypertension: Secondary | ICD-10-CM | POA: Diagnosis not present

## 2014-06-07 DIAGNOSIS — E782 Mixed hyperlipidemia: Secondary | ICD-10-CM | POA: Diagnosis not present

## 2014-06-07 DIAGNOSIS — R1030 Lower abdominal pain, unspecified: Secondary | ICD-10-CM | POA: Diagnosis not present

## 2014-07-20 ENCOUNTER — Telehealth: Payer: Self-pay | Admitting: Cardiology

## 2014-07-20 NOTE — Telephone Encounter (Signed)
Patient has been prescribed Losartan. Please see the directions for taking the medication. Was there a mistake in the dosing?  The patient was prescribed Losartan 50 mg and directed to take as following: "Take 2 tablets (100 mg total) by mouth daily. 2 tablets in the morning and 1 tablet in the evening"

## 2014-07-20 NOTE — Telephone Encounter (Signed)
Please call,concerning a prescription change.

## 2014-07-23 NOTE — Telephone Encounter (Signed)
Again, I have not seen her in 10 months.  That was the dose I have from the note last April.  Am probably ok with just taking 50 mg BID or 100mg  daily, but would need to get a sense of her BP.  [place]

## 2014-07-24 MED ORDER — LOSARTAN POTASSIUM 50 MG PO TABS: ORAL_TABLET | ORAL | Status: DC

## 2014-07-24 NOTE — Telephone Encounter (Signed)
Returned call to patient she stated she was calling because she needs refill for losartan 50 mg Nurse, mental health ( white tablet ) .Stated she received losartan 50 mg [employer] manufacture ( green tablet ) and had a reaction upset stomach.Stated unable to take green tablet.Stated she needs refill for losartan 50 mg white tablet Nurse, mental health.Stated pharmacist at [facility] told her to always request Nurse, mental health.Refill sent to pharmacy.

## 2014-07-24 NOTE — Telephone Encounter (Signed)
Returned call to patient no answer.LMTC. 

## 2014-07-24 NOTE — Telephone Encounter (Signed)
Pt is returning Cheryl's call

## 2014-07-28 ENCOUNTER — Other Ambulatory Visit: Payer: Self-pay | Admitting: Cardiology

## 2014-08-02 DIAGNOSIS — Z1389 Encounter for screening for other disorder: Secondary | ICD-10-CM | POA: Diagnosis not present

## 2014-08-02 DIAGNOSIS — N390 Urinary tract infection, site not specified: Secondary | ICD-10-CM | POA: Diagnosis not present

## 2014-08-02 DIAGNOSIS — E782 Mixed hyperlipidemia: Secondary | ICD-10-CM | POA: Diagnosis not present

## 2014-08-02 DIAGNOSIS — I10 Essential (primary) hypertension: Secondary | ICD-10-CM | POA: Diagnosis not present

## 2014-08-02 DIAGNOSIS — D649 Anemia, unspecified: Secondary | ICD-10-CM | POA: Diagnosis not present

## 2014-08-02 DIAGNOSIS — Z0000 Encounter for general adult medical examination without abnormal findings: Secondary | ICD-10-CM | POA: Diagnosis not present

## 2014-08-05 ENCOUNTER — Other Ambulatory Visit: Payer: Self-pay | Admitting: Cardiology

## 2014-08-06 NOTE — Telephone Encounter (Signed)
Rx has been sent to the pharmacy electronically. ° °

## 2014-08-16 DIAGNOSIS — R21 Rash and other nonspecific skin eruption: Secondary | ICD-10-CM | POA: Diagnosis not present

## 2014-08-16 DIAGNOSIS — D649 Anemia, unspecified: Secondary | ICD-10-CM | POA: Diagnosis not present

## 2014-08-16 DIAGNOSIS — E782 Mixed hyperlipidemia: Secondary | ICD-10-CM | POA: Diagnosis not present

## 2014-08-16 DIAGNOSIS — I10 Essential (primary) hypertension: Secondary | ICD-10-CM | POA: Diagnosis not present

## 2014-09-13 DIAGNOSIS — L821 Other seborrheic keratosis: Secondary | ICD-10-CM | POA: Diagnosis not present

## 2014-09-13 DIAGNOSIS — Z85828 Personal history of other malignant neoplasm of skin: Secondary | ICD-10-CM | POA: Diagnosis not present

## 2014-09-13 DIAGNOSIS — D1801 Hemangioma of skin and subcutaneous tissue: Secondary | ICD-10-CM | POA: Diagnosis not present

## 2014-09-20 ENCOUNTER — Ambulatory Visit: Payer: Medicare Other | Admitting: Cardiology

## 2014-10-02 ENCOUNTER — Telehealth: Payer: Self-pay | Admitting: Cardiology

## 2014-10-02 NOTE — Telephone Encounter (Signed)
Pt called in stating that she had to cancel her appt with Dr. [name] on 4/28 because he was running an hour and a half behind. She is concerned about getting her medications refilled. She would like to know if she should schedule another appt or will Dr. [name] be able to refill these without an appt. Please f/u with pt   Thanks

## 2014-10-02 NOTE — Telephone Encounter (Signed)
Spoke with patient and informed her she will need an appointment for refills - she had to cancel appointment 4/28. Patient scheduled for Wednesday July 13th @930am 

## 2014-10-03 ENCOUNTER — Other Ambulatory Visit: Payer: Self-pay | Admitting: Cardiology

## 2014-10-04 ENCOUNTER — Other Ambulatory Visit: Payer: Self-pay | Admitting: *Deleted

## 2014-10-04 MED ORDER — DILTIAZEM HCL ER 120 MG PO CP24: 120.0000 mg | ORAL_CAPSULE | Freq: Every day | ORAL | Status: DC

## 2014-10-10 ENCOUNTER — Telehealth: Payer: Self-pay | Admitting: Cardiology

## 2014-10-10 NOTE — Telephone Encounter (Signed)
Pt states she was prescribed HCTZ 25mg  daily in the past, was taken off of it by PCP for concern of hyponatremia.  States sodium has continued to run low irrespective of med changes. She would like to address this, but also concerned w/ BP - wants to resume HCTZ because she reports feeling much better on it.  She reports systolic BPs of 773-736K . Yesterday BP was 190/84. She resumed the HCTZ yesterday and today. Would like OK from Dr. [name] & if so, [facility]  She has appt to be seen for 7/13 w. Dr. [name].   Will route for advice.

## 2014-10-10 NOTE — Telephone Encounter (Signed)
I would not use HCTZ b/c of chronic HypoNa -- would prefer to increase Losrtan to 100 mg.  [place]

## 2014-10-10 NOTE — Telephone Encounter (Signed)
Left message for patient to call back  

## 2014-10-10 NOTE — Telephone Encounter (Signed)
Pt says her sodium is low,she think she needs to go back on her blood pressure medicine.

## 2014-10-11 NOTE — Telephone Encounter (Signed)
Clarification...she is already taking total of 150mg  losartan daily (2 pills in am and 1 pill in pm. Should we increase further or do different med?

## 2014-10-11 NOTE — Telephone Encounter (Signed)
OK -  I guess she was on that dosing for a long time  Dont want to use HCTZ b/ c hyponatremia. Lets increase Hydralazine to 50 mg bid (she is currently on 10 mg tab) - will need new Rx as she will quickly run out of 10 gm tabs.  [place]  She should be put on one of my 1/4 days in June - this is getting too hard to do via telephone.  [place]

## 2014-10-12 MED ORDER — HYDRALAZINE HCL 50 MG PO TABS
50.0000 mg | ORAL_TABLET | Freq: Two times a day (BID) | ORAL | Status: DC
Start: 2014-10-12 — End: 2014-11-05

## 2014-10-12 NOTE — Telephone Encounter (Signed)
She was initially scheduled for 7/13. Moved up to 6/23  - this works better for her - it is a Thursday and she has caregiver help on those days.  New dosing was explained to her. [facility]. She thinks she has enough of the 10mg  pills to last. I advised to call if she runs out, she verbalized understanding.

## 2014-10-12 NOTE — Telephone Encounter (Signed)
Great! DH 

## 2014-10-15 ENCOUNTER — Telehealth: Payer: Self-pay | Admitting: Cardiology

## 2014-10-15 NOTE — Telephone Encounter (Signed)
Noted physician agreement w/ nurse recommendations.

## 2014-10-15 NOTE — Telephone Encounter (Signed)
OK => go to 1/2 of Hydralazine 50mg  (i.e. 25 mg) bid  [facility]

## 2014-10-15 NOTE — Telephone Encounter (Signed)
Pt thinks she may have overshot medication. Regarding hydralazine, at Dr. Allison Quarry instruction she had gone up from 10mg  to 50mg  BID - I had advised her Friday to increase this dose slowly over a period of days with her remaining 10mg  strength pills to make sure she didn't have problems.  She got up to 4 pills BID over weekend and was having side effects from this - stomach upset, back ache. She reports that on a positive note, BP was down to 536 systolic.  Advised her to taper back to 2 pills for the time being, as she reports doing ok w/ no SE's at this strength. Advised if she picks up the 50mg  strength to halve these, take 1/2 tab BID for now.  Would prefer to set up patient for medication management with Erasmo Downer if an opening available, will defer to Dr. [name] for additional considerations.

## 2014-10-15 NOTE — Telephone Encounter (Signed)
[place] call,says her medicine is too strong.

## 2014-10-29 ENCOUNTER — Telehealth: Payer: Self-pay | Admitting: Cardiology

## 2014-10-29 NOTE — Telephone Encounter (Signed)
See note btw me & Erasmo Downer

## 2014-10-29 NOTE — Telephone Encounter (Signed)
Mild edema is a known side effect of hydralazine - venous pooling.  As long as no other Sx - would not be concerned.   Elevate feet when possible.  [place]

## 2014-10-29 NOTE — Telephone Encounter (Signed)
Pt's legs and ankles are swelling,please call pt to advise.

## 2014-10-29 NOTE — Telephone Encounter (Signed)
Called patient, as before, she had some anxiety surrounding her medications, other life issues. Needed reassurance and someone to talk to this afternoon, mainly.  States mild ankle swelling noted since increasing hydralazine - noted not limiting, she is not having any other issues (i.e. Dyspnea, other edema). i advised to remain.continue on current dose of hydralazine - she is currently taking 25mg  BID and feels this in combination w/ other meds is adequately controlling her BP at between 444-619 systolic.  She voiced acknowledgement of dose continuation, knows to call if any new/worsening symptoms - I will leave medication adjustments to be done at her visit w/ Dr. [name]. Routing this call as FYI.

## 2014-11-05 ENCOUNTER — Telehealth: Payer: Self-pay | Admitting: Cardiology

## 2014-11-05 MED ORDER — HYDRALAZINE HCL 10 MG PO TABS: 50.0000 mg | ORAL_TABLET | Freq: Every day | ORAL | Status: DC

## 2014-11-05 NOTE — Telephone Encounter (Signed)
Regarding hydralazine and LE swelling: Pt has been adjusting hydralazine dose at home in advance of her appt - she has figured out that 50mg  daily of the hydralazine has been adequate, split into 3 doses. She has been taking 2 10mg  tabs in AM, 1 at noon, 2 in pm.  She informed me swelling/fluid mgmt has been well controlled moreso than when on smaller dose, no side effects of feeling "washed out" as she felt w/ larger dose. Wanted refills for 10mg  tabs written for how she is taking currently. Advised pharmacy may want to send 25mg  tabs instead due to high count of pills, if so would need to adjust times she takes meds. Will submit for the 10mg  pills for now.

## 2014-11-05 NOTE — Telephone Encounter (Signed)
Pt called in stating that she would like to speak to the nurse about changing the dosage of Hydralazine. She stated that the 5 mg worked better for her. Please call  Thanks

## 2014-11-15 ENCOUNTER — Ambulatory Visit (INDEPENDENT_AMBULATORY_CARE_PROVIDER_SITE_OTHER): Payer: Medicare Other | Admitting: Cardiology

## 2014-11-15 ENCOUNTER — Encounter: Payer: Self-pay | Admitting: Cardiology

## 2014-11-15 VITALS — BP 146/84 | HR 75 | Ht <= 58 in | Wt 113.7 lb

## 2014-11-15 DIAGNOSIS — E785 Hyperlipidemia, unspecified: Secondary | ICD-10-CM | POA: Diagnosis not present

## 2014-11-15 DIAGNOSIS — R9431 Abnormal electrocardiogram [ECG] [EKG]: Secondary | ICD-10-CM

## 2014-11-15 DIAGNOSIS — M25473 Effusion, unspecified ankle: Secondary | ICD-10-CM

## 2014-11-15 DIAGNOSIS — R609 Edema, unspecified: Secondary | ICD-10-CM | POA: Diagnosis not present

## 2014-11-15 DIAGNOSIS — I10 Essential (primary) hypertension: Secondary | ICD-10-CM

## 2014-11-15 DIAGNOSIS — R600 Localized edema: Secondary | ICD-10-CM | POA: Insufficient documentation

## 2014-11-15 NOTE — Patient Instructions (Signed)
NO CHANGE WITH CURRENT MEDICATIONS    Your physician wants you to follow-up in April 2016  DR HARDING -30 MIN APPOINTMENT. You will receive a reminder letter in the mail two months in advance. If you don't receive a letter, please call our office to schedule the follow-up appointment.

## 2014-11-15 NOTE — Progress Notes (Signed)
PATIENT: Jasmin Lee MRN: [id number]  DOB: [birth year]   DOV:11/16/2014 PCP: [name], MD  Clinic Note: Chief Complaint  Patient presents with  . Annual Exam    Patient has had SOB and swelling ankles.   HPI: Jasmin Lee is a 81 y.o.  female with a PMH below who presents today for delayed one year followup. She usually follows up in April, but her April appointment was rescheduled. She would prefer to come in April because the weather is better. She is mostly followed for her labile blood pressure with some mild hypertensive heart disease.  Interval History: Today she presents without any significant symptoms of rapid irregular heartbeats or palpitations. No significant chest tightness or pressure with rest or exertion.she only really short of breath when her activity level picks up quite a bit. She says usually she does well during the course of days she does recommend rest. Allow surgery due in May. She also says that since she switched to taking her hydralazine 20 mg in the morning and evening with 10 mg during midday, she has been doing much better. Much less headaches or blurred vision or dizziness. Also no significant low blood pressures. By her home according to her blood pressure has remained stable.  No PND, orthopnea with only minimal edema. No syncope/near syncope or TIA/amaurosis fugax.  Past Medical History  Diagnosis Date  . COPD with asthma   . HTN (hypertension)   . Dyslipidemia   . Chronic bronchitis "2-3 times/yr"  . Echocardiogram abnormal 12/05/2010    Moderate concentric left ventricular hypertrophy EF > 18% stage I diastolic dysfunction, left atrium mild to moderate dilatation, moderate mitral annular calcification with trace MR. Trace TR.  . Cardiomyopathy   . Uterine cancer   . Squamous carcinoma     "left face and thigh"  . Complication of anesthesia     "started flinching at start of hysterectomy; they had to give me more anesthetic"  .  Pneumonia "several times"  . Anemia   . Arthritis     "in my legs and feet" (05/11/2014)    Prior Cardiac Evaluation and Past Surgical History: Past Surgical History  Procedure Laterality Date  . Vaginal hysterectomy  1966  . Appendectomy  1954  . Cataract extraction w/ intraocular lens  implant, bilateral Bilateral   . Cholecystectomy  2003  . Squamous cell carcinoma excision Left     face and thigh  . Transthoracic echocardiogram  December 05, 2010    moderate cocentric LVH WITH STAGE 1 IMPARIED DIASTOLIC DYSFUNCTION , NORMAL EF AND NORMAL LV PRESSURES. LEFT ATRIUM WAS ONLY MILD TO MODERATELY DILATED .MOD MITRAL CALCIFICATION WITH ONLY MILD REGURGITATION.,OTHERWISE RELATIVELY NORMAL  . Cardiac event monitor  12/05/10-01/02/11    SINUS RHYTHM  . Dilation and curettage of uterus  1966  . Breast biopsy Left     "it was nothing"    Allergies  Allergen Reactions  . Asa [Aspirin] Shortness Of Breath    On high doses of ASA  . Lisinopril Cough  . Penicillins     wheezing  . Keflex [Cephalexin] Rash    Current Outpatient Prescriptions  Medication Sig Dispense Refill  . acetaminophen (TYLENOL) 500 MG tablet Take 1,000 mg by mouth every 6 (six) hours as needed.    Marland Kitchen ADVAIR DISKUS 250-50 MCG/DOSE AEPB Inhale 1 puff into the lungs 2 (two) times daily.     Marland Kitchen albuterol (ACCUNEB) 0.63 MG/3ML nebulizer solution Take 1 ampule by nebulization every 6 (  six) hours as needed for wheezing.    . Artificial Tear Ointment (DRY EYES OP) Apply 1 drop to eye 4 (four) times daily as needed (dry eyes).    Marland Kitchen atorvastatin (LIPITOR) 10 MG tablet Take 10 mg by mouth every other day.     . Azelastine HCl 0.15 % SOLN Place 2 sprays into the nose daily.    Marland Kitchen CALCIUM PO Take 1 tablet by mouth daily.    . Cholecalciferol (VITAMIN D PO) Take 1 tablet by mouth daily.    . Cyanocobalamin (VITAMIN B-12 PO) Take 1 tablet by mouth daily.    Marland Kitchen diltiazem (DILT-XR) 120 MG 24 hr capsule Take 1 capsule (120 mg total) by  mouth daily. 90 capsule 3  . hydrALAZINE (APRESOLINE) 10 MG tablet Take 5 tablets (50 mg total) by mouth daily. 450 tablet 1  . losartan (COZAAR) 50 MG tablet Take 2 tablets in am and 1 tablet in pm 270 tablet 3  . montelukast (SINGULAIR) 10 MG tablet Take 10 mg by mouth at bedtime.     . Multiple Vitamin (MULTIVITAMIN WITH MINERALS) TABS tablet Take 1 tablet by mouth daily.    . pantoprazole (PROTONIX) 40 MG tablet Take 1 tablet (40 mg total) by mouth daily. 30 tablet 1  . VENTOLIN HFA 108 (90 BASE) MCG/ACT inhaler Inhale 2 puffs into the lungs every 4 (four) hours as needed for wheezing or shortness of breath.     . vitamin C (ASCORBIC ACID) 250 MG tablet Take 250 mg by mouth daily.     No current facility-administered medications for this visit.    History   Social History Narrative    ROS: A comprehensive Review of Systems - was performed Review of Systems  Respiratory: Positive for cough (Intermittent related to asthma), shortness of breath (Associated with asthma flare) and wheezing.   Gastrointestinal: Negative for constipation, blood in stool and melena.  Genitourinary: Negative for hematuria.  Musculoskeletal: Positive for joint pain (Arthritis in her hands.).  Neurological: Negative for dizziness.  All other systems reviewed and are negative.   PHYSICAL EXAM BP 146/84 mmHg  Pulse 75  Ht 4\' 10"  (1.473 m)  Wt 51.574 kg (113 lb 11.2 oz)  BMI 23.77 kg/m2 General appearance: alert, cooperative, appears stated age, no distress and Pleasant mood and affect. Quick with a joke Neck: no adenopathy, no carotid bruit, no JVD and supple, symmetrical, trachea midline Lungs: clear to auscultation bilaterally, normal percussion bilaterally and Nonlabored, good air movement with only mild interstitial sounds with deep inspiration. Somewhat prolonged expiratory phase. Heart: regular rate and rhythm, S1, S2 normal, no murmur, click, rub or gallop and normal apical impulse Abdomen: soft,  non-tender; bowel sounds normal; no masses,  no organomegaly Extremities: No clubbing/cyanosis with trace edema Pulses: 2+ and symmetric Neurologic: Alert and oriented X 3, normal strength and tone. Normal symmetric reflexes. Normal coordination and gait   Adult ECG Report  Rate: 72 ;  Rhythm: normal sinus rhythm and First degree AV block (EKG machine read as Afib, but P waves are seen)  Narrative Interpretation: Otherwise normal EKG besides PACs  Recent Labs: None since last hospitalization  ASSESSMENT / PLAN: Essential hypertension Actually this is probably best blood pressure recorded in nursing for a while. The combination of medications she is currently on seems to be effective. She is not on beta blockers or ACE inhibitors due to her pulmonary issues. She is on ARB as well as calcium channel blocker. She taking a total  of 150 of Cozaar which is relatively high. She does have the potential to increase all times and if blood pressure increases, however she has had issues in the past with bradycardia.  Continue current medications.  Abnormal EKG She does have some PACs on her EKG and the computer read it as possibly A. Fib. The QRS complexes march out, and a few P waves are visible, however difficult to see. It doesn't have the overall appearance of atrial fibrillation. We will need to continue to monitor her EKGs to ensure that she is not developing atrial fibrillation. For that reason, I'm glad that she is on the diltiazem. Given her age, I would be reluctant to consider any full anticoagulation, and she had had issues with aspirin in the past. She does not have any symptoms of A. Fib and I don't think that this actually is. Therefore we will simply treat as PACs with a CC B..  Dyslipidemia On statin. Monitor for PCP.  Ankle edema Elevate feet. I think probably some is related to her vasodilators. I would not want to start a diuretic just for mild edema. Consider support  stockings.    Orders Placed This Encounter  Procedures  . EKG 12-Lead   No orders of the defined types were placed in this encounter.    Followup: One year - April 20 17th  Evangelyne Loja W. [name], M.D., M.S. Interventional Cardiology [facility]

## 2014-11-16 ENCOUNTER — Encounter: Payer: Self-pay | Admitting: Cardiology

## 2014-11-16 NOTE — Assessment & Plan Note (Signed)
Elevate feet. I think probably some is related to her vasodilators. I would not want to start a diuretic just for mild edema. Consider support stockings.

## 2014-11-16 NOTE — Assessment & Plan Note (Signed)
She does have some PACs on her EKG and the computer read it as possibly A. Fib. The QRS complexes march out, and a few P waves are visible, however difficult to see. It doesn't have the overall appearance of atrial fibrillation. We will need to continue to monitor her EKGs to ensure that she is not developing atrial fibrillation. For that reason, I'm glad that she is on the diltiazem. Given her age, I would be reluctant to consider any full anticoagulation, and she had had issues with aspirin in the past. She does not have any symptoms of A. Fib and I don't think that this actually is. Therefore we will simply treat as PACs with a CC B.Marland Kitchen

## 2014-11-16 NOTE — Assessment & Plan Note (Signed)
On statin. Monitor for PCP.

## 2014-11-16 NOTE — Assessment & Plan Note (Signed)
Actually this is probably best blood pressure recorded in nursing for a while. The combination of medications she is currently on seems to be effective. She is not on beta blockers or ACE inhibitors due to her pulmonary issues. She is on ARB as well as calcium channel blocker. She taking a total of 150 of Cozaar which is relatively high. She does have the potential to increase all times and if blood pressure increases, however she has had issues in the past with bradycardia.  Continue current medications.

## 2014-11-22 DIAGNOSIS — H6980 Other specified disorders of Eustachian tube, unspecified ear: Secondary | ICD-10-CM | POA: Diagnosis not present

## 2014-12-05 ENCOUNTER — Ambulatory Visit: Payer: Self-pay | Admitting: Cardiology

## 2015-01-03 DIAGNOSIS — I10 Essential (primary) hypertension: Secondary | ICD-10-CM | POA: Diagnosis not present

## 2015-01-03 DIAGNOSIS — E782 Mixed hyperlipidemia: Secondary | ICD-10-CM | POA: Diagnosis not present

## 2015-01-03 DIAGNOSIS — D649 Anemia, unspecified: Secondary | ICD-10-CM | POA: Diagnosis not present

## 2015-01-10 DIAGNOSIS — E871 Hypo-osmolality and hyponatremia: Secondary | ICD-10-CM | POA: Diagnosis not present

## 2015-01-10 DIAGNOSIS — E782 Mixed hyperlipidemia: Secondary | ICD-10-CM | POA: Diagnosis not present

## 2015-01-10 DIAGNOSIS — I10 Essential (primary) hypertension: Secondary | ICD-10-CM | POA: Diagnosis not present

## 2015-01-10 DIAGNOSIS — J4520 Mild intermittent asthma, uncomplicated: Secondary | ICD-10-CM | POA: Diagnosis not present

## 2015-01-10 DIAGNOSIS — D500 Iron deficiency anemia secondary to blood loss (chronic): Secondary | ICD-10-CM | POA: Diagnosis not present

## 2015-01-10 DIAGNOSIS — R600 Localized edema: Secondary | ICD-10-CM | POA: Diagnosis not present

## 2015-01-10 DIAGNOSIS — E781 Pure hyperglyceridemia: Secondary | ICD-10-CM | POA: Diagnosis not present

## 2015-01-31 DIAGNOSIS — H3531 Nonexudative age-related macular degeneration: Secondary | ICD-10-CM | POA: Diagnosis not present

## 2015-02-14 DIAGNOSIS — R197 Diarrhea, unspecified: Secondary | ICD-10-CM | POA: Diagnosis not present

## 2015-02-14 DIAGNOSIS — E871 Hypo-osmolality and hyponatremia: Secondary | ICD-10-CM | POA: Diagnosis not present

## 2015-02-14 DIAGNOSIS — R600 Localized edema: Secondary | ICD-10-CM | POA: Diagnosis not present

## 2015-02-14 DIAGNOSIS — D649 Anemia, unspecified: Secondary | ICD-10-CM | POA: Diagnosis not present

## 2015-02-20 ENCOUNTER — Ambulatory Visit: Payer: Self-pay | Admitting: Podiatry

## 2015-03-07 DIAGNOSIS — Z23 Encounter for immunization: Secondary | ICD-10-CM | POA: Diagnosis not present

## 2015-03-14 ENCOUNTER — Encounter: Payer: Self-pay | Admitting: Podiatry

## 2015-03-14 ENCOUNTER — Ambulatory Visit (INDEPENDENT_AMBULATORY_CARE_PROVIDER_SITE_OTHER): Payer: Medicare Other | Admitting: Podiatry

## 2015-03-14 VITALS — BP 170/91 | HR 74 | Resp 12

## 2015-03-14 DIAGNOSIS — M25473 Effusion, unspecified ankle: Secondary | ICD-10-CM

## 2015-03-14 DIAGNOSIS — B351 Tinea unguium: Secondary | ICD-10-CM

## 2015-03-14 DIAGNOSIS — R609 Edema, unspecified: Secondary | ICD-10-CM

## 2015-03-14 DIAGNOSIS — M79675 Pain in left toe(s): Secondary | ICD-10-CM | POA: Diagnosis not present

## 2015-03-14 DIAGNOSIS — M79674 Pain in right toe(s): Secondary | ICD-10-CM | POA: Diagnosis not present

## 2015-03-14 DIAGNOSIS — M79676 Pain in unspecified toe(s): Secondary | ICD-10-CM | POA: Diagnosis not present

## 2015-03-14 NOTE — Progress Notes (Signed)
   Subjective:    Patient ID: Jasmin Lee, female    DOB: [birth year], 81 y.o.   MRN: [id number]  HPI This patient presents to the office for care of her nails.  She has not trimmed her nails for months and they have grown long and thick especially her big toenails both feet.Her nails are painful walking and wearing her shoes. The patient presents here today for B/L toenail trim and nails that are discolored.      Review of Systems  All other systems reviewed and are negative.      Objective:   Physical Exam GENERAL APPEARANCE: Alert, conversant. Appropriately groomed. No acute distress.  VASCULAR: Pedal pulses palpable at  [facility] and PT bilateral.  Capillary refill time is immediate to all digits,  Normal temperature gradient.  Digital hair growth is present bilateral  NEUROLOGIC: sensation is normal to 5.07 monofilament at 5/5 sites bilateral.  Light touch is intact bilateral, Muscle strength normal.  MUSCULOSKELETAL: acceptable muscle strength, tone and stability bilateral.  Intrinsic muscluature intact bilateral.  Rectus appearance of foot and digits noted bilateral. Healing midfoot left foot. From previous liz-Frank injury.  DERMATOLOGIC: skin color, texture, and turgor are within normal limits.  No preulcerative lesions or ulcers  are seen, no interdigital maceration noted.  No open lesions present. . No drainage noted.  NAILS  Thick disfigured discolored nails both feet.         Assessment & Plan:  Onychomycosis  B/L  Debridement and grinding of long thick painful nails both feet.

## 2015-03-28 DIAGNOSIS — R600 Localized edema: Secondary | ICD-10-CM | POA: Diagnosis not present

## 2015-03-28 DIAGNOSIS — I10 Essential (primary) hypertension: Secondary | ICD-10-CM | POA: Diagnosis not present

## 2015-03-28 DIAGNOSIS — R197 Diarrhea, unspecified: Secondary | ICD-10-CM | POA: Diagnosis not present

## 2015-03-28 DIAGNOSIS — E871 Hypo-osmolality and hyponatremia: Secondary | ICD-10-CM | POA: Diagnosis not present

## 2015-03-28 DIAGNOSIS — E782 Mixed hyperlipidemia: Secondary | ICD-10-CM | POA: Diagnosis not present

## 2015-04-04 DIAGNOSIS — Z01419 Encounter for gynecological examination (general) (routine) without abnormal findings: Secondary | ICD-10-CM | POA: Diagnosis not present

## 2015-04-04 DIAGNOSIS — Z6823 Body mass index (BMI) 23.0-23.9, adult: Secondary | ICD-10-CM | POA: Diagnosis not present

## 2015-04-22 ENCOUNTER — Other Ambulatory Visit: Payer: Self-pay | Admitting: Cardiology

## 2015-04-25 ENCOUNTER — Other Ambulatory Visit: Payer: Self-pay | Admitting: *Deleted

## 2015-04-25 MED ORDER — HYDRALAZINE HCL 10 MG PO TABS: 50.0000 mg | ORAL_TABLET | Freq: Every day | ORAL | Status: DC

## 2015-04-26 ENCOUNTER — Other Ambulatory Visit: Payer: Self-pay | Admitting: Cardiology

## 2015-04-26 MED ORDER — HYDRALAZINE HCL 10 MG PO TABS: 50.0000 mg | ORAL_TABLET | Freq: Every day | ORAL | Status: DC

## 2015-05-01 ENCOUNTER — Telehealth: Payer: Self-pay | Admitting: Cardiology

## 2015-05-01 NOTE — Telephone Encounter (Signed)
Spoke to pt to inform her that I had sent in her prescription on 12/2 and that it would take 7-10 business days to arrive. She stated that [facility] told her they were processing it now and it would get to her soon.

## 2015-05-01 NOTE — Telephone Encounter (Signed)
°*  STAT* If patient is at the pharmacy, call can be transferred to refill team.   1. Which medications need to be refilled? (please list name of each medication and dose if known) Hydralazine 10mg    2. Which pharmacy/location (including street and city if local pharmacy) is medication to be sent to? [facility]   3. Do they need a 30 day or 90 day supply? [place]

## 2015-05-07 DIAGNOSIS — J4530 Mild persistent asthma, uncomplicated: Secondary | ICD-10-CM | POA: Diagnosis not present

## 2015-05-07 DIAGNOSIS — J309 Allergic rhinitis, unspecified: Secondary | ICD-10-CM | POA: Diagnosis not present

## 2015-06-13 ENCOUNTER — Encounter: Payer: Self-pay | Admitting: Podiatry

## 2015-06-13 ENCOUNTER — Ambulatory Visit (INDEPENDENT_AMBULATORY_CARE_PROVIDER_SITE_OTHER): Payer: Medicare Other | Admitting: Podiatry

## 2015-06-13 DIAGNOSIS — M79674 Pain in right toe(s): Secondary | ICD-10-CM

## 2015-06-13 DIAGNOSIS — M79676 Pain in unspecified toe(s): Secondary | ICD-10-CM | POA: Diagnosis not present

## 2015-06-13 DIAGNOSIS — L84 Corns and callosities: Secondary | ICD-10-CM

## 2015-06-13 DIAGNOSIS — B351 Tinea unguium: Secondary | ICD-10-CM | POA: Diagnosis not present

## 2015-06-13 NOTE — Progress Notes (Signed)
   Subjective:    Patient ID: Jasmin Lee, female    DOB: 06-18-1922, 82 y.o.   MRN: [id number]  HPI This patient presents to the office for care of her nails.  She has not trimmed her nails for months and they have grown long and thick especially her big toenails both feet.Her nails are painful walking and wearing her shoes. The patient presents here today for B/L toenail trim and nails that are discolored.      Review of Systems  All other systems reviewed and are negative.      Objective:   Physical Exam GENERAL APPEARANCE: Alert, conversant. Appropriately groomed. No acute distress.  VASCULAR: Pedal pulses palpable at  [facility] and PT bilateral.  Capillary refill time is immediate to all digits,  Normal temperature gradient.  Digital hair growth is present bilateral  NEUROLOGIC: sensation is normal to 5.07 monofilament at 5/5 sites bilateral.  Light touch is intact bilateral, Muscle strength normal.  MUSCULOSKELETAL: acceptable muscle strength, tone and stability bilateral.  Intrinsic muscluature intact bilateral.  Rectus appearance of foot and digits noted bilateral. Healing midfoot left foot. From previous liz-Frank injury.  DERMATOLOGIC: skin color, texture, and turgor are within normal limits.  No preulcerative lesions or ulcers  are seen, no interdigital maceration noted.  No open lesions present. . No drainage noted.  NAILS  Thick disfigured discolored nails both feet.         Assessment & Plan:  Onychomycosis  B/L  Debridement and grinding of long thick painful nails both feet.Gardiner Barefoot DPM

## 2015-06-20 DIAGNOSIS — J4520 Mild intermittent asthma, uncomplicated: Secondary | ICD-10-CM | POA: Diagnosis not present

## 2015-06-20 DIAGNOSIS — E871 Hypo-osmolality and hyponatremia: Secondary | ICD-10-CM | POA: Diagnosis not present

## 2015-06-20 DIAGNOSIS — E782 Mixed hyperlipidemia: Secondary | ICD-10-CM | POA: Diagnosis not present

## 2015-06-20 DIAGNOSIS — I10 Essential (primary) hypertension: Secondary | ICD-10-CM | POA: Diagnosis not present

## 2015-08-01 ENCOUNTER — Other Ambulatory Visit: Payer: Self-pay | Admitting: Cardiology

## 2015-08-01 NOTE — Telephone Encounter (Signed)
Rx(s) sent to pharmacy electronically.  

## 2015-08-22 DIAGNOSIS — Z85828 Personal history of other malignant neoplasm of skin: Secondary | ICD-10-CM | POA: Diagnosis not present

## 2015-08-22 DIAGNOSIS — L821 Other seborrheic keratosis: Secondary | ICD-10-CM | POA: Diagnosis not present

## 2015-08-22 DIAGNOSIS — D1801 Hemangioma of skin and subcutaneous tissue: Secondary | ICD-10-CM | POA: Diagnosis not present

## 2015-08-22 DIAGNOSIS — D485 Neoplasm of uncertain behavior of skin: Secondary | ICD-10-CM | POA: Diagnosis not present

## 2015-09-11 ENCOUNTER — Other Ambulatory Visit: Payer: Self-pay | Admitting: Cardiology

## 2015-09-11 NOTE — Telephone Encounter (Signed)
Rx request sent to pharmacy.  

## 2015-09-12 ENCOUNTER — Ambulatory Visit: Payer: Medicare Other | Admitting: Podiatry

## 2015-09-18 DIAGNOSIS — N390 Urinary tract infection, site not specified: Secondary | ICD-10-CM | POA: Diagnosis not present

## 2015-09-18 DIAGNOSIS — R109 Unspecified abdominal pain: Secondary | ICD-10-CM | POA: Diagnosis not present

## 2015-09-26 ENCOUNTER — Ambulatory Visit (INDEPENDENT_AMBULATORY_CARE_PROVIDER_SITE_OTHER): Payer: Medicare Other | Admitting: Cardiology

## 2015-09-26 ENCOUNTER — Encounter: Payer: Self-pay | Admitting: Cardiology

## 2015-09-26 VITALS — BP 160/80 | HR 72 | Ht <= 58 in | Wt 114.4 lb

## 2015-09-26 DIAGNOSIS — E785 Hyperlipidemia, unspecified: Secondary | ICD-10-CM

## 2015-09-26 DIAGNOSIS — I10 Essential (primary) hypertension: Secondary | ICD-10-CM

## 2015-09-26 NOTE — Patient Instructions (Signed)
NO CHANGES WITH CURRENT MEDICATIONS   Your physician wants you to follow-up in 12 MONTHS WITH DR HARDING. You will receive a reminder letter in the mail two months in advance. If you don't receive a letter, please call our office to schedule the follow-up appointment.   If you need a refill on your cardiac medications before your next appointment, please call your pharmacy.  

## 2015-09-26 NOTE — Progress Notes (Signed)
PCP: [name], MD  Clinic Note: Chief Complaint  Patient presents with  . Follow-up    11 months  . Hypertension  . Palpitations    HPI: Jasmin Lee is a 82 y.o. female with a PMH below who presents today for annual follow-up of labile blood pressure and mild hypertensive heart disease with palpitations.Jasmin Lee was last seen on 11/15/2014. She noted palpitations and irregular heartbeats. Exertional dyspnea with increased level activity. She switched taking hydralazine 20 mg to the morning and evening with 10 mg in the mid day and was feeling much better from a blood pressure standpoint. Otherwise doing well.  Recent Hospitalizations: Not noted in Epic  Studies Reviewed: None  Interval History:  Overall doing well.He is not had any really significant cardiac complaints. 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  Uses these outings as a break from the stress. She does have a caregiver that comes out once a week on Thursdays. Very anxious trying to come in - care giver driving & Tera gets confused & feels lost while trying to get here.  No chest pain or shortness of breath with rest or exertion. No PND, orthopnea or edema. No palpitations, lightheadedness, dizziness, weakness or syncope/near syncope. No TIA/amaurosis fugax symptoms.   ROS: A comprehensive was performed. Review of Systems  Constitutional: Negative for malaise/fatigue.  HENT: Negative for nosebleeds.   Cardiovascular: Negative for palpitations, claudication and leg swelling.  Gastrointestinal: Negative for heartburn, blood in stool and melena.  Genitourinary: Negative for hematuria.  Musculoskeletal: Positive for joint pain (Right knee cartilage tear. May need to have surgery.).  Neurological: Positive for dizziness (Sometimes if she gets up too fast). Negative for headaches.  Endo/Heme/Allergies: Does  not bruise/bleed easily.  Psychiatric/Behavioral: Negative for depression and memory loss (She doesn't seem to think so, but by taking her history is seems like she may).       Under a lot of social stress as caregiver for her husband  All other systems reviewed and are negative.   Past Medical History  Diagnosis Date  . COPD with asthma ([place])   . HTN (hypertension)   . Dyslipidemia   . Chronic bronchitis ([place]) "2-3 times/yr"  . Echocardiogram abnormal 12/05/2010    Moderate concentric left ventricular hypertrophy EF > [id number] stage I diastolic dysfunction, left atrium mild to moderate dilatation, moderate mitral annular calcification with trace MR. Trace TR.  . Cardiomyopathy ([place])   . Uterine cancer ([place])   . Squamous carcinoma ([place])     "left face and thigh"  . Complication of anesthesia     "started flinching at start of hysterectomy; they had to give me more anesthetic"  . Pneumonia "several times"  . Anemia   . Arthritis     "in my legs and feet" (05/11/2014)    Past Surgical History  Procedure Laterality Date  . Vaginal hysterectomy  1966  . Appendectomy  1954  . Cataract extraction w/ intraocular lens  implant, bilateral Bilateral   . Cholecystectomy  2003  . Squamous cell carcinoma excision Left     face and thigh  . Transthoracic echocardiogram  December 05, 2010    moderate cocentric LVH WITH STAGE 1 IMPARIED DIASTOLIC DYSFUNCTION , NORMAL EF AND NORMAL LV PRESSURES. LEFT ATRIUM WAS ONLY MILD TO MODERATELY DILATED .MOD MITRAL CALCIFICATION WITH ONLY MILD REGURGITATION.,OTHERWISE RELATIVELY NORMAL  . Cardiac event  monitor  12/05/10-01/02/11    SINUS RHYTHM  . Dilation and curettage of uterus  1966  . Breast biopsy Left     "it was nothing"    Prior to Admission medications   Medication Sig Start Date End Date Taking? Authorizing Provider  acetaminophen (TYLENOL) 500 MG tablet Take 1,000 mg by mouth every 6 (six) hours as needed.   Yes Historical Provider, MD  ADVAIR  DISKUS 250-50 MCG/DOSE AEPB Inhale 1 puff into the lungs 2 (two) times daily.  01/26/13  Yes Historical Provider, MD  albuterol (ACCUNEB) 0.63 MG/3ML nebulizer solution Take 1 ampule by nebulization every 6 (six) hours as needed for wheezing.   Yes Historical Provider, MD  Artificial Tear Ointment (DRY EYES OP) Apply 1 drop to eye 4 (four) times daily as needed (dry eyes).   Yes Historical Provider, MD  atorvastatin (LIPITOR) 10 MG tablet Take 10 mg by mouth every other day.  01/30/13  Yes Historical Provider, MD  Azelastine HCl 0.15 % SOLN Place 2 sprays into the nose daily. 12/03/12  Yes Historical Provider, MD  CALCIUM PO Take 1 tablet by mouth daily.   Yes Historical Provider, MD  Cholecalciferol (VITAMIN D PO) Take 1 tablet by mouth daily.   Yes Historical Provider, MD  Cyanocobalamin (VITAMIN B-12 PO) Take 1 tablet by mouth daily.   Yes Historical Provider, MD  diltiazem (DILT-XR) 120 MG 24 hr capsule Take 1 capsule (120 mg total) by mouth daily. 10/04/14  Yes [name], MD  hydrALAZINE (APRESOLINE) 10 MG tablet TAKE 5 TABLETS DAILY (2 TABLETS IN THE MORNING, 1 TABLET IN THE AFTERNOON AND 2 TABLETS IN THE EVENING) 08/01/15  Yes [name], MD  losartan (COZAAR) 50 MG tablet TAKE 2 TABLETS IN THE MORNING AND 1 TABLET IN THE EVENING 09/11/15  Yes [name], MD  montelukast (SINGULAIR) 10 MG tablet Take 10 mg by mouth at bedtime.  12/03/12  Yes Historical Provider, MD  Multiple Vitamin (MULTIVITAMIN WITH MINERALS) TABS tablet Take 1 tablet by mouth daily.   Yes Historical Provider, MD  pantoprazole (PROTONIX) 40 MG tablet Take 1 tablet (40 mg total) by mouth daily. 06/13/13  Yes [name], MD  VENTOLIN HFA 108 (90 BASE) MCG/ACT inhaler Inhale 2 puffs into the lungs every 4 (four) hours as needed for wheezing or shortness of breath.  01/26/13  Yes Historical Provider, MD  vitamin C (ASCORBIC ACID) 250 MG tablet Take 250 mg by mouth daily.   Yes Historical Provider, MD    Allergies    Allergen Reactions  . Asa [Aspirin] Shortness Of Breath    On high doses of ASA  . Lisinopril Cough  . Penicillins     wheezing  . Keflex [Cephalexin] Rash    Social History   Social History  . Marital Status: Married    Spouse Name: N/A  . Number of Children: N/A  . Years of Education: N/A   Social History Main Topics  . Smoking status: Never Smoker   . Smokeless tobacco: Never Used  . Alcohol Use: No  . Drug Use: No  . Sexual Activity: No   Other Topics Concern  . None   Social History Narrative   family history includes Asthma in her daughter; Cancer in her father; Heart attack in her maternal grandmother and mother; Heart disease in her son.   Wt Readings from Last 3 Encounters:  09/26/15 114 lb 6.4 oz (51.891 kg)  11/15/14 113 lb 11.2 oz (51.574  kg)  05/12/14 111 lb 12.8 oz (50.712 kg)    PHYSICAL EXAM BP 160/80 mmHg  Pulse 72  Ht 4\' 10"  (1.473 m)  Wt 114 lb 6.4 oz (51.891 kg)  BMI 23.92 kg/m2 General appearance: alert, cooperative, appears stated age, no distress and Pleasant mood and affect. Quick with a joke Neck: no adenopathy, no carotid bruit, no JVD and supple, symmetrical, trachea midline Lungs: clear to auscultation bilaterally, normal percussion bilaterally and Nonlabored, good air movement with only mild interstitial sounds with deep inspiration. Somewhat prolonged expiratory phase. Heart: regular rate and rhythm, S1, S2 normal, Soft  1/6 HSM @ LLSB & apex .  No click, rub or gallop and normal apical impulse Abdomen: soft, non-tender; bowel sounds normal; no masses, no organomegaly Extremities: No clubbing/cyanosis with trace edema Pulses: 2+ and symmetric Neurologic: Alert and oriented X 3, normal strength and tone. Normal symmetric reflexes. Normal coordination and gait    Adult ECG Report As with previousl EKGs - P waves are very difficult to distinguish, but the rhythm is regular with regular intervals.  Does not look like Afib. Rate: 72 ;   Rhythm: normal sinus rhythm;   Narrative Interpretation: stable EKG   Other studies Reviewed: Additional studies/ records that were reviewed today include:  Recent Labs:  Due to have labs checked by PCP soon   ASSESSMENT / PLAN: Problem List Items Addressed This Visit    Essential hypertension - Primary (Chronic)    Her blood pressures a bit high today, because she is very stressed out, and did not take her medicine this morning. It is been very difficult to get her on a stable regimen, at this point I'm not to make any changes.  She will continue with her hydralazine at 3 times a day dosing: 20 mg, 10 mg, 20 mg She is on diltiazem and losartan.      Relevant Orders   EKG 12-Lead (Completed)   Dyslipidemia (Chronic)    She is currently on a low-dose statin and monitored by PCP.      Relevant Orders   EKG 12-Lead (Completed)      Current medicines are reviewed at length with the patient today. (+/- concerns) None. Stable with the current regimen of hydralazine The following changes have been made: None Studies Ordered:   Orders Placed This Encounter  Procedures  . EKG 12-Lead   Follow-up in one year   HARDING, Leonie Green, M.D., M.S. Interventional Cardiologist   Pager # [phone] Phone # [phone] [address] [place], [place] [address]

## 2015-09-28 ENCOUNTER — Encounter: Payer: Self-pay | Admitting: Cardiology

## 2015-09-28 NOTE — Assessment & Plan Note (Signed)
She is currently on a low-dose statin and monitored by PCP.

## 2015-09-28 NOTE — Assessment & Plan Note (Signed)
Her blood pressures a bit high today, because she is very stressed out, and did not take her medicine this morning. It is been very difficult to get her on a stable regimen, at this point I'm not to make any changes.  She will continue with her hydralazine at 3 times a day dosing: 20 mg, 10 mg, 20 mg She is on diltiazem and losartan.

## 2015-10-03 DIAGNOSIS — N390 Urinary tract infection, site not specified: Secondary | ICD-10-CM | POA: Diagnosis not present

## 2015-10-03 DIAGNOSIS — I10 Essential (primary) hypertension: Secondary | ICD-10-CM | POA: Diagnosis not present

## 2015-10-03 DIAGNOSIS — R350 Frequency of micturition: Secondary | ICD-10-CM | POA: Diagnosis not present

## 2015-10-03 DIAGNOSIS — E782 Mixed hyperlipidemia: Secondary | ICD-10-CM | POA: Diagnosis not present

## 2015-10-10 ENCOUNTER — Other Ambulatory Visit: Payer: Self-pay | Admitting: Internal Medicine

## 2015-10-10 DIAGNOSIS — R600 Localized edema: Secondary | ICD-10-CM | POA: Diagnosis not present

## 2015-10-10 DIAGNOSIS — D649 Anemia, unspecified: Secondary | ICD-10-CM | POA: Diagnosis not present

## 2015-10-10 DIAGNOSIS — R0989 Other specified symptoms and signs involving the circulatory and respiratory systems: Secondary | ICD-10-CM | POA: Diagnosis not present

## 2015-10-10 DIAGNOSIS — E782 Mixed hyperlipidemia: Secondary | ICD-10-CM | POA: Diagnosis not present

## 2015-10-17 ENCOUNTER — Ambulatory Visit (INDEPENDENT_AMBULATORY_CARE_PROVIDER_SITE_OTHER): Payer: Medicare Other | Admitting: Podiatry

## 2015-10-17 ENCOUNTER — Encounter: Payer: Self-pay | Admitting: Podiatry

## 2015-10-17 DIAGNOSIS — M79676 Pain in unspecified toe(s): Secondary | ICD-10-CM

## 2015-10-17 DIAGNOSIS — B351 Tinea unguium: Secondary | ICD-10-CM

## 2015-10-17 DIAGNOSIS — M79674 Pain in right toe(s): Secondary | ICD-10-CM

## 2015-10-17 NOTE — Progress Notes (Signed)
   Subjective:    Patient ID: Jasmin Lee, female    DOB: 09-13-1922, 82 y.o.   MRN: [id number]  HPI This patient presents to the office for care of her nails.  She has not trimmed her nails for months and they have grown long and thick especially her big toenails both feet.Her nails are painful walking and wearing her shoes. The patient presents here today for B/L toenail trim and nails that are discolored.      Review of Systems  All other systems reviewed and are negative.      Objective:   Physical Exam GENERAL APPEARANCE: Alert, conversant. Appropriately groomed. No acute distress.  VASCULAR: Pedal pulses palpable at  [facility] and PT bilateral.  Capillary refill time is immediate to all digits,  Normal temperature gradient.  Digital hair growth is present bilateral  NEUROLOGIC: sensation is normal to 5.07 monofilament at 5/5 sites bilateral.  Light touch is intact bilateral, Muscle strength normal.  MUSCULOSKELETAL: acceptable muscle strength, tone and stability bilateral.  Intrinsic muscluature intact bilateral.  Rectus appearance of foot and digits noted bilateral. Healing midfoot left foot. From previous liz-Frank injury.  DERMATOLOGIC: skin color, texture, and turgor are within normal limits.  No preulcerative lesions or ulcers  are seen, no interdigital maceration noted.  No open lesions present. . No drainage noted.  NAILS  Thick disfigured discolored nails both feet.         Assessment & Plan:  Onychomycosis  B/L  Debridement and grinding of long thick painful nails both feet.Gardiner Barefoot DPM

## 2015-10-17 NOTE — Addendum Note (Signed)
Addended by: Ezzard Flax, Hayzen Lorenson L on: 10/17/2015 11:00 AM   Modules accepted: Medications

## 2015-10-23 ENCOUNTER — Ambulatory Visit
Admission: RE | Admit: 2015-10-23 | Discharge: 2015-10-23 | Disposition: A | Discharge status: Home / Self Care | Payer: Medicare Other | Source: Ambulatory Visit | Attending: Internal Medicine | Admitting: Internal Medicine

## 2015-10-23 DIAGNOSIS — I6523 Occlusion and stenosis of bilateral carotid arteries: Secondary | ICD-10-CM | POA: Diagnosis not present

## 2015-10-23 DIAGNOSIS — R0989 Other specified symptoms and signs involving the circulatory and respiratory systems: Secondary | ICD-10-CM

## 2015-10-30 ENCOUNTER — Other Ambulatory Visit: Payer: Self-pay | Admitting: Cardiology

## 2015-11-14 DIAGNOSIS — H6123 Impacted cerumen, bilateral: Secondary | ICD-10-CM | POA: Diagnosis not present

## 2015-12-06 ENCOUNTER — Other Ambulatory Visit: Payer: Self-pay | Admitting: Cardiology

## 2015-12-10 ENCOUNTER — Other Ambulatory Visit: Payer: Self-pay | Admitting: Cardiology

## 2015-12-25 DIAGNOSIS — H6523 Chronic serous otitis media, bilateral: Secondary | ICD-10-CM | POA: Diagnosis not present

## 2015-12-25 DIAGNOSIS — J039 Acute tonsillitis, unspecified: Secondary | ICD-10-CM | POA: Diagnosis not present

## 2015-12-25 DIAGNOSIS — R490 Dysphonia: Secondary | ICD-10-CM | POA: Diagnosis not present

## 2015-12-26 ENCOUNTER — Telehealth: Payer: Self-pay | Admitting: Cardiology

## 2015-12-26 NOTE — Telephone Encounter (Signed)
Returned call to patient-advised that there are no contraindications with current medications and clarithromycin per pharmacist Georgina Peer.  Pt reports she will start taking medication tomorrow morning.  Advised to call with any concerns or questions.  Pt verbalized understanding.

## 2015-12-26 NOTE — Telephone Encounter (Signed)
No contraindications. Her medications are ok with clarithromycin.

## 2015-12-26 NOTE — Telephone Encounter (Signed)
New message   Pt is calling because Dr.Newman gave her a new medication, Clarithromycin 500mg  2x day  She wants to know if it is safe to take

## 2016-01-01 DIAGNOSIS — R5383 Other fatigue: Secondary | ICD-10-CM | POA: Diagnosis not present

## 2016-01-08 DIAGNOSIS — H6993 Unspecified Eustachian tube disorder, bilateral: Secondary | ICD-10-CM | POA: Diagnosis not present

## 2016-01-08 DIAGNOSIS — J310 Chronic rhinitis: Secondary | ICD-10-CM | POA: Diagnosis not present

## 2016-01-09 ENCOUNTER — Encounter: Payer: Self-pay | Admitting: Podiatry

## 2016-01-09 ENCOUNTER — Ambulatory Visit (INDEPENDENT_AMBULATORY_CARE_PROVIDER_SITE_OTHER): Payer: Medicare Other | Admitting: Podiatry

## 2016-01-09 DIAGNOSIS — L84 Corns and callosities: Secondary | ICD-10-CM | POA: Diagnosis not present

## 2016-01-09 DIAGNOSIS — M79676 Pain in unspecified toe(s): Secondary | ICD-10-CM | POA: Diagnosis not present

## 2016-01-09 DIAGNOSIS — M79674 Pain in right toe(s): Secondary | ICD-10-CM | POA: Diagnosis not present

## 2016-01-09 DIAGNOSIS — B351 Tinea unguium: Secondary | ICD-10-CM | POA: Diagnosis not present

## 2016-01-09 NOTE — Progress Notes (Signed)
   Subjective:    Patient ID: Jasmin Lee, female    DOB: Mar 19, 1923, 82 y.o.   MRN: [id number]  HPI This patient presents to the office for care of her nails.  Her nails have grown thick and long and she has corn second toe left..Her nails are painful walking and wearing her shoes. This patient presents for preventive foot care services.       Review of Systems  All other systems reviewed and are negative.      Objective:   Physical Exam GENERAL APPEARANCE: Alert, conversant. Appropriately groomed. No acute distress.  VASCULAR: Pedal pulses palpable at  [facility] and PT bilateral.  Capillary refill time is immediate to all digits,  Normal temperature gradient.  Digital hair growth is present bilateral  NEUROLOGIC: sensation is normal to 5.07 monofilament at 5/5 sites bilateral.  Light touch is intact bilateral, Muscle strength normal.  MUSCULOSKELETAL: acceptable muscle strength, tone and stability bilateral.  Intrinsic muscluature intact bilateral.  Rectus appearance of foot and digits noted bilateral.   DERMATOLOGIC: skin color, texture, and turgor are within normal limits.  No preulcerative lesions or ulcers  are seen, no interdigital maceration noted.  No open lesions present. . No drainage noted. Distal clavi second toe left foot.  NAILS  Thick disfigured discolored nails both feet.         Assessment & Plan:  Onychomycosis  B/L  Debridement and grinding of long thick painful nails both feet.Gardiner Barefoot DPM

## 2016-01-22 DIAGNOSIS — J309 Allergic rhinitis, unspecified: Secondary | ICD-10-CM | POA: Diagnosis not present

## 2016-01-22 DIAGNOSIS — J4530 Mild persistent asthma, uncomplicated: Secondary | ICD-10-CM | POA: Diagnosis not present

## 2016-01-23 ENCOUNTER — Other Ambulatory Visit: Payer: Self-pay

## 2016-02-27 DIAGNOSIS — R600 Localized edema: Secondary | ICD-10-CM | POA: Diagnosis not present

## 2016-02-27 DIAGNOSIS — Z23 Encounter for immunization: Secondary | ICD-10-CM | POA: Diagnosis not present

## 2016-02-27 DIAGNOSIS — I10 Essential (primary) hypertension: Secondary | ICD-10-CM | POA: Diagnosis not present

## 2016-03-04 DIAGNOSIS — J039 Acute tonsillitis, unspecified: Secondary | ICD-10-CM | POA: Diagnosis not present

## 2016-03-04 DIAGNOSIS — H6523 Chronic serous otitis media, bilateral: Secondary | ICD-10-CM | POA: Diagnosis not present

## 2016-03-04 DIAGNOSIS — J029 Acute pharyngitis, unspecified: Secondary | ICD-10-CM | POA: Diagnosis not present

## 2016-03-09 ENCOUNTER — Other Ambulatory Visit: Payer: Self-pay | Admitting: Cardiology

## 2016-03-18 DIAGNOSIS — R490 Dysphonia: Secondary | ICD-10-CM | POA: Diagnosis not present

## 2016-03-18 DIAGNOSIS — J3501 Chronic tonsillitis: Secondary | ICD-10-CM | POA: Diagnosis not present

## 2016-04-02 ENCOUNTER — Ambulatory Visit: Payer: Medicare Other | Admitting: Podiatry

## 2016-04-09 DIAGNOSIS — Z01419 Encounter for gynecological examination (general) (routine) without abnormal findings: Secondary | ICD-10-CM | POA: Diagnosis not present

## 2016-04-09 DIAGNOSIS — Z6822 Body mass index (BMI) 22.0-22.9, adult: Secondary | ICD-10-CM | POA: Diagnosis not present

## 2016-04-15 DIAGNOSIS — J029 Acute pharyngitis, unspecified: Secondary | ICD-10-CM | POA: Diagnosis not present

## 2016-05-21 ENCOUNTER — Encounter: Payer: Self-pay | Admitting: Podiatry

## 2016-05-21 ENCOUNTER — Ambulatory Visit (INDEPENDENT_AMBULATORY_CARE_PROVIDER_SITE_OTHER): Payer: Medicare Other | Admitting: Podiatry

## 2016-05-21 VITALS — Ht <= 58 in | Wt 114.0 lb

## 2016-05-21 DIAGNOSIS — M79674 Pain in right toe(s): Secondary | ICD-10-CM | POA: Diagnosis not present

## 2016-05-21 DIAGNOSIS — B351 Tinea unguium: Secondary | ICD-10-CM | POA: Diagnosis not present

## 2016-05-21 NOTE — Progress Notes (Signed)
   Subjective:    Patient ID: OVIDA MATHE, female    DOB: 10/26/1922, 82 y.o.   MRN: [id number]  HPI This patient presents to the office for care of her nails.  Her nails have grown thick and long and she has corn second toe left..Her nails are painful walking and wearing her shoes. This patient presents for preventive foot care services.       Review of Systems  All other systems reviewed and are negative.      Objective:   Physical Exam GENERAL APPEARANCE: Alert, conversant. Appropriately groomed. No acute distress.  VASCULAR: Pedal pulses palpable at  [facility] and PT bilateral.  Capillary refill time is immediate to all digits,  Normal temperature gradient.  Digital hair growth is present bilateral  NEUROLOGIC: sensation is normal to 5.07 monofilament at 5/5 sites bilateral.  Light touch is intact bilateral, Muscle strength normal.  MUSCULOSKELETAL: acceptable muscle strength, tone and stability bilateral.  Intrinsic muscluature intact bilateral.  Rectus appearance of foot and digits noted bilateral.   DERMATOLOGIC: skin color, texture, and turgor are within normal limits.  No preulcerative lesions or ulcers  are seen, no interdigital maceration noted.  No open lesions present. . No drainage noted. Distal clavi second toe left foot.  NAILS  Thick disfigured discolored nails both feet.         Assessment & Plan:  Onychomycosis  B/L  Debridement and grinding of long thick painful nails both feet.Gardiner Barefoot DPM

## 2016-06-07 ENCOUNTER — Other Ambulatory Visit: Payer: Self-pay | Admitting: Cardiology

## 2016-06-23 ENCOUNTER — Emergency Department (HOSPITAL_COMMUNITY)
Admission: EM | Admit: 2016-06-23 | Discharge: 2016-06-23 | Disposition: A | Discharge status: Home / Self Care | Payer: Medicare HMO | Attending: Emergency Medicine | Admitting: Emergency Medicine

## 2016-06-23 ENCOUNTER — Emergency Department (HOSPITAL_BASED_OUTPATIENT_CLINIC_OR_DEPARTMENT_OTHER)
Admit: 2016-06-23 | Discharge: 2016-06-23 | Disposition: A | Discharge status: Home / Self Care | Payer: Medicare HMO | Attending: Emergency Medicine | Admitting: Emergency Medicine

## 2016-06-23 ENCOUNTER — Encounter (HOSPITAL_COMMUNITY): Payer: Self-pay | Admitting: Emergency Medicine

## 2016-06-23 DIAGNOSIS — M79605 Pain in left leg: Secondary | ICD-10-CM | POA: Diagnosis not present

## 2016-06-23 DIAGNOSIS — J449 Chronic obstructive pulmonary disease, unspecified: Secondary | ICD-10-CM | POA: Diagnosis not present

## 2016-06-23 DIAGNOSIS — I82812 Embolism and thrombosis of superficial veins of left lower extremities: Secondary | ICD-10-CM | POA: Diagnosis not present

## 2016-06-23 DIAGNOSIS — Z79899 Other long term (current) drug therapy: Secondary | ICD-10-CM | POA: Insufficient documentation

## 2016-06-23 DIAGNOSIS — Z8541 Personal history of malignant neoplasm of cervix uteri: Secondary | ICD-10-CM | POA: Diagnosis not present

## 2016-06-23 DIAGNOSIS — M79662 Pain in left lower leg: Secondary | ICD-10-CM | POA: Diagnosis present

## 2016-06-23 DIAGNOSIS — R030 Elevated blood-pressure reading, without diagnosis of hypertension: Secondary | ICD-10-CM | POA: Diagnosis not present

## 2016-06-23 DIAGNOSIS — M79609 Pain in unspecified limb: Secondary | ICD-10-CM | POA: Diagnosis not present

## 2016-06-23 DIAGNOSIS — I10 Essential (primary) hypertension: Secondary | ICD-10-CM | POA: Insufficient documentation

## 2016-06-23 MED ORDER — DICLOFENAC SODIUM 1 % TD GEL
2.0000 g | Freq: Four times a day (QID) | TRANSDERMAL | Status: DC
  Administered 2016-06-23: 2 g via TOPICAL
  Filled 2016-06-23: qty 100

## 2016-06-23 MED ORDER — ACETAMINOPHEN 325 MG PO TABS
650.0000 mg | ORAL_TABLET | Freq: Once | ORAL | Status: DC
  Filled 2016-06-23: qty 2

## 2016-06-23 NOTE — Progress Notes (Signed)
**  Preliminary report by tech**  Left lower extremity venous duplex complete. There is evidence of chronic superficial vein thrombosis involving the lesser saphenous vein of the left lower extremity. There is no evidence of deep vein thrombosis involving the left lower extremity. There is no evidence of a Baker's cyst on the left Results were given to the patient's nurse, Valetta Fuller.  06/23/16 12:03 PM Jasmin Lee RVT

## 2016-06-23 NOTE — ED Notes (Signed)
Bed: WA21 Expected date:  Expected time:  Means of arrival:  Comments: EMS  

## 2016-06-23 NOTE — ED Provider Notes (Signed)
[facility] DEPT Provider Note   CSN: [id number] Arrival date & time: 06/23/16  1010     History   Chief Complaint Chief Complaint  Patient presents with  . Leg Pain    left calf     HPI Jasmin Lee is a 83 y.o. female.  The history is provided by the patient.  Leg Pain   This is a new problem. Episode onset: 7 days ago. The problem occurs constantly. The problem has not changed since onset.The pain is present in the left lower leg. The quality of the pain is described as aching (intermittent). The pain is at a severity of 4/10. The pain is moderate. Pertinent negatives include no numbness, full range of motion, no stiffness and no tingling. Exacerbated by: seems to be worse with activity but sometimes comes on at rest. She has tried OTC ointments and heat (tylenol) for the symptoms. Improvement on treatment: only mild relief for a short time. There has been no history of extremity trauma (but did start after cleaning out a freezer.  she was placing things in upper shelves and carring cooler with frozen foods).    Past Medical History:  Diagnosis Date  . Anemia   . Arthritis    "in my legs and feet" (05/11/2014)  . Cardiomyopathy ([place])   . Chronic bronchitis ([place]) "2-3 times/yr"  . Complication of anesthesia    "started flinching at start of hysterectomy; they had to give me more anesthetic"  . COPD with asthma ([place])   . Dyslipidemia   . Echocardiogram abnormal 12/05/2010   Moderate concentric left ventricular hypertrophy EF > XX123456 stage I diastolic dysfunction, left atrium mild to moderate dilatation, moderate mitral annular calcification with trace MR. Trace TR.  Marland Kitchen HTN (hypertension)   . Pneumonia "several times"  . Squamous carcinoma    "left face and thigh"  . Uterine cancer [facility])     Patient Active Problem List   Diagnosis Date Noted  . Abnormal EKG 11/15/2014  . Ankle edema 11/15/2014  . Extrinsic asthma 05/12/2014  . Chest pressure 05/11/2014  . Chest  pain 05/11/2014  . Weakness 06/21/2013  . Hyponatremia 06/11/2013  . COPD (chronic obstructive pulmonary disease) ([place]) 06/11/2013  . Acute renal failure ([place]) 06/11/2013  . Hypokalemia 06/11/2013  . Anemia 06/11/2013  . Essential hypertension   . Dyslipidemia     Past Surgical History:  Procedure Laterality Date  . APPENDECTOMY  1954  . BREAST BIOPSY Left    "it was nothing"  . CARDIAC EVENT MONITOR  12/05/10-01/02/11   SINUS RHYTHM  . CATARACT EXTRACTION W/ INTRAOCULAR LENS  IMPLANT, BILATERAL Bilateral   . CHOLECYSTECTOMY  2003  . DILATION AND CURETTAGE OF UTERUS  1966  . SQUAMOUS CELL CARCINOMA EXCISION Left    face and thigh  . TRANSTHORACIC ECHOCARDIOGRAM  December 05, 2010   moderate cocentric LVH WITH STAGE 1 IMPARIED DIASTOLIC DYSFUNCTION , NORMAL EF AND NORMAL LV PRESSURES. LEFT ATRIUM WAS ONLY MILD TO MODERATELY DILATED .MOD MITRAL CALCIFICATION WITH ONLY MILD REGURGITATION.,OTHERWISE RELATIVELY NORMAL  . VAGINAL HYSTERECTOMY  1966    OB History    No data available       Home Medications    Prior to Admission medications   Medication Sig Start Date End Date Taking? Authorizing Provider  acetaminophen (TYLENOL) 500 MG tablet Take 1,000 mg by mouth every 6 (six) hours as needed.    Historical Provider, MD  ADVAIR DISKUS 250-50 MCG/DOSE AEPB Inhale 1 puff into the  lungs 2 (two) times daily.  01/26/13   Historical Provider, MD  albuterol (ACCUNEB) 0.63 MG/3ML nebulizer solution Take 1 ampule by nebulization every 6 (six) hours as needed for wheezing.    Historical Provider, MD  Artificial Tear Ointment (DRY EYES OP) Apply 1 drop to eye 4 (four) times daily as needed (dry eyes).    Historical Provider, MD  atorvastatin (LIPITOR) 10 MG tablet Take 10 mg by mouth every other day.  01/30/13   Historical Provider, MD  Azelastine HCl 0.15 % SOLN Place 2 sprays into the nose daily. 12/03/12   Historical Provider, MD  CALCIUM PO Take 1 tablet by mouth daily.    Historical Provider,  MD  Cholecalciferol (VITAMIN D PO) Take 1 tablet by mouth daily.    Historical Provider, MD  Cyanocobalamin (VITAMIN B-12 PO) Take 1 tablet by mouth daily.    Historical Provider, MD  DILT-XR 120 MG 24 hr capsule TAKE 1 CAPSULE DAILY 12/06/15   [name], MD  hydrALAZINE (APRESOLINE) 10 MG tablet TAKE 5 TABLETS DAILY (2 TABLETS IN THE MORNING, 1 TABLET IN THE AFTERNOON AND 2 TABLETS IN THE EVENING) 10/30/15   [name], MD  losartan (COZAAR) 50 MG tablet TAKE 2 TABLETS IN THE MORNING AND 1 TABLET IN THE EVENING 06/08/16   [name], MD  montelukast (SINGULAIR) 10 MG tablet Take 10 mg by mouth at bedtime.  12/03/12   Historical Provider, MD  Multiple Vitamin (MULTIVITAMIN WITH MINERALS) TABS tablet Take 1 tablet by mouth daily.    Historical Provider, MD  pantoprazole (PROTONIX) 40 MG tablet Take 1 tablet (40 mg total) by mouth daily. 06/13/13   [name], MD  VENTOLIN HFA 108 (90 BASE) MCG/ACT inhaler Inhale 2 puffs into the lungs every 4 (four) hours as needed for wheezing or shortness of breath.  01/26/13   Historical Provider, MD  vitamin C (ASCORBIC ACID) 250 MG tablet Take 250 mg by mouth daily.    Historical Provider, MD    Family History Family History  Problem Relation Age of Onset  . Heart attack Mother   . Cancer Father   . Heart attack Maternal Grandmother   . Heart disease Son   . Asthma Daughter     Social History Social History  Substance Use Topics  . Smoking status: Never Smoker  . Smokeless tobacco: Never Used  . Alcohol use No     Allergies   Asa [aspirin]; Lisinopril; Penicillins; and Keflex [cephalexin]   Review of Systems Review of Systems  Musculoskeletal: Negative for stiffness.  Neurological: Negative for tingling and numbness.  All other systems reviewed and are negative.    Physical Exam Updated Vital Signs BP 158/69 (BP Location: Right Arm)   Pulse 72   Temp 98.2 F (36.8 C) (Oral)   Resp 16   Ht 4\' 10"  (1.473 m)   Wt 116 lb  (52.6 kg)   SpO2 100%   BMI 24.24 kg/m   Physical Exam  Constitutional: She is oriented to person, place, and time. She appears well-developed and well-nourished. No distress.  HENT:  Head: Normocephalic and atraumatic.  Eyes: EOM are normal. Pupils are equal, round, and reactive to light.  Cardiovascular: Normal rate.   Pulmonary/Chest: Effort normal.  Musculoskeletal: She exhibits tenderness.       Legs: 2+ DP pulse in the left foot. Normal sensation and movement of the foot. No notable swelling of the left lower extremity. Full range of motion of the  knee without pain. Small area of ecchymosis on the lower anterior shin but no bruising to the path of the leg. Apparently ecchymosis just started this morning  Neurological: She is alert and oriented to person, place, and time.  Skin: Skin is warm and dry. Capillary refill takes less than 2 seconds.  Psychiatric: She has a normal mood and affect. Her behavior is normal.  Nursing note and vitals reviewed.    ED Treatments / Results  Labs (all labs ordered are listed, but only abnormal results are displayed) Labs Reviewed - No data to display  EKG  EKG Interpretation None      Left lower extremity venous duplex complete. There is evidence of chronic superficial vein thrombosis involving the lesser saphenous vein of the left lower extremity. There is no evidence of deep vein thrombosis involving the left lower extremity. There is no evidence of a Baker's cyst on the left Results were given to the patient's nurse, Valetta Fuller.  Radiology No results found.  Procedures Procedures (including critical care time)  Medications Ordered in ED Medications  diclofenac sodium (VOLTAREN) 1 % transdermal gel 2 g (not administered)     Initial Impression / Assessment and Plan / ED Course  I have reviewed the triage vital signs and the nursing notes.  Pertinent labs & imaging results that were available during my care of the patient were  reviewed by me and considered in my medical decision making (see chart for details).     Patient with 7 days of calf pain that started after she was cleaning out a freezer. She has been using capsaicin cream intermittently with heat and Tylenol without significant improvement. She denies any swelling, numbness or weakness. The pain has been in the same spot without moving. She denies any known trauma prior history of DVT and no recent immobilization. On exam patient has proximal calf tenderness as well as spasm of the gastrocnemius. Will do a Doppler study to rule out DVT. Lower suspicion for Baker's cyst as patient has no knee pain.  1:20 PM Ultrasound showing no DVT but chronic superficial vein thrombosis in the lesser saphenous vein. This could be the cause of patient's pain or muscle tear. Patient given compression stocking of Voltaren gel.  Patient encouraged to follow-up with her PCP  Final Clinical Impressions(s) / ED Diagnoses   Final diagnoses:  Left leg pain  Embolism from left lesser saphenous vein    New Prescriptions New Prescriptions   No medications on file     Blanchie Dessert, MD 06/23/16 1326

## 2016-06-23 NOTE — ED Notes (Signed)
Pharmacy called for voltaren medication. States will send in next few minutes.

## 2016-06-23 NOTE — ED Triage Notes (Signed)
Per [facility] pt from home reports been having left calf pain x 1 week, no fall nor injury . No edema noted by EMS . Denies shortness of breath nor chest pain. Pt reports was cleaning freezer when pain started, 1 week ago. A and O x 4. Ambulatory.

## 2016-06-30 ENCOUNTER — Emergency Department (HOSPITAL_COMMUNITY)
Admission: EM | Admit: 2016-06-30 | Discharge: 2016-06-30 | Disposition: A | Discharge status: Home / Self Care | Payer: Medicare HMO | Attending: Emergency Medicine | Admitting: Emergency Medicine

## 2016-06-30 ENCOUNTER — Encounter (HOSPITAL_COMMUNITY): Payer: Self-pay | Admitting: Emergency Medicine

## 2016-06-30 ENCOUNTER — Emergency Department (HOSPITAL_COMMUNITY): Payer: Medicare HMO

## 2016-06-30 DIAGNOSIS — I10 Essential (primary) hypertension: Secondary | ICD-10-CM | POA: Insufficient documentation

## 2016-06-30 DIAGNOSIS — S0990XA Unspecified injury of head, initial encounter: Secondary | ICD-10-CM

## 2016-06-30 DIAGNOSIS — S0101XA Laceration without foreign body of scalp, initial encounter: Secondary | ICD-10-CM | POA: Diagnosis not present

## 2016-06-30 DIAGNOSIS — Y999 Unspecified external cause status: Secondary | ICD-10-CM | POA: Insufficient documentation

## 2016-06-30 DIAGNOSIS — J449 Chronic obstructive pulmonary disease, unspecified: Secondary | ICD-10-CM | POA: Diagnosis not present

## 2016-06-30 DIAGNOSIS — S61210A Laceration without foreign body of right index finger without damage to nail, initial encounter: Secondary | ICD-10-CM | POA: Diagnosis not present

## 2016-06-30 DIAGNOSIS — Y92009 Unspecified place in unspecified non-institutional (private) residence as the place of occurrence of the external cause: Secondary | ICD-10-CM | POA: Insufficient documentation

## 2016-06-30 DIAGNOSIS — S098XXA Other specified injuries of head, initial encounter: Secondary | ICD-10-CM | POA: Diagnosis not present

## 2016-06-30 DIAGNOSIS — S199XXA Unspecified injury of neck, initial encounter: Secondary | ICD-10-CM | POA: Diagnosis not present

## 2016-06-30 DIAGNOSIS — S0181XA Laceration without foreign body of other part of head, initial encounter: Secondary | ICD-10-CM | POA: Diagnosis not present

## 2016-06-30 DIAGNOSIS — W0110XA Fall on same level from slipping, tripping and stumbling with subsequent striking against unspecified object, initial encounter: Secondary | ICD-10-CM | POA: Insufficient documentation

## 2016-06-30 DIAGNOSIS — Y9389 Activity, other specified: Secondary | ICD-10-CM | POA: Insufficient documentation

## 2016-06-30 DIAGNOSIS — W19XXXA Unspecified fall, initial encounter: Secondary | ICD-10-CM

## 2016-06-30 DIAGNOSIS — Z8542 Personal history of malignant neoplasm of other parts of uterus: Secondary | ICD-10-CM | POA: Diagnosis not present

## 2016-06-30 DIAGNOSIS — Z79899 Other long term (current) drug therapy: Secondary | ICD-10-CM | POA: Insufficient documentation

## 2016-06-30 MED ORDER — LIDOCAINE HCL 2 % IJ SOLN
20.0000 mL | Freq: Once | INTRAMUSCULAR | Status: AC
  Administered 2016-06-30: 400 mg via INTRADERMAL
  Filled 2016-06-30: qty 20

## 2016-06-30 MED ORDER — TETANUS-DIPHTH-ACELL PERTUSSIS 5-2.5-18.5 LF-MCG/0.5 IM SUSP
0.5000 mL | Freq: Once | INTRAMUSCULAR | Status: AC
  Administered 2016-06-30: 0.5 mL via INTRAMUSCULAR
  Filled 2016-06-30: qty 0.5

## 2016-06-30 NOTE — ED Notes (Signed)
Patient went to CT

## 2016-06-30 NOTE — Discharge Planning (Signed)
Pt up for discharge. EDCM reviewed chart for possible CM needs.  No needs identified or communicated.  

## 2016-06-30 NOTE — ED Notes (Signed)
PA at the bedside suturing patient.

## 2016-06-30 NOTE — Discharge Instructions (Signed)
Return here as needed.  Follow-up with your primary care doctor for suture removal in 7 days.  Keep the areas clean and dry

## 2016-06-30 NOTE — ED Provider Notes (Signed)
[facility] DEPT Provider Note   CSN: [id number] Arrival date & time: 06/30/16  [id number]     History   Chief Complaint Chief Complaint  Patient presents with  . Fall  . Head Injury    HPI Jasmin Lee is a 83 y.o. female.  HPI Patient presents to the emergency department with a fall that occurred just prior to arrival.  Patient states she tripped and fell striking a bookcase.  She states that she has a laceration to the top of her head and her right index finger.  Patient states that nothing seems make the condition better or worse.  She states that she did apply pressure to the wound and called EMS.The patient denies chest pain, shortness of breath,blurred vision, neck pain, fever, cough, weakness, numbness, dizziness, anorexia, edema, abdominal pain, nausea, vomiting, diarrhea, rash, back pain, dysuria, hematemesis, bloody stool, near syncope, or syncope. Past Medical History:  Diagnosis Date  . Anemia   . Arthritis    "in my legs and feet" (05/11/2014)  . Cardiomyopathy ([place])   . Chronic bronchitis ([place]) "2-3 times/yr"  . Complication of anesthesia    "started flinching at start of hysterectomy; they had to give me more anesthetic"  . COPD with asthma ([place])   . Dyslipidemia   . Echocardiogram abnormal 12/05/2010   Moderate concentric left ventricular hypertrophy EF > XX123456 stage I diastolic dysfunction, left atrium mild to moderate dilatation, moderate mitral annular calcification with trace MR. Trace TR.  Marland Kitchen HTN (hypertension)   . Pneumonia "several times"  . Squamous carcinoma    "left face and thigh"  . Uterine cancer [facility])     Patient Active Problem List   Diagnosis Date Noted  . Abnormal EKG 11/15/2014  . Ankle edema 11/15/2014  . Extrinsic asthma 05/12/2014  . Chest pressure 05/11/2014  . Chest pain 05/11/2014  . Weakness 06/21/2013  . Hyponatremia 06/11/2013  . COPD (chronic obstructive pulmonary disease) ([place]) 06/11/2013  . Acute renal failure ([place])  06/11/2013  . Hypokalemia 06/11/2013  . Anemia 06/11/2013  . Essential hypertension   . Dyslipidemia     Past Surgical History:  Procedure Laterality Date  . APPENDECTOMY  1954  . BREAST BIOPSY Left    "it was nothing"  . CARDIAC EVENT MONITOR  12/05/10-01/02/11   SINUS RHYTHM  . CATARACT EXTRACTION W/ INTRAOCULAR LENS  IMPLANT, BILATERAL Bilateral   . CHOLECYSTECTOMY  2003  . DILATION AND CURETTAGE OF UTERUS  1966  . SQUAMOUS CELL CARCINOMA EXCISION Left    face and thigh  . TRANSTHORACIC ECHOCARDIOGRAM  December 05, 2010   moderate cocentric LVH WITH STAGE 1 IMPARIED DIASTOLIC DYSFUNCTION , NORMAL EF AND NORMAL LV PRESSURES. LEFT ATRIUM WAS ONLY MILD TO MODERATELY DILATED .MOD MITRAL CALCIFICATION WITH ONLY MILD REGURGITATION.,OTHERWISE RELATIVELY NORMAL  . VAGINAL HYSTERECTOMY  1966    OB History    No data available       Home Medications    Prior to Admission medications   Medication Sig Start Date End Date Taking? Authorizing Provider  acetaminophen (TYLENOL) 500 MG tablet Take 1,000 mg by mouth every 6 (six) hours as needed for mild pain, moderate pain, fever or headache.     Historical Provider, MD  ADVAIR DISKUS 250-50 MCG/DOSE AEPB Inhale 1 puff into the lungs 2 (two) times daily.  01/26/13   Historical Provider, MD  albuterol (ACCUNEB) 0.63 MG/3ML nebulizer solution Take 1 ampule by nebulization every 3 (three) days.     Historical Provider, MD  Artificial Tear Ointment (DRY EYES OP) Place 1 drop into both eyes 3 (three) times daily.     Historical Provider, MD  atorvastatin (LIPITOR) 10 MG tablet Take 10 mg by mouth every other day.  01/30/13   Historical Provider, MD  Azelastine HCl 0.15 % SOLN Place 2 sprays into the nose daily. 12/03/12   Historical Provider, MD  Cyanocobalamin (VITAMIN B-12 PO) Take 1 tablet by mouth daily with breakfast.     Historical Provider, MD  DILT-XR 120 MG 24 hr capsule TAKE 1 CAPSULE DAILY 12/06/15   [name], MD  hydrALAZINE  (APRESOLINE) 10 MG tablet TAKE 5 TABLETS DAILY (2 TABLETS IN THE MORNING, 1 TABLET IN THE AFTERNOON AND 2 TABLETS IN THE EVENING) Patient taking differently: Takes 2 TABLETS IN THE MORNING, 1 TABLET IN THE AFTERNOON AND 2 TABLETS IN THE EVENING 10/30/15   [name], MD  hydrochlorothiazide (HYDRODIURIL) 25 MG tablet Take 25 mg by mouth daily.    Historical Provider, MD  losartan (COZAAR) 50 MG tablet TAKE 2 TABLETS IN THE MORNING AND 1 TABLET IN THE EVENING 06/08/16   [name], MD  montelukast (SINGULAIR) 10 MG tablet Take 10 mg by mouth at bedtime.  12/03/12   Historical Provider, MD  Multiple Vitamin (MULTIVITAMIN WITH MINERALS) TABS tablet Take 1 tablet by mouth daily with breakfast.     Historical Provider, MD  pantoprazole (PROTONIX) 40 MG tablet Take 1 tablet (40 mg total) by mouth daily. Patient not taking: Reported on 06/23/2016 06/13/13   [name], MD  VENTOLIN HFA 108 (90 BASE) MCG/ACT inhaler Inhale 2 puffs into the lungs every 4 (four) hours as needed for wheezing or shortness of breath.  01/26/13   Historical Provider, MD  vitamin C (ASCORBIC ACID) 250 MG tablet Take 250 mg by mouth daily.    Historical Provider, MD    Family History Family History  Problem Relation Age of Onset  . Heart attack Mother   . Cancer Father   . Heart attack Maternal Grandmother   . Heart disease Son   . Asthma Daughter     Social History Social History  Substance Use Topics  . Smoking status: Never Smoker  . Smokeless tobacco: Never Used  . Alcohol use No     Allergies   Almond (diagnostic); Asa [aspirin]; Coconut flavor; Neomycin; Peanut-containing drug products; Penicillins; Sulfa antibiotics; Tizanidine; Lisinopril; and Keflex [cephalexin]   Review of Systems Review of Systems All other systems negative except as documented in the HPI. All pertinent positives and negatives as reviewed in the HPI.  Physical Exam Updated Vital Signs BP 182/70 (BP Location: Right Arm)    Pulse 71   Temp 98.9 F (37.2 C) (Oral)   Resp 19   Ht 4\' 10"  (1.473 m) Comment: Simultaneous filing. User may not have seen previous data.  Wt 52.6 kg Comment: Simultaneous filing. User may not have seen previous data.  SpO2 100%   BMI 24.24 kg/m   Physical Exam  Constitutional: She is oriented to person, place, and time. She appears well-developed and well-nourished. No distress.  HENT:  Head: Normocephalic.    Mouth/Throat: Oropharynx is clear and moist.  Eyes: Pupils are equal, round, and reactive to light.  Neck: Normal range of motion. Neck supple.  Cardiovascular: Normal rate, regular rhythm and normal heart sounds.  Exam reveals no gallop and no friction rub.   No murmur heard. Pulmonary/Chest: Effort normal and breath sounds normal. No respiratory distress. She  has no wheezes.  Musculoskeletal:       Left hand: She exhibits laceration. She exhibits normal range of motion and normal capillary refill. Normal sensation noted. Normal strength noted.       Hands: Neurological: She is alert and oriented to person, place, and time. She exhibits normal muscle tone. Coordination normal.  Skin: Skin is warm and dry. Capillary refill takes less than 2 seconds. No rash noted. No erythema.  Psychiatric: She has a normal mood and affect. Her behavior is normal.  Nursing note and vitals reviewed.    ED Treatments / Results  Labs (all labs ordered are listed, but only abnormal results are displayed) Labs Reviewed - No data to display  EKG  EKG Interpretation None       Radiology Ct Head Wo Contrast  Result Date: 06/30/2016 CLINICAL DATA:  Status post fall at home with a blow to the head on a coffee table. Initial encounter. EXAM: CT HEAD WITHOUT CONTRAST CT CERVICAL SPINE WITHOUT CONTRAST TECHNIQUE: Multidetector CT imaging of the head and cervical spine was performed following the standard protocol without intravenous contrast. Multiplanar CT image reconstructions of the  cervical spine were also generated. COMPARISON:  None. FINDINGS: CT HEAD FINDINGS Brain: There is some cortical atrophy. No acute abnormality including hemorrhage, infarct, mass lesion mass effect, midline shift or abnormal extra-axial fluid collection is identified. No hydrocephalus or pneumocephalus. Vascular: Atherosclerotic vascular disease is noted. Skull: Intact. Sinuses/Orbits: Left mastoid effusion is identified. The paranasal sinuses are otherwise clear. The orbits demonstrate no acute abnormality. Other: Scalp laceration anteriorly on the right is noted. No foreign body is identified. CT CERVICAL SPINE FINDINGS Alignment: There is mild reversal of the normal cervical lordosis. Facet degenerative disease results in 0.5 cm anterolisthesis C7 on T1. Trace anterolisthesis C2 on C3 and retrolisthesis C5 on C6 also noted. Skull base and vertebrae: No acute fracture. No primary bone lesion or focal pathologic process. Soft tissues and spinal canal: No prevertebral fluid or swelling. No visible canal hematoma. Disc levels: Multilevel degenerative disc disease and facet arthropathy are seen. Upper chest: Lung apices are clear. Other: None. IMPRESSION: Scalp laceration anteriorly on the right. Negative for fracture or acute intracranial abnormality. No acute abnormality cervical spine. Cortical atrophy. Left mastoid effusion. Atherosclerosis. Multilevel cervical spondylosis. Electronically Signed   By: Inge Rise M.D.   On: 06/30/2016 11:14   Ct Cervical Spine Wo Contrast  Result Date: 06/30/2016 CLINICAL DATA:  Status post fall at home with a blow to the head on a coffee table. Initial encounter. EXAM: CT HEAD WITHOUT CONTRAST CT CERVICAL SPINE WITHOUT CONTRAST TECHNIQUE: Multidetector CT imaging of the head and cervical spine was performed following the standard protocol without intravenous contrast. Multiplanar CT image reconstructions of the cervical spine were also generated. COMPARISON:  None.  FINDINGS: CT HEAD FINDINGS Brain: There is some cortical atrophy. No acute abnormality including hemorrhage, infarct, mass lesion mass effect, midline shift or abnormal extra-axial fluid collection is identified. No hydrocephalus or pneumocephalus. Vascular: Atherosclerotic vascular disease is noted. Skull: Intact. Sinuses/Orbits: Left mastoid effusion is identified. The paranasal sinuses are otherwise clear. The orbits demonstrate no acute abnormality. Other: Scalp laceration anteriorly on the right is noted. No foreign body is identified. CT CERVICAL SPINE FINDINGS Alignment: There is mild reversal of the normal cervical lordosis. Facet degenerative disease results in 0.5 cm anterolisthesis C7 on T1. Trace anterolisthesis C2 on C3 and retrolisthesis C5 on C6 also noted. Skull base and vertebrae: No acute fracture. No  primary bone lesion or focal pathologic process. Soft tissues and spinal canal: No prevertebral fluid or swelling. No visible canal hematoma. Disc levels: Multilevel degenerative disc disease and facet arthropathy are seen. Upper chest: Lung apices are clear. Other: None. IMPRESSION: Scalp laceration anteriorly on the right. Negative for fracture or acute intracranial abnormality. No acute abnormality cervical spine. Cortical atrophy. Left mastoid effusion. Atherosclerosis. Multilevel cervical spondylosis. Electronically Signed   By: Inge Rise M.D.   On: 06/30/2016 11:14    Procedures Procedures (including critical care time)  Medications Ordered in ED Medications  lidocaine (XYLOCAINE) 2 % (with pres) injection 400 mg (400 mg Intradermal Given 06/30/16 1209)     Initial Impression / Assessment and Plan / ED Course  I have reviewed the triage vital signs and the nursing notes.  Pertinent labs & imaging results that were available during my care of the patient were reviewed by me and considered in my medical decision making (see chart for details).     LACERATION  REPAIR Performed by: Brent General Authorized by: Brent General Consent: Verbal consent obtained. Risks and benefits: risks, benefits and alternatives were discussed Consent given by: patient Patient identity confirmed: provided demographic data Prepped and Draped in normal sterile fashion Wound explored  Laceration Location: Right lateral scalp  Laceration Length: 4 cm  No Foreign Bodies seen or palpated  Anesthesia: local infiltration  Local anesthetic: lidocaine 2 % without epinephrine  Anesthetic total: 5 ml  Irrigation method: syringe Amount of cleaning: standard  Skin closure: 4-0 Prolene   Number of sutures: 10  Technique: Simple interrupted   Patient tolerance: Patient tolerated the procedure well with no immediate complications.   LACERATION REPAIR Performed by: Brent General Authorized by: Brent General Consent: Verbal consent obtained. Risks and benefits: risks, benefits and alternatives were discussed Consent given by: patient Patient identity confirmed: provided demographic data Prepped and Draped in normal sterile fashion Wound explored  Laceration Location: Right index finger laterally in the distal portion of the finger  Laceration Length: 1.5 cm  No Foreign Bodies seen or palpated  Anesthesia: local infiltration  Local anesthetic: lidocaine 2% without epinephrine  Anesthetic total: 3 ml  Irrigation method: syringe Amount of cleaning: standard  Skin closure: 4-0 Prolene   Number of sutures: 4   Technique: Simple interrupted   Patient tolerance: Patient tolerated the procedure well with no immediate complications.  Patient will be advised to return here as needed.  Told to sutures out in 7 days.  Told to follow with her primary doctor.  Final Clinical Impressions(s) / ED Diagnoses   Final diagnoses:  None    New Prescriptions New Prescriptions   No medications on file     Dalia Heading,  PA-C 06/30/16 [place], MD 06/30/16 1758

## 2016-06-30 NOTE — ED Triage Notes (Addendum)
Pt in from home via [facility] after mechanical fall this morning, hitting top of head on coffee table. Per EMS, pt sustained 2 in laceration. Pt denies taking any blood thinners. Dressing present on ED arrival, bleeding controlled. Pt awake, a&ox4

## 2016-06-30 NOTE — ED Notes (Signed)
Patient transported to CT 

## 2016-07-08 DIAGNOSIS — I82812 Embolism and thrombosis of superficial veins of left lower extremities: Secondary | ICD-10-CM | POA: Diagnosis not present

## 2016-07-08 DIAGNOSIS — Z4802 Encounter for removal of sutures: Secondary | ICD-10-CM | POA: Diagnosis not present

## 2016-07-16 DIAGNOSIS — Z4802 Encounter for removal of sutures: Secondary | ICD-10-CM | POA: Diagnosis not present

## 2016-07-30 DIAGNOSIS — H353132 Nonexudative age-related macular degeneration, bilateral, intermediate dry stage: Secondary | ICD-10-CM | POA: Diagnosis not present

## 2016-07-30 DIAGNOSIS — H04123 Dry eye syndrome of bilateral lacrimal glands: Secondary | ICD-10-CM | POA: Diagnosis not present

## 2016-08-13 ENCOUNTER — Ambulatory Visit (INDEPENDENT_AMBULATORY_CARE_PROVIDER_SITE_OTHER): Payer: Medicare HMO | Admitting: Podiatry

## 2016-08-13 ENCOUNTER — Encounter: Payer: Self-pay | Admitting: Podiatry

## 2016-08-13 DIAGNOSIS — B351 Tinea unguium: Secondary | ICD-10-CM | POA: Diagnosis not present

## 2016-08-13 DIAGNOSIS — M79674 Pain in right toe(s): Secondary | ICD-10-CM | POA: Diagnosis not present

## 2016-08-13 DIAGNOSIS — L84 Corns and callosities: Secondary | ICD-10-CM

## 2016-08-13 NOTE — Progress Notes (Signed)
   Subjective:    Patient ID: BINTA STATZER, female    DOB: 11-26-1922, 83 y.o.   MRN: [id number]  HPI This patient presents to the office for care of her nails.  Her nails have grown thick and long and she has corn second toe left..Her nails are painful walking and wearing her shoes. This patient presents for preventive foot care services.       Review of Systems  All other systems reviewed and are negative.      Objective:   Physical Exam GENERAL APPEARANCE: Alert, conversant. Appropriately groomed. No acute distress.  VASCULAR: Pedal pulses palpable at  [facility] and PT bilateral.  Capillary refill time is immediate to all digits,  Normal temperature gradient.  Digital hair growth is present bilateral  NEUROLOGIC: sensation is normal to 5.07 monofilament at 5/5 sites bilateral.  Light touch is intact bilateral, Muscle strength normal.  MUSCULOSKELETAL: acceptable muscle strength, tone and stability bilateral.  Intrinsic muscluature intact bilateral.  Rectus appearance of foot and digits noted bilateral.   DERMATOLOGIC: skin color, texture, and turgor are within normal limits.  No preulcerative lesions or ulcers  are seen, no interdigital maceration noted.  No open lesions present. . No drainage noted. Distal clavi second toe left foot. Callus sub 2 left foot.  NAILS  Thick disfigured discolored nails both feet.         Assessment & Plan:  Onychomycosis  B/L  Debridement and grinding of long thick painful nails both feet.Gardiner Barefoot DPM

## 2016-08-20 DIAGNOSIS — L821 Other seborrheic keratosis: Secondary | ICD-10-CM | POA: Diagnosis not present

## 2016-08-20 DIAGNOSIS — L814 Other melanin hyperpigmentation: Secondary | ICD-10-CM | POA: Diagnosis not present

## 2016-08-20 DIAGNOSIS — Z85828 Personal history of other malignant neoplasm of skin: Secondary | ICD-10-CM | POA: Diagnosis not present

## 2016-08-20 DIAGNOSIS — D1801 Hemangioma of skin and subcutaneous tissue: Secondary | ICD-10-CM | POA: Diagnosis not present

## 2016-09-01 ENCOUNTER — Other Ambulatory Visit: Payer: Self-pay | Admitting: Cardiology

## 2016-09-01 NOTE — Telephone Encounter (Signed)
REFILL 

## 2016-09-03 DIAGNOSIS — I10 Essential (primary) hypertension: Secondary | ICD-10-CM | POA: Diagnosis not present

## 2016-09-03 DIAGNOSIS — E872 Acidosis: Secondary | ICD-10-CM | POA: Diagnosis not present

## 2016-09-05 ENCOUNTER — Other Ambulatory Visit: Payer: Self-pay | Admitting: Cardiology

## 2016-09-10 DIAGNOSIS — E871 Hypo-osmolality and hyponatremia: Secondary | ICD-10-CM | POA: Diagnosis not present

## 2016-09-10 DIAGNOSIS — M545 Low back pain: Secondary | ICD-10-CM | POA: Diagnosis not present

## 2016-09-10 DIAGNOSIS — I10 Essential (primary) hypertension: Secondary | ICD-10-CM | POA: Diagnosis not present

## 2016-09-10 DIAGNOSIS — E782 Mixed hyperlipidemia: Secondary | ICD-10-CM | POA: Diagnosis not present

## 2016-10-05 ENCOUNTER — Encounter (HOSPITAL_COMMUNITY): Payer: Self-pay | Admitting: Emergency Medicine

## 2016-10-05 ENCOUNTER — Emergency Department (HOSPITAL_COMMUNITY): Payer: Medicare HMO

## 2016-10-05 ENCOUNTER — Inpatient Hospital Stay (HOSPITAL_COMMUNITY)
Admission: EM | Admit: 2016-10-05 | Discharge: 2016-10-08 | DRG: 552 | Disposition: A | Discharge status: Skilled Nursing Facility | Payer: Medicare HMO | Attending: Internal Medicine | Admitting: Internal Medicine

## 2016-10-05 ENCOUNTER — Inpatient Hospital Stay (HOSPITAL_COMMUNITY): Payer: Medicare HMO

## 2016-10-05 DIAGNOSIS — Z809 Family history of malignant neoplasm, unspecified: Secondary | ICD-10-CM

## 2016-10-05 DIAGNOSIS — Z7951 Long term (current) use of inhaled steroids: Secondary | ICD-10-CM

## 2016-10-05 DIAGNOSIS — Z888 Allergy status to other drugs, medicaments and biological substances status: Secondary | ICD-10-CM

## 2016-10-05 DIAGNOSIS — R262 Difficulty in walking, not elsewhere classified: Secondary | ICD-10-CM | POA: Diagnosis not present

## 2016-10-05 DIAGNOSIS — Z91018 Allergy to other foods: Secondary | ICD-10-CM

## 2016-10-05 DIAGNOSIS — R102 Pelvic and perineal pain: Secondary | ICD-10-CM

## 2016-10-05 DIAGNOSIS — S3210XA Unspecified fracture of sacrum, initial encounter for closed fracture: Secondary | ICD-10-CM | POA: Diagnosis not present

## 2016-10-05 DIAGNOSIS — Y92009 Unspecified place in unspecified non-institutional (private) residence as the place of occurrence of the external cause: Secondary | ICD-10-CM | POA: Diagnosis not present

## 2016-10-05 DIAGNOSIS — W010XXA Fall on same level from slipping, tripping and stumbling without subsequent striking against object, initial encounter: Secondary | ICD-10-CM | POA: Diagnosis present

## 2016-10-05 DIAGNOSIS — I429 Cardiomyopathy, unspecified: Secondary | ICD-10-CM | POA: Diagnosis not present

## 2016-10-05 DIAGNOSIS — Z961 Presence of intraocular lens: Secondary | ICD-10-CM | POA: Diagnosis present

## 2016-10-05 DIAGNOSIS — M546 Pain in thoracic spine: Secondary | ICD-10-CM | POA: Diagnosis not present

## 2016-10-05 DIAGNOSIS — R5381 Other malaise: Secondary | ICD-10-CM | POA: Diagnosis not present

## 2016-10-05 DIAGNOSIS — S79912A Unspecified injury of left hip, initial encounter: Secondary | ICD-10-CM | POA: Diagnosis not present

## 2016-10-05 DIAGNOSIS — S41112A Laceration without foreign body of left upper arm, initial encounter: Secondary | ICD-10-CM | POA: Diagnosis present

## 2016-10-05 DIAGNOSIS — Z886 Allergy status to analgesic agent status: Secondary | ICD-10-CM

## 2016-10-05 DIAGNOSIS — R52 Pain, unspecified: Secondary | ICD-10-CM | POA: Diagnosis not present

## 2016-10-05 DIAGNOSIS — Z880 Allergy status to penicillin: Secondary | ICD-10-CM

## 2016-10-05 DIAGNOSIS — E861 Hypovolemia: Secondary | ICD-10-CM | POA: Diagnosis present

## 2016-10-05 DIAGNOSIS — Z8249 Family history of ischemic heart disease and other diseases of the circulatory system: Secondary | ICD-10-CM

## 2016-10-05 DIAGNOSIS — S79919A Unspecified injury of unspecified hip, initial encounter: Secondary | ICD-10-CM | POA: Diagnosis not present

## 2016-10-05 DIAGNOSIS — M25552 Pain in left hip: Secondary | ICD-10-CM | POA: Diagnosis not present

## 2016-10-05 DIAGNOSIS — I10 Essential (primary) hypertension: Secondary | ICD-10-CM | POA: Diagnosis not present

## 2016-10-05 DIAGNOSIS — N189 Chronic kidney disease, unspecified: Secondary | ICD-10-CM

## 2016-10-05 DIAGNOSIS — J449 Chronic obstructive pulmonary disease, unspecified: Secondary | ICD-10-CM | POA: Diagnosis not present

## 2016-10-05 DIAGNOSIS — Z9842 Cataract extraction status, left eye: Secondary | ICD-10-CM

## 2016-10-05 DIAGNOSIS — Z90710 Acquired absence of both cervix and uterus: Secondary | ICD-10-CM

## 2016-10-05 DIAGNOSIS — E871 Hypo-osmolality and hyponatremia: Secondary | ICD-10-CM | POA: Diagnosis present

## 2016-10-05 DIAGNOSIS — N289 Disorder of kidney and ureter, unspecified: Secondary | ICD-10-CM

## 2016-10-05 DIAGNOSIS — M25559 Pain in unspecified hip: Secondary | ICD-10-CM | POA: Diagnosis not present

## 2016-10-05 DIAGNOSIS — R1310 Dysphagia, unspecified: Secondary | ICD-10-CM | POA: Diagnosis not present

## 2016-10-05 DIAGNOSIS — E785 Hyperlipidemia, unspecified: Secondary | ICD-10-CM | POA: Diagnosis not present

## 2016-10-05 DIAGNOSIS — Z91010 Allergy to peanuts: Secondary | ICD-10-CM

## 2016-10-05 DIAGNOSIS — D649 Anemia, unspecified: Secondary | ICD-10-CM | POA: Diagnosis not present

## 2016-10-05 DIAGNOSIS — S3993XA Unspecified injury of pelvis, initial encounter: Secondary | ICD-10-CM | POA: Diagnosis not present

## 2016-10-05 DIAGNOSIS — Z882 Allergy status to sulfonamides status: Secondary | ICD-10-CM

## 2016-10-05 DIAGNOSIS — Z79899 Other long term (current) drug therapy: Secondary | ICD-10-CM

## 2016-10-05 DIAGNOSIS — M6281 Muscle weakness (generalized): Secondary | ICD-10-CM | POA: Diagnosis not present

## 2016-10-05 DIAGNOSIS — S3210XD Unspecified fracture of sacrum, subsequent encounter for fracture with routine healing: Secondary | ICD-10-CM | POA: Diagnosis not present

## 2016-10-05 DIAGNOSIS — Z8542 Personal history of malignant neoplasm of other parts of uterus: Secondary | ICD-10-CM | POA: Diagnosis not present

## 2016-10-05 DIAGNOSIS — J309 Allergic rhinitis, unspecified: Secondary | ICD-10-CM | POA: Diagnosis present

## 2016-10-05 DIAGNOSIS — E876 Hypokalemia: Secondary | ICD-10-CM | POA: Diagnosis not present

## 2016-10-05 DIAGNOSIS — Z9841 Cataract extraction status, right eye: Secondary | ICD-10-CM

## 2016-10-05 DIAGNOSIS — Z881 Allergy status to other antibiotic agents status: Secondary | ICD-10-CM

## 2016-10-05 DIAGNOSIS — D631 Anemia in chronic kidney disease: Secondary | ICD-10-CM | POA: Diagnosis not present

## 2016-10-05 DIAGNOSIS — J45998 Other asthma: Secondary | ICD-10-CM | POA: Diagnosis not present

## 2016-10-05 DIAGNOSIS — J42 Unspecified chronic bronchitis: Secondary | ICD-10-CM | POA: Diagnosis not present

## 2016-10-05 DIAGNOSIS — Z9049 Acquired absence of other specified parts of digestive tract: Secondary | ICD-10-CM

## 2016-10-05 DIAGNOSIS — S299XXA Unspecified injury of thorax, initial encounter: Secondary | ICD-10-CM | POA: Diagnosis not present

## 2016-10-05 LAB — BASIC METABOLIC PANEL
ANION GAP: 7 (ref 5–15)
Anion gap: 8 (ref 5–15)
BUN: 12 mg/dL (ref 6–20)
BUN: 11 mg/dL (ref 6–20)
CHLORIDE: 81 mmol/L — AB (ref 101–111)
CHLORIDE: 85 mmol/L — AB (ref 101–111)
CO2: 25 mmol/L (ref 22–32)
CO2: 23 mmol/L (ref 22–32)
Calcium: 8.2 mg/dL — ABNORMAL LOW (ref 8.9–10.3)
Calcium: 7.6 mg/dL — ABNORMAL LOW (ref 8.9–10.3)
Creatinine, Ser: 0.84 mg/dL (ref 0.44–1.00)
Creatinine, Ser: 0.69 mg/dL (ref 0.44–1.00)
GFR calc Af Amer: >60 mL/min (ref >60)
GFR calc Af Amer: >60 mL/min (ref >60)
GFR calc non Af Amer: 59 mL/min — ABNORMAL LOW (ref >60)
GFR calc non Af Amer: >60 mL/min (ref >60)
GLUCOSE: 102 mg/dL — AB (ref 65–99)
Glucose, Bld: 97 mg/dL (ref 65–99)
POTASSIUM: 3.2 mmol/L — AB (ref 3.5–5.1)
POTASSIUM: 3.1 mmol/L — AB (ref 3.5–5.1)
SODIUM: 115 mmol/L — AB (ref 135–145)
Sodium: 114 mmol/L — CL (ref 135–145)

## 2016-10-05 LAB — RETICULOCYTES
RBC.: 2.87 MIL/uL — ABNORMAL LOW (ref 3.87–5.11)
RETIC COUNT ABSOLUTE: 17.2 10*3/uL — AB (ref 19.0–186.0)
Retic Ct Pct: 0.6 % (ref 0.4–3.1)

## 2016-10-05 LAB — URINALYSIS, ROUTINE W REFLEX MICROSCOPIC
BILIRUBIN URINE: NEGATIVE
Glucose, UA: NEGATIVE mg/dL
Ketones, ur: NEGATIVE mg/dL
NITRITE: NEGATIVE
PROTEIN: 30 mg/dL — AB
Specific Gravity, Urine: 1.01 (ref 1.005–1.030)
pH: 7 (ref 5.0–8.0)

## 2016-10-05 LAB — CBC WITH DIFFERENTIAL/PLATELET
Basophils Absolute: 0 10*3/uL (ref 0.0–0.1)
Basophils Relative: 0 %
EOS ABS: 0.1 10*3/uL (ref 0.0–0.7)
EOS PCT: 1 %
HEMATOCRIT: 21.9 % — AB (ref 36.0–46.0)
HEMOGLOBIN: 7.7 g/dL — AB (ref 12.0–15.0)
LYMPHS PCT: 21 %
Lymphs Abs: 1.4 10*3/uL (ref 0.7–4.0)
MCH: 25.2 pg — AB (ref 26.0–34.0)
MCHC: 35.2 g/dL (ref 30.0–36.0)
MCV: 71.6 fL — ABNORMAL LOW (ref 78.0–100.0)
Monocytes Absolute: 0.4 10*3/uL (ref 0.1–1.0)
Monocytes Relative: 6 %
Neutro Abs: 5 10*3/uL (ref 1.7–7.7)
Neutrophils Relative %: 72 %
Platelets: 303 10*3/uL (ref 150–400)
RBC: 3.06 MIL/uL — AB (ref 3.87–5.11)
RDW: 15.1 % (ref 11.5–15.5)
WBC: 6.9 10*3/uL (ref 4.0–10.5)

## 2016-10-05 LAB — FOLATE: FOLATE: 32.4 ng/mL (ref >5.9)

## 2016-10-05 LAB — HEMOGLOBIN AND HEMATOCRIT, BLOOD
HCT: 18.8 % — ABNORMAL LOW (ref 36.0–46.0)
Hemoglobin: 6.6 g/dL — CL (ref 12.0–15.0)

## 2016-10-05 LAB — VITAMIN B12: Vitamin B-12: 3358 pg/mL — ABNORMAL HIGH (ref 180–914)

## 2016-10-05 LAB — IRON AND TIBC
IRON: 15 ug/dL — AB (ref 28–170)
Saturation Ratios: 5 % — ABNORMAL LOW (ref 10.4–31.8)
TIBC: 332 ug/dL (ref 250–450)
UIBC: 317 ug/dL

## 2016-10-05 LAB — FERRITIN: Ferritin: 70 ng/mL (ref 11–307)

## 2016-10-05 LAB — POC OCCULT BLOOD, ED: Fecal Occult Bld: NEGATIVE

## 2016-10-05 LAB — PREPARE RBC (CROSSMATCH)

## 2016-10-05 LAB — ABO/RH: ABO/RH(D): O NEG

## 2016-10-05 MED ORDER — TRAMADOL HCL 50 MG PO TABS: 50.0000 mg | ORAL_TABLET | Freq: Four times a day (QID) | ORAL | Status: DC | PRN

## 2016-10-05 MED ORDER — ACETAMINOPHEN 650 MG RE SUPP: 650.0000 mg | Freq: Four times a day (QID) | RECTAL | Status: DC | PRN

## 2016-10-05 MED ORDER — FENTANYL CITRATE (PF) 100 MCG/2ML IJ SOLN
50.0000 ug | Freq: Once | INTRAMUSCULAR | Status: AC
  Administered 2016-10-05: 50 ug via INTRAVENOUS
  Filled 2016-10-05: qty 2

## 2016-10-05 MED ORDER — SODIUM CHLORIDE 0.9 % IV SOLN
Freq: Once | INTRAVENOUS | Status: AC
  Administered 2016-10-05: 21:00:00 via INTRAVENOUS

## 2016-10-05 MED ORDER — ATORVASTATIN CALCIUM 10 MG PO TABS
10.0000 mg | ORAL_TABLET | ORAL | Status: DC
  Administered 2016-10-06 – 2016-10-08 (×2): 10 mg via ORAL
  Filled 2016-10-05 (×2): qty 1

## 2016-10-05 MED ORDER — DILTIAZEM HCL ER COATED BEADS 120 MG PO CP24
120.0000 mg | ORAL_CAPSULE | Freq: Every day | ORAL | Status: DC
  Administered 2016-10-06 – 2016-10-08 (×3): 120 mg via ORAL
  Filled 2016-10-05 (×6): qty 1

## 2016-10-05 MED ORDER — SODIUM CHLORIDE 0.9 % IV SOLN
INTRAVENOUS | Status: AC
  Administered 2016-10-05 – 2016-10-06 (×2): via INTRAVENOUS

## 2016-10-05 MED ORDER — POTASSIUM CHLORIDE CRYS ER 20 MEQ PO TBCR
20.0000 meq | EXTENDED_RELEASE_TABLET | Freq: Two times a day (BID) | ORAL | Status: AC
  Administered 2016-10-05 – 2016-10-06 (×2): 20 meq via ORAL
  Filled 2016-10-05 (×2): qty 1

## 2016-10-05 MED ORDER — HYDRALAZINE HCL 10 MG PO TABS
10.0000 mg | ORAL_TABLET | Freq: Four times a day (QID) | ORAL | Status: DC
  Administered 2016-10-05 – 2016-10-08 (×10): 10 mg via ORAL
  Filled 2016-10-05 (×13): qty 1

## 2016-10-05 MED ORDER — POLYVINYL ALCOHOL 1.4 % OP SOLN
1.0000 [drp] | Freq: Three times a day (TID) | OPHTHALMIC | Status: DC
  Administered 2016-10-05 – 2016-10-08 (×7): 1 [drp] via OPHTHALMIC
  Filled 2016-10-05: qty 15

## 2016-10-05 MED ORDER — POTASSIUM CHLORIDE 10 MEQ/100ML IV SOLN
10.0000 meq | Freq: Once | INTRAVENOUS | Status: AC
  Administered 2016-10-05: 10 meq via INTRAVENOUS
  Filled 2016-10-05: qty 100

## 2016-10-05 MED ORDER — MAGNESIUM SULFATE 2 GM/50ML IV SOLN
2.0000 g | Freq: Once | INTRAVENOUS | Status: AC
  Administered 2016-10-05: 2 g via INTRAVENOUS
  Filled 2016-10-05: qty 50

## 2016-10-05 MED ORDER — ONDANSETRON HCL 4 MG PO TABS: 4.0000 mg | ORAL_TABLET | Freq: Four times a day (QID) | ORAL | Status: DC | PRN

## 2016-10-05 MED ORDER — POLYETHYLENE GLYCOL 3350 17 G PO PACK: 17.0000 g | PACK | Freq: Every day | ORAL | Status: DC | PRN

## 2016-10-05 MED ORDER — MONTELUKAST SODIUM 10 MG PO TABS
10.0000 mg | ORAL_TABLET | Freq: Every day | ORAL | Status: DC
  Administered 2016-10-05 – 2016-10-07 (×3): 10 mg via ORAL
  Filled 2016-10-05 (×3): qty 1

## 2016-10-05 MED ORDER — ACETAMINOPHEN 325 MG PO TABS: 650.0000 mg | ORAL_TABLET | Freq: Four times a day (QID) | ORAL | Status: DC | PRN

## 2016-10-05 MED ORDER — PANTOPRAZOLE SODIUM 40 MG PO TBEC
40.0000 mg | DELAYED_RELEASE_TABLET | Freq: Every day | ORAL | Status: DC
  Administered 2016-10-06 – 2016-10-08 (×3): 40 mg via ORAL
  Filled 2016-10-05 (×4): qty 1

## 2016-10-05 MED ORDER — FENTANYL CITRATE (PF) 100 MCG/2ML IJ SOLN: 50.0000 ug | INTRAMUSCULAR | Status: DC | PRN

## 2016-10-05 MED ORDER — LOSARTAN POTASSIUM 50 MG PO TABS
150.0000 mg | ORAL_TABLET | Freq: Every day | ORAL | Status: DC
  Administered 2016-10-06 – 2016-10-08 (×3): 150 mg via ORAL
  Filled 2016-10-05 (×5): qty 3

## 2016-10-05 MED ORDER — ONDANSETRON HCL 4 MG/2ML IJ SOLN
4.0000 mg | Freq: Four times a day (QID) | INTRAMUSCULAR | Status: DC | PRN
  Administered 2016-10-05 – 2016-10-08 (×3): 4 mg via INTRAVENOUS
  Filled 2016-10-05 (×3): qty 2

## 2016-10-05 MED ORDER — MOMETASONE FURO-FORMOTEROL FUM 200-5 MCG/ACT IN AERO
2.0000 | INHALATION_SPRAY | Freq: Two times a day (BID) | RESPIRATORY_TRACT | Status: DC
  Administered 2016-10-05 – 2016-10-08 (×6): 2 via RESPIRATORY_TRACT
  Filled 2016-10-05: qty 8.8

## 2016-10-05 MED ORDER — ATORVASTATIN CALCIUM 10 MG PO TABS: 10.0000 mg | ORAL_TABLET | ORAL | Status: DC

## 2016-10-05 MED ORDER — ALBUTEROL SULFATE HFA 108 (90 BASE) MCG/ACT IN AERS: 2.0000 | INHALATION_SPRAY | Freq: Four times a day (QID) | RESPIRATORY_TRACT | Status: DC | PRN

## 2016-10-05 MED ORDER — ADULT MULTIVITAMIN W/MINERALS CH
1.0000 | ORAL_TABLET | Freq: Every day | ORAL | Status: DC
  Administered 2016-10-06 – 2016-10-08 (×3): 1 via ORAL
  Filled 2016-10-05 (×3): qty 1

## 2016-10-05 MED ORDER — ALBUTEROL SULFATE (2.5 MG/3ML) 0.083% IN NEBU: 2.5000 mg | INHALATION_SOLUTION | Freq: Four times a day (QID) | RESPIRATORY_TRACT | Status: DC | PRN

## 2016-10-05 MED ORDER — SODIUM CHLORIDE 0.9 % IV BOLUS (SEPSIS)
1000.0000 mL | Freq: Once | INTRAVENOUS | Status: AC
  Administered 2016-10-05: 1000 mL via INTRAVENOUS

## 2016-10-05 NOTE — ED Notes (Signed)
placexd

## 2016-10-05 NOTE — H&P (Signed)
History and Physical    Jasmin Lee ZYS:[id number] DOB: [birth year] DOA: 10/05/2016  PCP: [name], MD  Patient coming from: home  Chief Complaint: hip pain  HPI: Jasmin Lee is a 83 y.o. female with medical history significant of cardiomyopathy, hypertension, uterine cancer, anemia, chronic bronchitis/COPD, HTN and dyslipidemia presented to the ED via EMS for evaluation of left hip pain Patient states she was walking from a tiled floor to a carpeted floor, tripped over tand fell landing on her left side EMS was called out initially but patient refused transport. However called back later and was delivered to the ED for evaluation. R She reports that papin is better at rest and is eally increasing with movements. She denied any other symptoms, such as HA or lightheadedness, fainting, dizziness.  ED Course: On arrival she had stable vital signs. Blood work demonstrated to hemoglobin 7.7 hematocrit 21.9%, sodium 114 and potassium level 3.2 Left hip x-ray didn't demonstrate any acute osseous injury Thoracic spine x-ray showed multilevel degenerative disc disease and no acute abnormality  Review of Systems: As per HPI otherwise all other systems reviewed and  are negative  Ambulatory Status:  Ambulates with cane at baseline  Past Medical History:  Diagnosis Date  . Anemia   . Arthritis    "in my legs and feet" (05/11/2014)  . Cardiomyopathy ([place])   . Chronic bronchitis ([place]) "2-3 times/yr"  . Complication of anesthesia    "started flinching at start of hysterectomy; they had to give me more anesthetic"  . COPD with asthma ([place])   . Dyslipidemia   . Echocardiogram abnormal 12/05/2010   Moderate concentric left ventricular hypertrophy EF > 93% stage I diastolic dysfunction, left atrium mild to moderate dilatation, moderate mitral annular calcification with trace MR. Trace TR.  Marland Kitchen HTN (hypertension)   . Pneumonia "several times"  . Squamous carcinoma    "left face and  thigh"  . Uterine cancer [facility])     Past Surgical History:  Procedure Laterality Date  . APPENDECTOMY  1954  . BREAST BIOPSY Left    "it was nothing"  . CARDIAC EVENT MONITOR  12/05/10-01/02/11   SINUS RHYTHM  . CATARACT EXTRACTION W/ INTRAOCULAR LENS  IMPLANT, BILATERAL Bilateral   . CHOLECYSTECTOMY  2003  . DILATION AND CURETTAGE OF UTERUS  1966  . SQUAMOUS CELL CARCINOMA EXCISION Left    face and thigh  . TRANSTHORACIC ECHOCARDIOGRAM  December 05, 2010   moderate cocentric LVH WITH STAGE 1 IMPARIED DIASTOLIC DYSFUNCTION , NORMAL EF AND NORMAL LV PRESSURES. LEFT ATRIUM WAS ONLY MILD TO MODERATELY DILATED .MOD MITRAL CALCIFICATION WITH ONLY MILD REGURGITATION.,OTHERWISE RELATIVELY NORMAL  . VAGINAL HYSTERECTOMY  1966    Social History   Social History  . Marital status: Married    Spouse name: N/A  . Number of children: N/A  . Years of education: N/A   Occupational History  . Not on file.   Social History Main Topics  . Smoking status: Never Smoker  . Smokeless tobacco: Never Used  . Alcohol use No  . Drug use: No  . Sexual activity: No   Other Topics Concern  . Not on file   Social History Narrative  . No narrative on file    Allergies  Allergen Reactions  . Almond (Diagnostic) Shortness Of Breath  . Asa [Aspirin] Shortness Of Breath    On high doses of ASA  . Coconut Flavor Shortness Of Breath  . Neomycin Shortness Of Breath  . Peanut-Containing  Drug Products Shortness Of Breath  . Penicillins Shortness Of Breath    Has patient had a PCN reaction causing immediate rash, facial/tongue/throat swelling, SOB or lightheadedness with hypotension: Yes Has patient had a PCN reaction causing severe rash involving mucus membranes or skin necrosis: No Has patient had a PCN reaction that required hospitalization No Has patient had a PCN reaction occurring within the last 10 years: No If all of the above answers are "NO", then may proceed with Cephalosporin use.   .  Sulfa Antibiotics Shortness Of Breath  . Tizanidine Shortness Of Breath  . Lisinopril Cough  . Keflex [Cephalexin] Rash    Family History  Problem Relation Age of Onset  . Heart attack Mother   . Cancer Father   . Heart attack Maternal Grandmother   . Heart disease Son   . Asthma Daughter     Prior to Admission medications   Medication Sig Start Date End Date Taking? Authorizing Provider  acetaminophen (TYLENOL) 500 MG tablet Take 1,000 mg by mouth every 6 (six) hours as needed for mild pain, moderate pain, fever or headache.     [provider]  ADVAIR DISKUS 250-50 MCG/DOSE AEPB Inhale 1 puff into the lungs 2 (two) times daily.  01/26/13   [provider]  albuterol (ACCUNEB) 0.63 MG/3ML nebulizer solution Take 1 ampule by nebulization every 3 (three) days.     [provider]  Artificial Tear Ointment (DRY EYES OP) Place 1 drop into both eyes 3 (three) times daily.     [provider]  atorvastatin (LIPITOR) 10 MG tablet Take 10 mg by mouth every other day.  01/30/13   [provider]  Azelastine HCl 0.15 % SOLN Place 2 sprays into the nose daily. 12/03/12   [provider]  Cyanocobalamin (VITAMIN B-12 PO) Take 1 tablet by mouth daily with breakfast.     [provider]  DILT-XR 120 MG 24 hr capsule TAKE 1 CAPSULE DAILY 09/01/16   [name], MD  hydrALAZINE (APRESOLINE) 10 MG tablet TAKE 5 TABLETS DAILY (2 TABLETS IN THE MORNING, 1 TABLET IN THE AFTERNOON AND 2 TABLETS IN THE EVENING) Patient taking differently: Takes 2 TABLETS IN THE MORNING, 1 TABLET IN THE AFTERNOON AND 2 TABLETS IN THE EVENING 10/30/15   [name], MD  hydrochlorothiazide (HYDRODIURIL) 25 MG tablet Take 25 mg by mouth daily.    [provider]  losartan (COZAAR) 50 MG tablet TAKE 2 TABLETS IN THE MORNING AND 1 TABLET IN THE EVENING 09/07/16   [name], MD  montelukast (SINGULAIR) 10 MG tablet Take 10 mg by mouth at bedtime.   12/03/12   [provider]  Multiple Vitamin (MULTIVITAMIN WITH MINERALS) TABS tablet Take 1 tablet by mouth daily with breakfast.     [provider]  pantoprazole (PROTONIX) 40 MG tablet Take 1 tablet (40 mg total) by mouth daily. Patient not taking: Reported on 06/23/2016 06/13/13   [name], MD  VENTOLIN HFA 108 (90 BASE) MCG/ACT inhaler Inhale 2 puffs into the lungs every 4 (four) hours as needed for wheezing or shortness of breath.  01/26/13   [provider]  vitamin C (ASCORBIC ACID) 250 MG tablet Take 250 mg by mouth daily.    [provider]    Physical Exam: Vitals:   10/05/16 1000 10/05/16 1038 10/05/16 1045 10/05/16 1447  BP: (!) 143/53 (!) 146/75  (!) 150/56  Pulse: (!) 57 65 65 73  Resp:  20  Temp:      TempSrc:      SpO2: 92% 98% 100% 100%     General: Appears calm and comfortable Eyes: PERRLA, EOMI, normal lids, iris ENT:  grossly normal hearing, lips & tongue, mucous membranes moist and intact Neck: no lymphoadenopathy, masses or thyromegaly Cardiovascular: RRR, no m/r/g. No JVD, carotid bruits. No LE edema.  Respiratory: bilateral no wheezes, rales, rhonchi or cracles. Normal respiratory effort. No accessory muscle use observed Abdomen: soft, non-tender, non-distended, no organomegaly or masses appreciated. BS present in all quadrants Skin: no rash, ulcers or induration seen on limited exam Musculoskeletal: grossly normal tone BUE/BLE with good ROM all extremities except left leg that has limited ROM on flexion and internal rotation of the hip, no bony abnormality or joint deformities observed Psychiatric: grossly normal mood and affect, speech fluent and appropriate, alert and oriented x3 Neurologic: CN II-XII grossly intact, moves all extremities in coordinated fashion, sensation intact  Labs on Admission: I have personally reviewed following labs and imaging studies  CBC, BMP  GFR: CrCl cannot be calculated (Unknown  ideal weight.).   Creatinine Clearance: CrCl cannot be calculated (Unknown ideal weight.).    Radiological Exams on Admission: Dg Thoracic Spine 2 View  Result Date: 10/05/2016 CLINICAL DATA:  Thoracic spine pain after fall today. EXAM: THORACIC SPINE 2 VIEWS COMPARISON:  CT scan of June 11, 2013. FINDINGS: No fracture or spondylolisthesis is noted. Degenerative disc disease is noted in the mid and lower thoracic spine. Atherosclerosis of thoracic aorta is noted. IMPRESSION: Multilevel degenerative disc disease. No acute abnormality seen in the thoracic spine. Aortic atherosclerosis. Electronically Signed   By: Marijo Conception, M.D.   On: 10/05/2016 10:34   Dg Hip Unilat W Or Wo Pelvis 2-3 Views Left  Result Date: 10/05/2016 CLINICAL DATA:  Status post fall EXAM: DG HIP (WITH OR WITHOUT PELVIS) 2-3V LEFT COMPARISON:  None. FINDINGS: There is no evidence of hip fracture or dislocation. There is no evidence of arthropathy or other focal bone abnormality. Lumbar spine spondylosis. IMPRESSION: No acute osseous injury of the left hip. Electronically Signed   By: Kathreen Devoid   On: 10/05/2016 10:57    EKG: Not found  Assessment/Plan Principal Problem:   Left hip pain Active Problems:   Essential hypertension   Dyslipidemia   Hyponatremia   Hypokalemia   Anemia    Left hip pain An attempt was made to ambulate patient, but she was unable to bear weight on the left leg  MRI of the hip and pelvis is pending to r/o ligamentous injury Provide pain control  Anemia - last Hgb available in the EPIC is 9.8 in 2015, today's Hgb is 7.7 g/dL Iron panel is pending, will follow [facility] Continue to monitor and transfuse if needed  Electrolyte imbalance with hyponatremia and hypokalemia - could be associated with diuretic therapy, which will be on hold now Continue IV NS and replace potassium and recheck in the morning  HTN Continue home meds and monitor BP readings Adjust meds if  needed  Dyslipidemia Continue Lipitor  Asthma/COPD  Stable at present Continue home inhaler, Nebs, prn, Advair discus and Singulair   DVT prophylaxis: SCD Code Status: Full Family Communication: none Disposition Plan: MedSurg Consults called: none Admission status: Inpatient   York Grice, [place] Pager: [phone] [facility]  If 7PM-7AM, please contact night-coverage [email or web address] Password [facility]  10/05/2016, 4:06 PM

## 2016-10-05 NOTE — Progress Notes (Signed)
CRITICAL VALUE ALERT  Critical value received:  Hemoglobin 6.6  Date of notification:  10/05/16  Time of notification:  [place]  Critical value read back:yes  Nurse who received alert:  Lonn Georgia  MD notified (1st page):  Dr. Marily Memos  Time of first page: 1843  MD notified (2nd page):  Time of second page:  Responding MD: Dr. Gerald Dexter  Time MD responded:  18:50

## 2016-10-05 NOTE — ED Notes (Signed)
Patient has the skin tear noted on left elbow.

## 2016-10-05 NOTE — Progress Notes (Signed)
CRITICAL VALUE ALERT  Critical value received:  Na 115  Date of notification:  10/05/16  Time of notification: 1855  Critical value read back: yes  Nurse who received alert:  Lonn Georgia  MD notified (1st page): Dr. Gerald Dexter  Time of first page:  1855  Responding MD:  Dr. Gerald Dexter  Time MD responded:  [phone]

## 2016-10-05 NOTE — ED Notes (Signed)
Placed on the monitor patient

## 2016-10-05 NOTE — ED Notes (Signed)
[phone] call Jasmin Lee

## 2016-10-05 NOTE — ED Triage Notes (Signed)
Patient presents today with complaints of left hip pain. Patient reports she was walking last night and trip and fell. Patient reports EMS came out and evaluated her and refused to come to hospital. Patient reports pain has become increasing worst. Patient reports she has been ambulating since fall. Sensation in tack, patient able to move toes. Pedal pulse present. Patient alert and oriented.

## 2016-10-05 NOTE — ED Notes (Signed)
Patient/family updated on delay on MRI.   Admitting physician at bedside.

## 2016-10-05 NOTE — ED Provider Notes (Signed)
[facility] DEPT Provider Note   CSN: [id number] Arrival date & time: 10/05/16  2426     History   Chief Complaint Chief Complaint  Patient presents with  . Hip Pain    HPI Jasmin Lee is a 83 y.o. female.   Hip Pain    83 year old female with a history of cardio myopathy, hypertension, bronchitis, uterine cancer present C [place] her today after a mechanical fall. Patient states she was walking from a tiled floor to a carpeted floor and there was a lip and she tripped over that and fell landing on her left side with her resultant left hip pain. She also had a skin tear to her left arm. Sounds like EMS was called out initially but then patient refused transport and then called back later came in for evaluation. Right now she is complaining of left hip pain is made better by laying still worse with movement. No history of broken bones that she knows of. No loss of consciousness or head trauma. No new back abdominal or chest pain at rest.  Past Medical History:  Diagnosis Date  . Anemia   . Arthritis    "in my legs and feet" (05/11/2014)  . Cardiomyopathy ([place])   . Chronic bronchitis ([place]) "2-3 times/yr"  . Complication of anesthesia    "started flinching at start of hysterectomy; they had to give me more anesthetic"  . COPD with asthma ([place])   . Dyslipidemia   . Echocardiogram abnormal 12/05/2010   Moderate concentric left ventricular hypertrophy EF > 83% stage I diastolic dysfunction, left atrium mild to moderate dilatation, moderate mitral annular calcification with trace MR. Trace TR.  Marland Kitchen HTN (hypertension)   . Pneumonia "several times"  . Squamous carcinoma    "left face and thigh"  . Uterine cancer [facility])     Patient Active Problem List   Diagnosis Date Noted  . Abnormal EKG 11/15/2014  . Ankle edema 11/15/2014  . Extrinsic asthma 05/12/2014  . Chest pressure 05/11/2014  . Chest pain 05/11/2014  . Weakness 06/21/2013  . Hyponatremia 06/11/2013  . COPD  (chronic obstructive pulmonary disease) ([place]) 06/11/2013  . Acute renal failure ([place]) 06/11/2013  . Hypokalemia 06/11/2013  . Anemia 06/11/2013  . Essential hypertension   . Dyslipidemia     Past Surgical History:  Procedure Laterality Date  . APPENDECTOMY  1954  . BREAST BIOPSY Left    "it was nothing"  . CARDIAC EVENT MONITOR  12/05/10-01/02/11   SINUS RHYTHM  . CATARACT EXTRACTION W/ INTRAOCULAR LENS  IMPLANT, BILATERAL Bilateral   . CHOLECYSTECTOMY  2003  . DILATION AND CURETTAGE OF UTERUS  1966  . SQUAMOUS CELL CARCINOMA EXCISION Left    face and thigh  . TRANSTHORACIC ECHOCARDIOGRAM  December 05, 2010   moderate cocentric LVH WITH STAGE 1 IMPARIED DIASTOLIC DYSFUNCTION , NORMAL EF AND NORMAL LV PRESSURES. LEFT ATRIUM WAS ONLY MILD TO MODERATELY DILATED .MOD MITRAL CALCIFICATION WITH ONLY MILD REGURGITATION.,OTHERWISE RELATIVELY NORMAL  . VAGINAL HYSTERECTOMY  1966    OB History    No data available       Home Medications    Prior to Admission medications   Medication Sig Start Date End Date Taking? Authorizing Provider  acetaminophen (TYLENOL) 500 MG tablet Take 1,000 mg by mouth every 6 (six) hours as needed for mild pain, moderate pain, fever or headache.     [provider]  ADVAIR DISKUS 250-50 MCG/DOSE AEPB Inhale 1 puff into the lungs 2 (two) times  daily.  01/26/13   [provider]  albuterol (ACCUNEB) 0.63 MG/3ML nebulizer solution Take 1 ampule by nebulization every 3 (three) days.     [provider]  Artificial Tear Ointment (DRY EYES OP) Place 1 drop into both eyes 3 (three) times daily.     [provider]  atorvastatin (LIPITOR) 10 MG tablet Take 10 mg by mouth every other day.  01/30/13   [provider]  Azelastine HCl 0.15 % SOLN Place 2 sprays into the nose daily. 12/03/12   [provider]  Cyanocobalamin (VITAMIN B-12 PO) Take 1 tablet by mouth daily with breakfast.     [provider]  DILT-XR  120 MG 24 hr capsule TAKE 1 CAPSULE DAILY 09/01/16   [name], MD  hydrALAZINE (APRESOLINE) 10 MG tablet TAKE 5 TABLETS DAILY (2 TABLETS IN THE MORNING, 1 TABLET IN THE AFTERNOON AND 2 TABLETS IN THE EVENING) Patient taking differently: Takes 2 TABLETS IN THE MORNING, 1 TABLET IN THE AFTERNOON AND 2 TABLETS IN THE EVENING 10/30/15   [name], MD  hydrochlorothiazide (HYDRODIURIL) 25 MG tablet Take 25 mg by mouth daily.    [provider]  losartan (COZAAR) 50 MG tablet TAKE 2 TABLETS IN THE MORNING AND 1 TABLET IN THE EVENING 09/07/16   [name], MD  montelukast (SINGULAIR) 10 MG tablet Take 10 mg by mouth at bedtime.  12/03/12   [provider]  Multiple Vitamin (MULTIVITAMIN WITH MINERALS) TABS tablet Take 1 tablet by mouth daily with breakfast.     [provider]  pantoprazole (PROTONIX) 40 MG tablet Take 1 tablet (40 mg total) by mouth daily. Patient not taking: Reported on 06/23/2016 06/13/13   [name], MD  VENTOLIN HFA 108 (90 BASE) MCG/ACT inhaler Inhale 2 puffs into the lungs every 4 (four) hours as needed for wheezing or shortness of breath.  01/26/13   [provider]  vitamin C (ASCORBIC ACID) 250 MG tablet Take 250 mg by mouth daily.    [provider]    Family History Family History  Problem Relation Age of Onset  . Heart attack Mother   . Cancer Father   . Heart attack Maternal Grandmother   . Heart disease Son   . Asthma Daughter     Social History Social History  Substance Use Topics  . Smoking status: Never Smoker  . Smokeless tobacco: Never Used  . Alcohol use No     Allergies   Almond (diagnostic); Asa [aspirin]; Coconut flavor; Neomycin; Peanut-containing drug products; Penicillins; Sulfa antibiotics; Tizanidine; Lisinopril; and Keflex [cephalexin]   Review of Systems Review of Systems  All other systems reviewed and are negative.    Physical Exam Updated Vital Signs BP (!) 135/54 (BP  Location: Right Arm)   Pulse (!) 58   Temp 97.8 F (36.6 C) (Oral)   Resp 20   SpO2 100%   Physical Exam  Constitutional: She appears well-developed and well-nourished.  HENT:  Head: Normocephalic and atraumatic.  Eyes: Conjunctivae and EOM are normal.  Neck: Normal range of motion.  Cardiovascular: Normal rate and regular rhythm.   Pulmonary/Chest: No stridor. No respiratory distress.  Abdominal: Soft. She exhibits no distension.  Musculoskeletal: Normal range of motion. She exhibits tenderness (over left proximal femur, hip and left posterior pelvis).  No cervical spine tenderness, + thoracic spine tenderness, no Lumbar spine tenderness.  No tenderness or pain with palpation and full ROM of all joints in upper  and lower extremities aside from left hip.  No ecchymosis or other signs of trauma on back or extremities.  No Pain with AP or lateral compression of ribs.  No Paracervical ttp, paraspinal ttp   Neurological: She is alert. No cranial nerve deficit. Coordination normal.  No altered mental status, able to give full seemingly accurate history.  Face is symmetric, EOM's intact, pupils equal and reactive, vision intact, tongue and uvula midline without deviation Upper and Lower extremity motor 5/5, intact pain perception in distal extremities, 2+ reflexes in biceps, patella and achilles tendons.   Skin: Skin is warm and dry.  Nursing note and vitals reviewed.    ED Treatments / Results  Labs (all labs ordered are listed, but only abnormal results are displayed) Labs Reviewed - No data to display  EKG  EKG Interpretation None       Radiology Dg Thoracic Spine 2 View  Result Date: 10/05/2016 CLINICAL DATA:  Thoracic spine pain after fall today. EXAM: THORACIC SPINE 2 VIEWS COMPARISON:  CT scan of June 11, 2013. FINDINGS: No fracture or spondylolisthesis is noted. Degenerative disc disease is noted in the mid and lower thoracic spine. Atherosclerosis of thoracic  aorta is noted. IMPRESSION: Multilevel degenerative disc disease. No acute abnormality seen in the thoracic spine. Aortic atherosclerosis. Electronically Signed   By: Marijo Conception, M.D.   On: 10/05/2016 10:34   Mr Pelvis Wo Contrast  Result Date: 10/05/2016 CLINICAL DATA:  Tripped at home landing on left side. Left pelvic area tender to touch. EXAM: MRI PELVIS WITHOUT CONTRAST TECHNIQUE: Multiplanar multisequence MR imaging of the pelvis was performed. No intravenous contrast was administered. COMPARISON:  Pelvis and left hip radiographs from 10/05/2016 FINDINGS: Urinary Tract:  No abnormality visualized. Bowel:  Unremarkable visualized pelvic bowel loops. Vascular/Lymphatic: No pathologically enlarged lymph nodes. No significant vascular abnormality seen. Reproductive:  No mass or other significant abnormality Other: Soft tissue edema overlying the posterior and lateral aspect of both hips and thighs is identified left greater than right consistent with soft tissue contusions. Musculoskeletal: The visualized lumbar spine from L3 through S1 demonstrates disc space narrowing with disc desiccation. There is grade 1 anterolisthesis of L5 on S1. Small disc-osteophyte complexes are noted of the visualized lumbar spine. Small focus of marrow edema with transverse linear hypointensity consistent with a nondisplaced fracture of the S4 segment of the sacrum is noted, series 9, image 30, series 5, image 3, and series 3, image 3. No additional pelvic fracture is identified. Joint space narrowing is noted of both hips with small physiologic amount of joint fluid present bilaterally. No fracture of the proximal femora. No intramuscular hemorrhage nor atrophy. IMPRESSION: 1. Acute nondisplaced fracture of the S4 segment of the sacrum. 2. Soft tissue contusions along the posterior and lateral aspect of the thighs and hips left-greater-than-right. 3. No acute fracture of either hip nor dislocation. Mild joint space narrowing  of the hips consistent with osteoarthritis. 4. Lumbar spondylosis with grade 1 spondylolisthesis of L5 on S1. Electronically Signed   By: Ashley Royalty M.D.   On: 10/05/2016 17:49   Dg Hip Unilat W Or Wo Pelvis 2-3 Views Left  Result Date: 10/05/2016 CLINICAL DATA:  Status post fall EXAM: DG HIP (WITH OR WITHOUT PELVIS) 2-3V LEFT COMPARISON:  None. FINDINGS: There is no evidence of hip fracture or dislocation. There is no evidence of arthropathy or other focal bone abnormality. Lumbar spine spondylosis. IMPRESSION: No acute osseous injury of the left hip. Electronically Signed  By: Kathreen Devoid   On: 10/05/2016 10:57    Procedures Procedures (including critical care time)  Medications Ordered in ED Medications  fentaNYL (SUBLIMAZE) injection 50 mcg (not administered)  acetaminophen (TYLENOL) tablet 650 mg (not administered)    Or  acetaminophen (TYLENOL) suppository 650 mg (not administered)  traMADol (ULTRAM) tablet 50 mg (not administered)  polyethylene glycol (MIRALAX / GLYCOLAX) packet 17 g (not administered)  ondansetron (ZOFRAN) tablet 4 mg ( Oral See Alternative 10/05/16 1633)    Or  ondansetron (ZOFRAN) injection 4 mg (4 mg Intravenous Given 10/05/16 1633)  albuterol (PROVENTIL) (2.5 MG/3ML) 0.083% nebulizer solution 2.5 mg (not administered)  mometasone-formoterol (DULERA) 200-5 MCG/ACT inhaler 2 puff (2 puffs Inhalation Given 10/06/16 0808)  polyvinyl alcohol (LIQUIFILM TEARS) 1.4 % ophthalmic solution 1 drop (1 drop Both Eyes Given 10/06/16 1000)  diltiazem (CARDIZEM CD) 24 hr capsule 120 mg (120 mg Oral Given 10/06/16 1059)  hydrALAZINE (APRESOLINE) tablet 10 mg (10 mg Oral Given 10/06/16 1445)  losartan (COZAAR) tablet 150 mg (150 mg Oral Given 10/06/16 1059)  multivitamin with minerals tablet 1 tablet (1 tablet Oral Given 10/06/16 1059)  pantoprazole (PROTONIX) EC tablet 40 mg (40 mg Oral Given 10/06/16 1100)  montelukast (SINGULAIR) tablet 10 mg (10 mg Oral Given 10/05/16 2125)  0.9  %  sodium chloride infusion ( Intravenous Rate/Dose Change 10/06/16 1106)  atorvastatin (LIPITOR) tablet 10 mg (10 mg Oral Given 10/06/16 1059)  fentaNYL (SUBLIMAZE) injection 50 mcg (50 mcg Intravenous Given 10/05/16 1555)  sodium chloride 0.9 % bolus 1,000 mL (1,000 mLs Intravenous New Bag/Given 10/05/16 1323)  potassium chloride 10 mEq in 100 mL IVPB (0 mEq Intravenous Stopped 10/05/16 1636)  magnesium sulfate IVPB 2 g 50 mL (0 g Intravenous Stopped 10/05/16 1446)  potassium chloride SA (K-DUR,KLOR-CON) CR tablet 20 mEq (20 mEq Oral Given 10/06/16 1100)  0.9 %  sodium chloride infusion ( Intravenous New Bag/Given 10/05/16 2125)     Initial Impression / Assessment and Plan / ED Course  I have reviewed the triage vital signs and the nursing notes.  Pertinent labs & imaging results that were available during my care of the patient were reviewed by me and considered in my medical decision making (see chart for details).     Will eval for l hip/pelvis fracture  xr's negative but still has too much pain to stand so needs an MRI but her sodium is lower than normal, creatinine increased, hemoglobin low, K low all of which could lead to her weakness. Pending MRI. Given K, bolus and type/screen performed with addition of an anemia panel. Plan for observation admission for MRI and electrolyte correction.   Final Clinical Impressions(s) / ED Diagnoses   Final diagnoses:  Pain in pelvis  Acute pain of left hip  Hyponatremia  Anemia, unspecified type  Hypokalemia  Renal insufficiency     Absalom Aro, Corene Cornea, MD 10/06/16 1452

## 2016-10-05 NOTE — ED Notes (Signed)
Pt unable to stand without assistance due to pain.

## 2016-10-05 NOTE — ED Notes (Signed)
Attempted report x1. 

## 2016-10-05 NOTE — ED Notes (Signed)
Patient saturations dropped after fentanyl administration.  Placed on 2L by Dr. Marily Memos.  Patient transported to MRI on oxygen.

## 2016-10-06 ENCOUNTER — Encounter (HOSPITAL_COMMUNITY): Payer: Self-pay | Admitting: Surgery

## 2016-10-06 LAB — CBC
HEMATOCRIT: 30.1 % — AB (ref 36.0–46.0)
HEMOGLOBIN: 10.2 g/dL — AB (ref 12.0–15.0)
MCH: 25.1 pg — ABNORMAL LOW (ref 26.0–34.0)
MCHC: 33.9 g/dL (ref 30.0–36.0)
MCV: 74 fL — AB (ref 78.0–100.0)
Platelets: 284 10*3/uL (ref 150–400)
RBC: 4.07 MIL/uL (ref 3.87–5.11)
RDW: 14.7 % (ref 11.5–15.5)
WBC: 7.7 10*3/uL (ref 4.0–10.5)

## 2016-10-06 LAB — TYPE AND SCREEN
ABO/RH(D): O NEG
ANTIBODY SCREEN: NEGATIVE
UNIT DIVISION: 0
Unit division: 0

## 2016-10-06 LAB — BPAM RBC
BLOOD PRODUCT EXPIRATION DATE: 201805212359
Blood Product Expiration Date: 201805212359
ISSUE DATE / TIME: 201805142035
ISSUE DATE / TIME: 201805142356
UNIT TYPE AND RH: 9500
Unit Type and Rh: 9500

## 2016-10-06 LAB — BASIC METABOLIC PANEL
ANION GAP: 9 (ref 5–15)
BUN: 11 mg/dL (ref 6–20)
CHLORIDE: 90 mmol/L — AB (ref 101–111)
CO2: 21 mmol/L — ABNORMAL LOW (ref 22–32)
Calcium: 8 mg/dL — ABNORMAL LOW (ref 8.9–10.3)
Creatinine, Ser: 0.68 mg/dL (ref 0.44–1.00)
GFR calc non Af Amer: >60 mL/min (ref >60)
Glucose, Bld: 73 mg/dL (ref 65–99)
POTASSIUM: 3.9 mmol/L (ref 3.5–5.1)
Sodium: 120 mmol/L — ABNORMAL LOW (ref 135–145)

## 2016-10-06 LAB — MAGNESIUM: Magnesium: 1.8 mg/dL (ref 1.7–2.4)

## 2016-10-06 MED ORDER — ACETAMINOPHEN 325 MG PO TABS
650.0000 mg | ORAL_TABLET | Freq: Four times a day (QID) | ORAL | Status: DC
  Administered 2016-10-06 – 2016-10-08 (×7): 650 mg via ORAL
  Filled 2016-10-06 (×8): qty 2

## 2016-10-06 NOTE — NC FL2 (Signed)
[place] LEVEL OF CARE SCREENING TOOL     IDENTIFICATION  Patient Name: Jasmin Lee Birthdate: Oct 17, 1922 Sex: female Admission Date (Current Location): 10/05/2016  [facility] and [place] Number:  Herbalist and Address:  [facility], [place] [address] [place], [place] [address]      Provider Number: [id number]  Attending Physician Name and Address:  Murlean Iba, MD  Relative Name and Phone Number:  Duanne Moron [phone]    Current Level of Care: Hospital Recommended Level of Care: [place] Prior Approval Number:    Date Approved/Denied:   PASRR Number: [id number]  Discharge Plan: SNF    Current Diagnoses: Patient Active Problem List   Diagnosis Date Noted  . Left hip pain 10/05/2016  . Anemia due to chronic kidney disease   . Abnormal EKG 11/15/2014  . Ankle edema 11/15/2014  . Extrinsic asthma 05/12/2014  . Chest pressure 05/11/2014  . Chest pain 05/11/2014  . Weakness 06/21/2013  . Hyponatremia 06/11/2013  . COPD (chronic obstructive pulmonary disease) ([place]) 06/11/2013  . Acute renal failure ([place]) 06/11/2013  . Hypokalemia 06/11/2013  . Anemia 06/11/2013  . Essential hypertension   . Dyslipidemia     Orientation RESPIRATION BLADDER Height & Weight     Self, Time, Situation, Place  Normal Continent Weight:   Height:     BEHAVIORAL SYMPTOMS/MOOD NEUROLOGICAL BOWEL NUTRITION STATUS      Continent Diet (Please see DC Summary)  AMBULATORY STATUS COMMUNICATION OF NEEDS Skin   Extensive Assist Verbally Normal                       Personal Care Assistance Level of Assistance  Bathing, Feeding, Dressing Bathing Assistance: Maximum assistance Feeding assistance: Independent Dressing Assistance: Limited assistance     Functional Limitations Info             SPECIAL CARE FACTORS FREQUENCY  PT (By licensed PT)     PT Frequency: 5x/week               Contractures      Additional Factors Info  Code Status, Allergies Code Status Info: Full Allergies Info: Almond (Diagnostic), Asa Aspirin, Coconut Flavor, Neomycin, Peanut-containing Drug Products, Penicillins, Sulfa Antibiotics, Tizanidine, Lisinopril, Keflex Cephalexin           Current Medications (10/06/2016):  This is the current hospital active medication list Current Facility-Administered Medications  Medication Dose Route Frequency Provider Last Rate Last Dose  . 0.9 %  sodium chloride infusion   Intravenous Continuous Johnson, Clanford L, MD 50 mL/hr at 10/06/16 1106    . acetaminophen (TYLENOL) tablet 650 mg  650 mg Oral Q6H Johnson, Clanford L, MD      . albuterol (PROVENTIL) (2.5 MG/3ML) 0.083% nebulizer solution 2.5 mg  2.5 mg Nebulization Q6H PRN Kyazimova, Marina S, PA-C      . atorvastatin (LIPITOR) tablet 10 mg  10 mg Oral Donia Guiles, MD   10 mg at 10/06/16 1059  . diltiazem (CARDIZEM CD) 24 hr capsule 120 mg  120 mg Oral Daily York Grice S, PA-C   120 mg at 10/06/16 1059  . fentaNYL (SUBLIMAZE) injection 50 mcg  50 mcg Intravenous Q2H PRN York Grice S, PA-C      . hydrALAZINE (APRESOLINE) tablet 10 mg  10 mg Oral Q6H Kyazimova, Marina S, PA-C   10 mg at 10/06/16 1445  . losartan (COZAAR) tablet 150 mg  150 mg Oral Daily [name], PA-C   150 mg at 10/06/16 1059  . mometasone-formoterol (DULERA) 200-5 MCG/ACT inhaler 2 puff  2 puff Inhalation BID [name], PA-C   2 puff at 10/06/16 3612  . montelukast (SINGULAIR) tablet 10 mg  10 mg Oral QHS [name], PA-C   10 mg at 10/05/16 2125  . multivitamin with minerals tablet 1 tablet  1 tablet Oral Q breakfast [name], [place]   1 tablet at 10/06/16 1059  . ondansetron (ZOFRAN) tablet 4 mg  4 mg Oral Q6H PRN York Grice S, PA-C       Or  . ondansetron [facility]) injection 4 mg  4 mg Intravenous Q6H PRN [name], PA-C   4 mg at 10/05/16 1633  .  pantoprazole (PROTONIX) EC tablet 40 mg  40 mg Oral Daily York Grice S, PA-C   40 mg at 10/06/16 1100  . polyethylene glycol (MIRALAX / GLYCOLAX) packet 17 g  17 g Oral Daily PRN York Grice S, PA-C      . polyvinyl alcohol (LIQUIFILM TEARS) 1.4 % ophthalmic solution 1 drop  1 drop Both Eyes TID Kyazimova, Marina S, PA-C   1 drop at 10/06/16 1000  . traMADol (ULTRAM) tablet 50 mg  50 mg Oral Q6H PRN [name], PA-C         Discharge Medications: Please see discharge summary for a list of discharge medications.  Relevant Imaging Results:  Relevant Lab Results:   Additional Information SSN: [place], [place]

## 2016-10-06 NOTE — Evaluation (Signed)
Physical Therapy Evaluation Patient Details Name: Jasmin Lee MRN: [id number] DOB: [birth year] Today's Date: 10/06/2016   History of Present Illness  Pt is a 83 y/o female who presents s/p mechanical fall at home where she lives alone. MRI revealed acute non-displaced fracture of S4 segment of sacrum, and soft tissue contusions along the posterior and lateral aspect of thighs and hips L>R.   Clinical Impression  Pt admitted with above diagnosis. Pt currently with functional limitations due to the deficits listed below (see PT Problem List). At the time of PT eval pt was able to perform transfers with +2 assist for safety and balance support. Pain is the main limiting factor at this time. Pt currently living alone and reports that she does not have available assist that would be reliable or consistent. During session, pt incontinent of bowel and required extensive assistance to clean up and transfer out of the chair. Do not feel she is safe to be alone at this time. Pt very much wants to return home at d/c and I'm not sure she is thinking realistically about being able to manage alone. Strongly feel that continued rehab to maximize function would be in her best interest prior to returning home alone. Pt will benefit from skilled PT to increase their independence and safety with mobility to allow discharge to the venue listed below.       Follow Up Recommendations SNF;Supervision/Assistance - 24 hour    Equipment Recommendations  3in1 (PT)    Recommendations for Other Services OT consult     Precautions / Restrictions Precautions Precautions: Fall Restrictions Weight Bearing Restrictions: No      Mobility  Bed Mobility Overal bed mobility: Needs Assistance Bed Mobility: Sit to Supine       Sit to supine: Min assist   General bed mobility comments: Assist for LE elevation up into the bed. Increased time to complete. +2 for scoot up to [facility] with bed pad.   Transfers Overall  transfer level: Needs assistance Equipment used: 2 person hand held assist Transfers: Sit to/from [facility] Sit to Stand: Mod assist Stand pivot transfers: Mod assist;+2 physical assistance;+2 safety/equipment       General transfer comment: Pain limiting mobility. Pt stood edge of chair for peri-care (had a bowel movement in the chair) and required +2 for pivotal steps around to the bed. Pt was not able to manage her own peri-care.   Ambulation/Gait             General Gait Details: Not able to attempt due to pain  Stairs            Wheelchair Mobility    Modified Rankin (Stroke Patients Only)       Balance Overall balance assessment: Needs assistance Sitting-balance support: No upper extremity supported;Feet supported Sitting balance-Leahy Scale: Poor     Standing balance support: During functional activity;Bilateral upper extremity supported Standing balance-Leahy Scale: Zero Standing balance comment: +2 required                              Pertinent Vitals/Pain Pain Assessment: Faces Faces Pain Scale: Hurts whole lot Pain Location: Pelvis/L hip Pain Descriptors / Indicators: Grimacing;Guarding;Discomfort Pain Intervention(s): Limited activity within patient's tolerance;Monitored during session;Repositioned    Home Living Family/patient expects to be discharged to:: Private residence Living Arrangements: Alone Available Help at Discharge: Neighbor;Available PRN/intermittently;Personal care attendant Type of Home: House Home Access: Level entry  Home Layout: One level Home Equipment: [place] - 2 wheels;Tub bench;Grab bars - tub/shower;Grab bars - toilet Additional Comments: personal care attendent comes in every thursday to assist with errands and IADL's. Pt has someone coming in to clean occasionally.    Prior Function Level of Independence: Independent         Comments: Typically cooks in microwave,  independent with ADL's. Husband passed away in 08-24-2022 and does not consider herself to live alone but she essentially is now.      Hand Dominance        Extremity/Trunk Assessment   Upper Extremity Assessment Upper Extremity Assessment: Defer to OT evaluation    Lower Extremity Assessment Lower Extremity Assessment: RLE deficits/detail;LLE deficits/detail RLE Deficits / Details: Generalized weakness; acute pain and decreased AROM consistent with above mentioned diagnosis.  LLE Deficits / Details: Generalized weakness. L side more painful than the right. Noted significant bruising along the L hip. Decreased strength and AROM consistent with the above mentioned diagnosis.     Cervical / Trunk Assessment Cervical / Trunk Assessment: Kyphotic  Communication   Communication: HOH  Cognition Arousal/Alertness: Awake/alert Behavior During Therapy: WFL for tasks assessed/performed Overall Cognitive Status: Within Functional Limits for tasks assessed                                        General Comments      Exercises     Assessment/Plan    PT Assessment Patient needs continued PT services  PT Problem List Decreased strength;Decreased range of motion;Decreased activity tolerance;Decreased balance;Decreased mobility;Decreased knowledge of use of DME;Decreased safety awareness;Decreased knowledge of precautions;Pain       PT Treatment Interventions DME instruction;Gait training;Stair training;Functional mobility training;Therapeutic activities;Therapeutic exercise;Neuromuscular re-education;Patient/family education    PT Goals (Current goals can be found in the Care Plan section)  Acute Rehab PT Goals Patient Stated Goal: Return home from the hospital PT Goal Formulation: With patient Time For Goal Achievement: 10/20/16 Potential to Achieve Goals: Fair    Frequency Min 3X/week   Barriers to discharge Decreased caregiver support Lives alone, no consistent  assist available and no 24 hour assist    Co-evaluation               AM-PAC PT "6 Clicks" Daily Activity  Outcome Measure Difficulty turning over in bed (including adjusting bedclothes, sheets and blankets)?: Total Difficulty moving from lying on back to sitting on the side of the bed? : Total Difficulty sitting down on and standing up from a chair with arms (e.g., wheelchair, bedside commode, etc,.)?: Total Help needed moving to and from a bed to chair (including a wheelchair)?: A Lot Help needed walking in hospital room?: A Lot Help needed climbing 3-5 steps with a railing? : Total 6 Click Score: 8    End of Session Equipment Utilized During Treatment: Gait belt Activity Tolerance: Patient limited by pain Patient left: in bed;with call bell/phone within reach;with bed alarm set;with family/visitor present Nurse Communication: Mobility status PT Visit Diagnosis: Unsteadiness on feet (R26.81);Pain;History of falling (Z91.81);Difficulty in walking, not elsewhere classified (R26.2) Pain - Right/Left: Left Pain - part of body: Hip    Time: 5956-3875 PT Time Calculation (min) (ACUTE ONLY): 26 min   Charges:   PT Evaluation $PT Eval Moderate Complexity: 1 Procedure PT Treatments $Gait Training: 8-22 mins   PT G Codes:  Rolinda Roan, PT, DPT Acute Rehabilitation Services Pager: [phone]   Thelma Comp 10/06/2016, 2:59 PM

## 2016-10-06 NOTE — Progress Notes (Signed)
PROGRESS NOTE    Jasmin Lee  EVO:[id number]  DOB: 06-30-1922  DOA: 10/05/2016 PCP: [name], MD Outpatient Specialists:   Admission Hx: Jasmin Lee is a 83 y.o. female who lives alone with medical history significant of cardiomyopathy, hypertension, uterine cancer, anemia, chronic bronchitis/COPD, HTN and dyslipidemia presented to the ED via EMS for evaluation of left hip pain.  Patient states she was walking from a tiled floor to a carpeted floor, tripped over tand fell landing on her left side.  She has a sacral fracture but no hip fracture.    Assessment & Plan:   Left hip pain / sacral fracture An attempt was made to ambulate patient, but she was unable to bear weight on the left leg  MRI of the hip and pelvis positive for sacral fracture but no hip fracture.  PT evaluation recommending SNF but patient somewhat resistant Provide pain control and FALL PRECAUTIONS  Anemia - last Hgb available in the EPIC is 9.8 in 2015, today's Hgb is 7.7 g/dL Iron panel with low iron level, will follow HH, much improved after 2 U PRBC.  Continue to monitor and transfuse if needed  Severe hyponatremia and hypokalemia - most likely related to HCTZ 25 mg which should not be restarted in this patient. Slowly improving sodium level which is appropriate.   Continue IV NS and replace potassium and recheck in the morning  HTN Continue home meds and monitor BP readings Adjust meds if needed  Dyslipidemia Continue Lipitor  Asthma/COPD  Stable at present Continue home inhaler, Nebs, prn, Advair discus and Singulair  DVT prophylaxis: SCD Code Status: Full Family Communication: none Disposition Plan: MedSurg Consults called: none Admission status: Inpatient  Subjective: Pt still hurting but was able to sit up in a chair today.  Very unsteady on feet per PT.    Objective: Vitals:   10/06/16 0025 10/06/16 0237 10/06/16 0533 10/06/16 1446  BP: (!) 147/61 (!) 150/59  (!) 151/46 (!) 162/83  Pulse: 78 71 71 72  Resp: 16 16 18 17   Temp: 97.8 F (36.6 C) 98.2 F (36.8 C) 98 F (36.7 C) 97.7 F (36.5 C)  TempSrc: Oral Oral  Oral  SpO2: 100% 99% 100% 99%    Intake/Output Summary (Last 24 hours) at 10/06/16 1519 Last data filed at 10/06/16 0953  Gross per 24 hour  Intake             1889 ml  Output                0 ml  Net             1889 ml   There were no vitals filed for this visit.  Exam:  General exam: elderly female, awake, alert, NAD, oriented x 3.  Respiratory system: Clear. No increased work of breathing. Cardiovascular system: S1 & S2 heard. No JVD, murmurs, gallops, clicks or pedal edema. Gastrointestinal system: Abdomen is nondistended, soft and nontender. Normal bowel sounds heard. Central nervous system: Alert and oriented. No focal neurological deficits. Extremities: no cyanosis.    Data Reviewed: Basic Metabolic Panel:  Recent Labs Lab 10/05/16 1144 10/05/16 1820 10/06/16 0702  NA 114* 115* 120*  K 3.2* 3.1* 3.9  CL 81* 85* 90*  CO2 25 23 21*  GLUCOSE 97 102* 73  BUN 12 11 11   CREATININE 0.84 0.69 0.68  CALCIUM 8.2* 7.6* 8.0*  MG  --   --  1.8   Liver Function Tests: No results  for input(s): AST, ALT, ALKPHOS, BILITOT, PROT, ALBUMIN in the last 168 hours. No results for input(s): LIPASE, AMYLASE in the last 168 hours. No results for input(s): AMMONIA in the last 168 hours. CBC:  Recent Labs Lab 10/05/16 1144 10/05/16 1820 10/06/16 0702  WBC 6.9  --  7.7  NEUTROABS 5.0  --   --   HGB 7.7* 6.6* 10.2*  HCT 21.9* 18.8* 30.1*  MCV 71.6*  --  74.0*  PLT 303  --  284   Cardiac Enzymes: No results for input(s): CKTOTAL, CKMB, CKMBINDEX, TROPONINI in the last 168 hours. CBG (last 3)  No results for input(s): GLUCAP in the last 72 hours. No results found for this or any previous visit (from the past 240 hour(s)).   Studies: Dg Thoracic Spine 2 View  Result Date: 10/05/2016 CLINICAL DATA:  Thoracic spine  pain after fall today. EXAM: THORACIC SPINE 2 VIEWS COMPARISON:  CT scan of June 11, 2013. FINDINGS: No fracture or spondylolisthesis is noted. Degenerative disc disease is noted in the mid and lower thoracic spine. Atherosclerosis of thoracic aorta is noted. IMPRESSION: Multilevel degenerative disc disease. No acute abnormality seen in the thoracic spine. Aortic atherosclerosis. Electronically Signed   By: Marijo Conception, M.D.   On: 10/05/2016 10:34   Mr Pelvis Wo Contrast  Result Date: 10/05/2016 CLINICAL DATA:  Tripped at home landing on left side. Left pelvic area tender to touch. EXAM: MRI PELVIS WITHOUT CONTRAST TECHNIQUE: Multiplanar multisequence MR imaging of the pelvis was performed. No intravenous contrast was administered. COMPARISON:  Pelvis and left hip radiographs from 10/05/2016 FINDINGS: Urinary Tract:  No abnormality visualized. Bowel:  Unremarkable visualized pelvic bowel loops. Vascular/Lymphatic: No pathologically enlarged lymph nodes. No significant vascular abnormality seen. Reproductive:  No mass or other significant abnormality Other: Soft tissue edema overlying the posterior and lateral aspect of both hips and thighs is identified left greater than right consistent with soft tissue contusions. Musculoskeletal: The visualized lumbar spine from L3 through S1 demonstrates disc space narrowing with disc desiccation. There is grade 1 anterolisthesis of L5 on S1. Small disc-osteophyte complexes are noted of the visualized lumbar spine. Small focus of marrow edema with transverse linear hypointensity consistent with a nondisplaced fracture of the S4 segment of the sacrum is noted, series 9, image 30, series 5, image 3, and series 3, image 3. No additional pelvic fracture is identified. Joint space narrowing is noted of both hips with small physiologic amount of joint fluid present bilaterally. No fracture of the proximal femora. No intramuscular hemorrhage nor atrophy. IMPRESSION: 1. Acute  nondisplaced fracture of the S4 segment of the sacrum. 2. Soft tissue contusions along the posterior and lateral aspect of the thighs and hips left-greater-than-right. 3. No acute fracture of either hip nor dislocation. Mild joint space narrowing of the hips consistent with osteoarthritis. 4. Lumbar spondylosis with grade 1 spondylolisthesis of L5 on S1. Electronically Signed   By: Ashley Royalty M.D.   On: 10/05/2016 17:49   Dg Hip Unilat W Or Wo Pelvis 2-3 Views Left  Result Date: 10/05/2016 CLINICAL DATA:  Status post fall EXAM: DG HIP (WITH OR WITHOUT PELVIS) 2-3V LEFT COMPARISON:  None. FINDINGS: There is no evidence of hip fracture or dislocation. There is no evidence of arthropathy or other focal bone abnormality. Lumbar spine spondylosis. IMPRESSION: No acute osseous injury of the left hip. Electronically Signed   By: Kathreen Devoid   On: 10/05/2016 10:57   Scheduled Meds: . atorvastatin  10  mg Oral QODAY  . diltiazem  120 mg Oral Daily  . hydrALAZINE  10 mg Oral Q6H  . losartan  150 mg Oral Daily  . mometasone-formoterol  2 puff Inhalation BID  . montelukast  10 mg Oral QHS  . multivitamin with minerals  1 tablet Oral Q breakfast  . pantoprazole  40 mg Oral Daily  . polyvinyl alcohol  1 drop Both Eyes TID   Continuous Infusions: . sodium chloride 50 mL/hr at 10/06/16 1106    Principal Problem:   Left hip pain Active Problems:   Essential hypertension   Dyslipidemia   Hyponatremia   Hypokalemia   Anemia   Anemia due to chronic kidney disease   Time spent:   Irwin Brakeman, MD, FAAFP [facility] Pager [phone]  If 7PM-7AM, please contact night-coverage [email or web address] Password [email or web address] 10/06/2016, 3:19 PM    LOS: 1 day

## 2016-10-06 NOTE — Clinical Social Work Note (Signed)
Clinical Social Work Assessment  Patient Details  Name: Jasmin Lee MRN: [id number] Date of Birth: March 03, 1923  Date of referral:  10/06/16               Reason for consult:  Facility Placement                Permission sought to share information with:  Facility Sport and exercise psychologist, Family Supports Permission granted to share information::  Yes, Verbal Permission Granted  Name::     Jasmin Lee::  SNFs  Relationship::  Designer, fashion/clothing Information:  [phone]  Housing/Transportation Living arrangements for the past 2 months:  [place] of Information:  Patient, Friend/Neighbor Patient Interpreter Needed:  None Criminal Activity/Legal Involvement Pertinent to Current Situation/Hospitalization:  No - Comment as needed Significant Relationships:  Friend, Neighbor Lives with:  Self Do you feel safe going back to the place where you live?  No Need for family participation in patient care:  No (Coment)  Care giving concerns:  CSW received consult for possible SNF placement at time of discharge. CSW spoke with patient regarding PT recommendation of SNF placement at time of discharge. Patient's neighbor, Jasmin Lee, was also at bedside. Jasmin Lee helps patient and patient has a caregiver with [facility] [employer]. Patient reported that given patient's current physical needs and fall risk, she would like to go to rehab for a little while. Patient expressed understanding of PT recommendation and is agreeable to SNF placement at time of discharge. CSW to continue to follow and assist with discharge planning needs.   Social Worker assessment / plan:  CSW spoke with patient concerning possibility of rehab at [facility] before returning home.  Employment status:  Retired Nurse, adult PT Recommendations:  [place] / Referral to community resources:  [place]  Patient/Family's Response to care:  Patient recognizes need for  rehab before returning home and is agreeable to a SNF in [place]. CSW provided bed offers to patient and Jasmin Lee. Patient reported preference for a private room at [facility].  Patient/Family's Understanding of and Emotional Response to Diagnosis, Current Treatment, and Prognosis:  Patient/family is realistic regarding therapy needs and expressed being hopeful for SNF placement. Patient expressed understanding of CSW role and discharge process as well as her medical needs. No questions/concerns about plan or treatment.    Emotional Assessment Appearance:  Appears stated age Attitude/Demeanor/Rapport:  Other (Appropriate) Affect (typically observed):  Accepting, Appropriate Orientation:  Oriented to Self, Oriented to Place, Oriented to  Time, Oriented to Situation Alcohol / Substance use:  Not Applicable Psych involvement (Current and /or in the community):  No (Comment)  Discharge Needs  Concerns to be addressed:  Care Coordination Readmission within the last 30 days:  No Current discharge risk:  None Barriers to Discharge:  Continued Medical Work up   [employer], [place] 10/06/2016, 4:48 PM

## 2016-10-07 DIAGNOSIS — D649 Anemia, unspecified: Secondary | ICD-10-CM

## 2016-10-07 DIAGNOSIS — M25552 Pain in left hip: Secondary | ICD-10-CM

## 2016-10-07 DIAGNOSIS — E785 Hyperlipidemia, unspecified: Secondary | ICD-10-CM

## 2016-10-07 DIAGNOSIS — E876 Hypokalemia: Secondary | ICD-10-CM

## 2016-10-07 DIAGNOSIS — I10 Essential (primary) hypertension: Secondary | ICD-10-CM

## 2016-10-07 DIAGNOSIS — E871 Hypo-osmolality and hyponatremia: Secondary | ICD-10-CM

## 2016-10-07 LAB — CBC
HCT: 30.6 % — ABNORMAL LOW (ref 36.0–46.0)
Hemoglobin: 10.5 g/dL — ABNORMAL LOW (ref 12.0–15.0)
MCH: 25.4 pg — AB (ref 26.0–34.0)
MCHC: 34.3 g/dL (ref 30.0–36.0)
MCV: 73.9 fL — AB (ref 78.0–100.0)
PLATELETS: 293 10*3/uL (ref 150–400)
RBC: 4.14 MIL/uL (ref 3.87–5.11)
RDW: 15.2 % (ref 11.5–15.5)
WBC: 8.9 10*3/uL (ref 4.0–10.5)

## 2016-10-07 LAB — BASIC METABOLIC PANEL
Anion gap: 6 (ref 5–15)
BUN: 13 mg/dL (ref 6–20)
CALCIUM: 8 mg/dL — AB (ref 8.9–10.3)
CHLORIDE: 93 mmol/L — AB (ref 101–111)
CO2: 23 mmol/L (ref 22–32)
CREATININE: 0.83 mg/dL (ref 0.44–1.00)
GFR calc non Af Amer: 59 mL/min — ABNORMAL LOW (ref >60)
GLUCOSE: 96 mg/dL (ref 65–99)
Potassium: 3.9 mmol/L (ref 3.5–5.1)
Sodium: 122 mmol/L — ABNORMAL LOW (ref 135–145)

## 2016-10-07 LAB — MAGNESIUM: MAGNESIUM: 1.7 mg/dL (ref 1.7–2.4)

## 2016-10-07 MED ORDER — SODIUM CHLORIDE 0.9 % IV SOLN
INTRAVENOUS | Status: AC
  Administered 2016-10-07: 10:00:00 via INTRAVENOUS

## 2016-10-07 NOTE — Progress Notes (Signed)
PT Cancellation Note  Patient Details Name: ALEKHYA GRAVLIN MRN: [id number] DOB: 07-Jan-1923   Cancelled Treatment:    Reason Eval/Treat Not Completed: Fatigue/lethargy limiting ability to participate. Pt reports she is very fatigued and does not want to participate with therapy at this time. Asked therapist to get in touch with CSW as she has reconsidered SNF at d/c and now would like to go to rehab prior to return home. Message left for [place], CSW,    Thelma Comp 10/07/2016, 3:06 PM   Rolinda Roan, PT, DPT Acute Rehabilitation Services Pager: [phone]

## 2016-10-07 NOTE — Progress Notes (Signed)
[facility] PROGRESS NOTE  Jasmin Lee ZYY:[id number] DOB: 1922/08/23 DOA: 10/05/2016 PCP: [name], MD  Interim summary and history of present illness 83 y.o.femalewho lives alone with medical history significant of cardiomyopathy, hypertension, uterine cancer, anemia, chronic bronchitis/COPD, HTN and dyslipidemia presented to the ED via EMS for evaluation of left hip pain.  Patient states she was walking from a tiled floor to a carpeted floor, tripped over tand fell landing on her left side.  She has a sacral fracture but no hip fracture.    Assessment/Plan: Left hip pain/sacral fracture -MRI of the hip and pelvis is positive for sacral fracture without involvement of her hip joint -Curbside discussion with orthopedic service sustained and recommended pain medication, physical rehabilitation and fall precautions. -No surgical intervention needed -Given physical limitations and lack of mobility patient will require skilled nursing facility at discharge. Patient has been seen by physical therapy and occupational therapy at this moment. -Patient is in agreement with placement for rehabilitation.  Anemia -Appears to be anemia of chronic disease -No signs of acute bleeding appreciated -Last hemoglobin in [facility] was 9.8 in 2015; and on the moment of admission her hemoglobin was 7.7 -2 units of blood were given and patient hemoglobin has remained stable and currently is 10.5 -Will monitor intermittently  Severe hyponatremia and hypokalemia -per electronic record review; patient with chronic hyponatremia -Worse at this moment; on admission sodium level was 114 -Patient without any neurologic complaints -She was chronically on HCTZ -Diuretics has been discontinue at this moment -Will continue IV fluids and follow electrolytes trend  HTN -Blood pressure is a stable and well controlled -HCTZ has been discontinued; will continue the rest of her antihypertensive  regimen  Hyperlipidemia -Continue Lipitor  Asthma/COPD -Stable and currently no wheezing -Will continue home inhaler regimen   Code Status: Full code Family Communication: No family at bedside Disposition Plan: SNF at discharge for further rehabilitation and conditioning. Social worker is aware and hopefully plan is for discharge on 10/08/2016   Consultants:  None  Procedures:  See below for x-ray reports  Antibiotics:  None  HPI/Subjective: Patient is afebrile, denies chest pain, no shortness of breath. Reports pain in her hip and at this moment understand the need for rehabilitation prior to being discharged back home.  Objective: Vitals:   10/07/16 1524 10/07/16 1846  BP: (!) 128/45 (!) 130/58  Pulse: (!) 45   Resp: 18   Temp: 99.2 F (37.3 C)     Intake/Output Summary (Last 24 hours) at 10/07/16 1913 Last data filed at 10/07/16 1549  Gross per 24 hour  Intake                0 ml  Output              300 ml  Net             -300 ml   There were no vitals filed for this visit.  Exam:   General: Afebrile, denies chest pain or shortness of breath. Patient endorses some pain in her hip with movement and when bearing weight. No nausea, no vomiting  Cardiovascular: S1 and S2, no rubs, no gallops  Respiratory: Good air movement bilaterally, no wheezing, no crackles  Abdomen: soft, nontender, nondistended, positive bowel sounds  Musculoskeletal:  no edema or cyanosis; no clubbing  Data Reviewed: Basic Metabolic Panel:  Recent Labs Lab 10/05/16 1144 10/05/16 1820 10/06/16 0702 10/07/16 0741  NA 114* 115* 120* 122*  K 3.2* 3.1*  3.9 3.9  CL 81* 85* 90* 93*  CO2 25 23 21* 23  GLUCOSE 97 102* 73 96  BUN 12 11 11 13   CREATININE 0.84 0.69 0.68 0.83  CALCIUM 8.2* 7.6* 8.0* 8.0*  MG  --   --  1.8 1.7   CBC:  Recent Labs Lab 10/05/16 1144 10/05/16 1820 10/06/16 0702 10/07/16 0741  WBC 6.9  --  7.7 8.9  NEUTROABS 5.0  --   --   --   HGB 7.7*  6.6* 10.2* 10.5*  HCT 21.9* 18.8* 30.1* 30.6*  MCV 71.6*  --  74.0* 73.9*  PLT 303  --  284 293    Studies: No results found.  Scheduled Meds: . acetaminophen  650 mg Oral Q6H  . atorvastatin  10 mg Oral QODAY  . diltiazem  120 mg Oral Daily  . hydrALAZINE  10 mg Oral Q6H  . losartan  150 mg Oral Daily  . mometasone-formoterol  2 puff Inhalation BID  . montelukast  10 mg Oral QHS  . multivitamin with minerals  1 tablet Oral Q breakfast  . pantoprazole  40 mg Oral Daily  . polyvinyl alcohol  1 drop Both Eyes TID   Continuous Infusions: . sodium chloride 75 mL/hr at 10/07/16 1028    Principal Problem:   Left hip pain Active Problems:   Essential hypertension   Dyslipidemia   Hyponatremia   Hypokalemia   Anemia   Anemia due to chronic kidney disease    Time spent: 25 minutes    [name]  [facility] Pager [phone]. If 7PM-7AM, please contact night-coverage at [email or web address], password [facility] 10/07/2016, 7:13 PM  LOS: 2 days

## 2016-10-07 NOTE — Progress Notes (Signed)
OT Cancellation Note  Patient Details Name: Jasmin Lee MRN: [id number] DOB: Sep 12, 1922   Cancelled Treatment:    Reason Eval/Treat Not Completed: Patient declined, no reason specified. Pt declining OOB at this time - she reports that she just used the BSc with assist from RN staff and is too tired. "I just want to be left alone" at this time Pt is requesting SNF placement prior to return home as she lives alone. Based on conversation with PT, Pt, RN, OT updated dc recommendations to reflect this and will perform evaluation as schedule allows.   [place] 10/07/2016, 11:41 AM  Hulda Humphrey OTR/L [phone]

## 2016-10-08 DIAGNOSIS — M25559 Pain in unspecified hip: Secondary | ICD-10-CM | POA: Diagnosis not present

## 2016-10-08 DIAGNOSIS — S79919A Unspecified injury of unspecified hip, initial encounter: Secondary | ICD-10-CM | POA: Diagnosis not present

## 2016-10-08 DIAGNOSIS — J45998 Other asthma: Secondary | ICD-10-CM | POA: Diagnosis not present

## 2016-10-08 DIAGNOSIS — R262 Difficulty in walking, not elsewhere classified: Secondary | ICD-10-CM | POA: Diagnosis not present

## 2016-10-08 DIAGNOSIS — J4540 Moderate persistent asthma, uncomplicated: Secondary | ICD-10-CM | POA: Diagnosis not present

## 2016-10-08 DIAGNOSIS — M6281 Muscle weakness (generalized): Secondary | ICD-10-CM | POA: Diagnosis not present

## 2016-10-08 DIAGNOSIS — D649 Anemia, unspecified: Secondary | ICD-10-CM | POA: Diagnosis not present

## 2016-10-08 DIAGNOSIS — S3210XA Unspecified fracture of sacrum, initial encounter for closed fracture: Secondary | ICD-10-CM | POA: Diagnosis not present

## 2016-10-08 DIAGNOSIS — N189 Chronic kidney disease, unspecified: Secondary | ICD-10-CM | POA: Diagnosis not present

## 2016-10-08 DIAGNOSIS — D631 Anemia in chronic kidney disease: Secondary | ICD-10-CM | POA: Diagnosis not present

## 2016-10-08 DIAGNOSIS — I429 Cardiomyopathy, unspecified: Secondary | ICD-10-CM | POA: Diagnosis not present

## 2016-10-08 DIAGNOSIS — M2550 Pain in unspecified joint: Secondary | ICD-10-CM | POA: Diagnosis not present

## 2016-10-08 DIAGNOSIS — R1310 Dysphagia, unspecified: Secondary | ICD-10-CM | POA: Diagnosis not present

## 2016-10-08 DIAGNOSIS — E876 Hypokalemia: Secondary | ICD-10-CM | POA: Diagnosis not present

## 2016-10-08 DIAGNOSIS — I10 Essential (primary) hypertension: Secondary | ICD-10-CM | POA: Diagnosis not present

## 2016-10-08 DIAGNOSIS — E785 Hyperlipidemia, unspecified: Secondary | ICD-10-CM | POA: Diagnosis not present

## 2016-10-08 DIAGNOSIS — E871 Hypo-osmolality and hyponatremia: Secondary | ICD-10-CM | POA: Diagnosis not present

## 2016-10-08 DIAGNOSIS — E878 Other disorders of electrolyte and fluid balance, not elsewhere classified: Secondary | ICD-10-CM | POA: Diagnosis not present

## 2016-10-08 DIAGNOSIS — S3210XD Unspecified fracture of sacrum, subsequent encounter for fracture with routine healing: Secondary | ICD-10-CM | POA: Diagnosis not present

## 2016-10-08 DIAGNOSIS — J449 Chronic obstructive pulmonary disease, unspecified: Secondary | ICD-10-CM | POA: Diagnosis not present

## 2016-10-08 DIAGNOSIS — M25552 Pain in left hip: Secondary | ICD-10-CM | POA: Diagnosis not present

## 2016-10-08 DIAGNOSIS — R5381 Other malaise: Secondary | ICD-10-CM

## 2016-10-08 DIAGNOSIS — J42 Unspecified chronic bronchitis: Secondary | ICD-10-CM | POA: Diagnosis not present

## 2016-10-08 DIAGNOSIS — T7840XA Allergy, unspecified, initial encounter: Secondary | ICD-10-CM | POA: Diagnosis not present

## 2016-10-08 LAB — BASIC METABOLIC PANEL
ANION GAP: 7 (ref 5–15)
BUN: 17 mg/dL (ref 6–20)
CALCIUM: 7.6 mg/dL — AB (ref 8.9–10.3)
CO2: 22 mmol/L (ref 22–32)
CREATININE: 0.88 mg/dL (ref 0.44–1.00)
Chloride: 94 mmol/L — ABNORMAL LOW (ref 101–111)
GFR, EST NON AFRICAN AMERICAN: 55 mL/min — AB (ref >60)
Glucose, Bld: 94 mg/dL (ref 65–99)
Potassium: 4 mmol/L (ref 3.5–5.1)
SODIUM: 123 mmol/L — AB (ref 135–145)

## 2016-10-08 MED ORDER — LOSARTAN POTASSIUM 50 MG PO TABS: 150.0000 mg | ORAL_TABLET | Freq: Every day | ORAL | Status: DC

## 2016-10-08 MED ORDER — POLYETHYLENE GLYCOL 3350 17 G PO PACK: 17.0000 g | PACK | Freq: Every day | ORAL | Status: AC | PRN

## 2016-10-08 MED ORDER — HYDRALAZINE HCL 10 MG PO TABS: 10.0000 mg | ORAL_TABLET | Freq: Four times a day (QID) | ORAL | Status: DC

## 2016-10-08 MED ORDER — TRAMADOL HCL 50 MG PO TABS: 50.0000 mg | ORAL_TABLET | Freq: Four times a day (QID) | ORAL | 0 refills | Status: DC | PRN

## 2016-10-08 NOTE — Progress Notes (Signed)
Patient will DC to: [facility] Anticipated DC date: 10/08/17 Family notified: Occupational psychologist by: Domenica Reamer   Per MD patient ready for DC to [facility]. RN, patient, patient's family, and facility notified of DC. Discharge Summary sent to facility. RN given number for report [phone]). DC packet on chart. Ambulance transport requested for patient.   CSW signing off.  Cedric Fishman, [place] Social Worker [phone]

## 2016-10-08 NOTE — Progress Notes (Signed)
OT Cancellation Note  Patient Details Name: Jasmin Lee MRN: [id number] DOB: 06-17-1922   Cancelled Treatment:    Reason Eval/Treat Not Completed: Other (comment) Current D/C plan is SNF. No apparent immediate acute care OT needs, therefore will defer OT to SNF. If OT eval is needed please call Acute Rehab Dept. at [phone] or text page OT at [phone].   [place], OT/L  [phone] 10/08/2016 10/08/2016, 2:09 PM

## 2016-10-08 NOTE — Discharge Summary (Addendum)
Physician Discharge Summary  Jasmin Lee DXI:[id number] DOB: Apr 27, 1923 DOA: 10/05/2016  PCP: [name], MD  Admit date: 10/05/2016 Discharge date: 10/08/2016  Time spent: 35 minutes  Recommendations for Outpatient Follow-up:  1. Reassess blood pressure and adjust antihypertensive regimen as needed 2. Repeat basic metabolic panel in 5 days to check electrolytes and renal function 3. Repeat CBC in 5 days to follow hemoglobin trend   Discharge Diagnoses:  Principal Problem:   Left hip pain Active Problems:   Essential hypertension   Dyslipidemia   Hyponatremia   Hypokalemia   Anemia   Anemia due to chronic disease   Physical deconditioning   Discharge Condition: Overall stable and improved. Patient discharged to skilled nursing facility [facility]) for further care and physical rehabilitation. Pain well controlled with current analgesics.  Diet recommendation: Regular diet  Brief History of present illness:  83 y.o.femalewho lives alone with medical history significant of cardiomyopathy, hypertension, uterine cancer, anemia, chronic bronchitis/COPD, HTN and dyslipidemia presented to the ED via EMS for evaluation of left hip pain. Patient states she was walking from a tiled floor to a carpeted floor, tripped over tand fell landing on her left side. She has a sacral fracture but no hip fracture.   Hospital Course:  Left hip pain/sacral fracture -MRI of the hip and pelvis is positive for sacral fracture without involvement of her hip joint -Curbside discussion with orthopedic service sustained and recommendations for pain control, physical rehabilitation and fall precautions received. -No surgical intervention needed for this fracture. -Given physical limitations and lack of mobility patient will require skilled nursing facility at discharge. Patient has been seen by physical therapy and occupational therapy at this moment; they both recommended SNF. -Patient is in  agreement with placement for rehabilitation. -patient discharge on tramadol and tylenol.  Anemia -Appears to be anemia of chronic disease -No signs of acute bleeding appreciated or reported by patient. -Last hemoglobin in our records was 9.8 in 2015; and on the moment of admission her hemoglobin was 7.7 -2 units of blood were given and patient hemoglobin has remained stable and last CBC demonstrated a level of 10.5 prior to discharge -Will recommend CBC in 5 days to follow hemoglobin trend.   Severe hyponatremia and hypokalemia -per electronic record review; patient with chronic hyponatremia -Worse at this time; on admission sodium level was 114 -Patient without any neurologic complaints -She was chronically on HCTZ; which has been now discontinue at discharge -Potassium repleted and within normal limits -Up to regular diet and IVF's resuscitation Patient Sodium 123 at discharge (in range for her chronic low levels) -We'll recommend basic metabolic panel in 5 days to continue tracking electrolytes trend.  HTN -Blood pressure is stable and well controlled -HCTZ has been discontinued; will continue the rest of her antihypertensive regimen -Recommend reassessment of her blood pressure and adjustment in antihypertensive regimen as needed.  Hyperlipidemia -Continue Lipitor  Asthma/COPD/allergic rhinitis -Stable and currently no wheezing -Will continue home inhaler regimen; will continue also Singulair -Will resume Flonase  Procedures:  See below for x-ray reports  Consultations:  Orthopedic service curbside (Dr. Ninfa Linden)  Discharge Exam: Vitals:   10/08/16 0517 10/08/16 0823  BP: (!) 139/53 (!) 144/60  Pulse: 60   Resp: 17   Temp: 97.6 F (36.4 C)     General: Afebrile, denies chest pain or shortness of breath. Patient endorses some pain in her hip with movement and when bearing weight. No nausea, no vomiting. Good insight, oriented 3 and able to  follow commands  properly.  Cardiovascular: S1 and S2, no rubs, no gallops  Respiratory: Good air movement bilaterally, no wheezing, no crackles  Abdomen: soft, nontender, nondistended, positive bowel sounds  Musculoskeletal:  no edema or cyanosis; no clubbing   Discharge Instructions   Discharge Instructions    Diet general    Complete by:  As directed    Discharge instructions    Complete by:  As directed    Maintain adequate hydration Repeat basic metabolic panel in 5 days Regular diet and good nutrition to be Encouraged Physical therapy as per skilled nursing facility protocol Arrange follow-up with PCP in 2 weeks after being discharge from skilled facility     Current Discharge Medication List    START taking these medications   Details  polyethylene glycol (MIRALAX / GLYCOLAX) packet Take 17 g by mouth daily as needed for mild constipation.    traMADol (ULTRAM) 50 MG tablet Take 1 tablet (50 mg total) by mouth every 6 (six) hours as needed for moderate pain. Qty: 20 tablet, Refills: 0      CONTINUE these medications which have CHANGED   Details  hydrALAZINE (APRESOLINE) 10 MG tablet Take 1 tablet (10 mg total) by mouth every 6 (six) hours.    losartan (COZAAR) 50 MG tablet Take 3 tablets (150 mg total) by mouth daily.      CONTINUE these medications which have NOT CHANGED   Details  acetaminophen (TYLENOL) 500 MG tablet Take 1,000 mg by mouth every 6 (six) hours as needed for mild pain, moderate pain, fever or headache.     albuterol (PROVENTIL HFA;VENTOLIN HFA) 108 (90 Base) MCG/ACT inhaler Inhale 2 puffs into the lungs every 6 (six) hours as needed for wheezing or shortness of breath.    albuterol (PROVENTIL) (2.5 MG/3ML) 0.083% nebulizer solution Take 2.5 mg by nebulization every 3 (three) days as needed for wheezing or shortness of breath.     atorvastatin (LIPITOR) 10 MG tablet Take 10 mg by mouth every other day.     Cholecalciferol (VITAMIN D PO) Take 1 tablet by  mouth daily.    Cyanocobalamin (VITAMIN B-12 PO) Take 1 tablet by mouth daily with breakfast.     diclofenac sodium (VOLTAREN) 1 % GEL Apply 1 application topically daily.     DILT-XR 120 MG 24 hr capsule TAKE 1 CAPSULE DAILY Qty: 90 capsule, Refills: 0    fluticasone (FLONASE) 50 MCG/ACT nasal spray Place 2 sprays into both nostrils at bedtime.     Fluticasone-Salmeterol (ADVAIR) 250-50 MCG/DOSE AEPB Inhale 1 puff into the lungs 2 (two) times daily.    Menthol, Topical Analgesic, (ICY HOT EX) Apply 1 application topically as needed (for pain).    montelukast (SINGULAIR) 10 MG tablet Take 10 mg by mouth at bedtime.     Multiple Vitamin (MULTIVITAMIN WITH MINERALS) TABS tablet Take 1 tablet by mouth daily with breakfast.     pantoprazole (PROTONIX) 40 MG tablet Take 1 tablet (40 mg total) by mouth daily. Qty: 30 tablet, Refills: 1    polyvinyl alcohol (ARTIFICIAL TEARS) 1.4 % ophthalmic solution Place 1 drop into both eyes 3 (three) times daily.    vitamin C (ASCORBIC ACID) 250 MG tablet Take 250 mg by mouth daily.      STOP taking these medications     hydrochlorothiazide (HYDRODIURIL) 25 MG tablet        Allergies  Allergen Reactions  . Almond (Diagnostic) Shortness Of Breath  . Asa [Aspirin] Shortness Of Breath  and Other (See Comments)    On high doses of ASA  . Coconut Flavor Shortness Of Breath  . Neomycin Shortness Of Breath  . Peanut-Containing Drug Products Shortness Of Breath  . Penicillins Shortness Of Breath and Other (See Comments)    Has patient had a PCN reaction causing immediate rash, facial/tongue/throat swelling, SOB or lightheadedness with hypotension: Yes Has patient had a PCN reaction causing severe rash involving mucus membranes or skin necrosis: No Has patient had a PCN reaction that required hospitalization No Has patient had a PCN reaction occurring within the last 10 years: No If all of the above answers are "NO", then may proceed with  Cephalosporin use.   . Sulfa Antibiotics Shortness Of Breath  . Tizanidine Shortness Of Breath  . Lisinopril Cough  . Keflex [Cephalexin] Rash    Contact information for follow-up providers    [name], MD. Schedule an appointment as soon as possible for a visit in 2 week(s).   Specialty:  Internal Medicine Why:  After being discharged from the skilled nursing facility Contact information: [place] [id number] [phone]            Contact information for after-discharge care    Destination    [facility] Follow up.   Specialty:  [place] information: [address] [place] [phone]                  The results of significant diagnostics from this hospitalization (including imaging, microbiology, ancillary and laboratory) are listed below for reference.    Significant Diagnostic Studies: Dg Thoracic Spine 2 View  Result Date: 10/05/2016 CLINICAL DATA:  Thoracic spine pain after fall today. EXAM: THORACIC SPINE 2 VIEWS COMPARISON:  CT scan of June 11, 2013. FINDINGS: No fracture or spondylolisthesis is noted. Degenerative disc disease is noted in the mid and lower thoracic spine. Atherosclerosis of thoracic aorta is noted. IMPRESSION: Multilevel degenerative disc disease. No acute abnormality seen in the thoracic spine. Aortic atherosclerosis. Electronically Signed   By: Marijo Conception, M.D.   On: 10/05/2016 10:34   Mr Pelvis Wo Contrast  Result Date: 10/05/2016 CLINICAL DATA:  Tripped at home landing on left side. Left pelvic area tender to touch. EXAM: MRI PELVIS WITHOUT CONTRAST TECHNIQUE: Multiplanar multisequence MR imaging of the pelvis was performed. No intravenous contrast was administered. COMPARISON:  Pelvis and left hip radiographs from 10/05/2016 FINDINGS: Urinary Tract:  No abnormality visualized. Bowel:  Unremarkable  visualized pelvic bowel loops. Vascular/Lymphatic: No pathologically enlarged lymph nodes. No significant vascular abnormality seen. Reproductive:  No mass or other significant abnormality Other: Soft tissue edema overlying the posterior and lateral aspect of both hips and thighs is identified left greater than right consistent with soft tissue contusions. Musculoskeletal: The visualized lumbar spine from L3 through S1 demonstrates disc space narrowing with disc desiccation. There is grade 1 anterolisthesis of L5 on S1. Small disc-osteophyte complexes are noted of the visualized lumbar spine. Small focus of marrow edema with transverse linear hypointensity consistent with a nondisplaced fracture of the S4 segment of the sacrum is noted, series 9, image 30, series 5, image 3, and series 3, image 3. No additional pelvic fracture is identified. Joint space narrowing is noted of both hips with small physiologic amount of joint fluid present bilaterally. No fracture of the proximal femora. No intramuscular hemorrhage nor atrophy. IMPRESSION: 1. Acute nondisplaced fracture of the S4 segment  of the sacrum. 2. Soft tissue contusions along the posterior and lateral aspect of the thighs and hips left-greater-than-right. 3. No acute fracture of either hip nor dislocation. Mild joint space narrowing of the hips consistent with osteoarthritis. 4. Lumbar spondylosis with grade 1 spondylolisthesis of L5 on S1. Electronically Signed   By: Ashley Royalty M.D.   On: 10/05/2016 17:49   Dg Hip Unilat W Or Wo Pelvis 2-3 Views Left  Result Date: 10/05/2016 CLINICAL DATA:  Status post fall EXAM: DG HIP (WITH OR WITHOUT PELVIS) 2-3V LEFT COMPARISON:  None. FINDINGS: There is no evidence of hip fracture or dislocation. There is no evidence of arthropathy or other focal bone abnormality. Lumbar spine spondylosis. IMPRESSION: No acute osseous injury of the left hip. Electronically Signed   By: Kathreen Devoid   On: 10/05/2016 10:57    Microbiology: No results found for this or any previous visit (from the past 240 hour(s)).   Labs: Basic Metabolic Panel:  Recent Labs Lab 10/05/16 1144 10/05/16 1820 10/06/16 0702 10/07/16 0741 10/08/16 0433  NA 114* 115* 120* 122* 123*  K 3.2* 3.1* 3.9 3.9 4.0  CL 81* 85* 90* 93* 94*  CO2 25 23 21* 23 22  GLUCOSE 97 102* 73 96 94  BUN 12 11 11 13 17   CREATININE 0.84 0.69 0.68 0.83 0.88  CALCIUM 8.2* 7.6* 8.0* 8.0* 7.6*  MG  --   --  1.8 1.7  --    CBC:  Recent Labs Lab 10/05/16 1144 10/05/16 1820 10/06/16 0702 10/07/16 0741  WBC 6.9  --  7.7 8.9  NEUTROABS 5.0  --   --   --   HGB 7.7* 6.6* 10.2* 10.5*  HCT 21.9* 18.8* 30.1* 30.6*  MCV 71.6*  --  74.0* 73.9*  PLT 303  --  284 293    Signed:  [name] MD.  [facility] 10/08/2016, 1:11 PM

## 2016-10-08 NOTE — Clinical Social Work Placement (Signed)
   CLINICAL SOCIAL WORK PLACEMENT  NOTE  Date:  10/08/2016  Patient Details  Name: Jasmin Lee MRN: [id number] Date of Birth: [birth year]  Clinical Social Work is seeking post-discharge placement for this patient at the [place] level of care (*CSW will initial, date and re-position this form in  chart as items are completed):  Yes   Patient/family provided with [place] Work Department's list of facilities offering this level of care within the geographic area requested by the patient (or if unable, by the patient's family).  Yes   Patient/family informed of their freedom to choose among providers that offer the needed level of care, that participate in Medicare, Medicaid or managed care program needed by the patient, have an available bed and are willing to accept the patient.  Yes   Patient/family informed of [facility]'s ownership interest in [facility] and [facility], as well as of the fact that they are under no obligation to receive care at these facilities.  PASRR submitted to EDS on 10/06/16     PASRR number received on 10/06/16     Existing PASRR number confirmed on       FL2 transmitted to all facilities in geographic area requested by pt/family on 10/06/16     FL2 transmitted to all facilities within larger geographic area on       Patient informed that his/her managed care company has contracts with or will negotiate with certain facilities, including the following:        Yes   Patient/family informed of bed offers received.  Patient chooses bed at [facility]     Physician recommends and patient chooses bed at      Patient to be transferred to [facility] on 10/08/16.  Patient to be transferred to facility by [facility]     Patient family notified on 10/08/16 of transfer.  Name of family member notified:  [name]     PHYSICIAN       Additional Comment:     _______________________________________________ Benard Halsted, [place] 10/08/2016, 1:36 PM

## 2016-10-12 ENCOUNTER — Non-Acute Institutional Stay (SKILLED_NURSING_FACILITY): Payer: Medicare HMO | Admitting: Adult Health

## 2016-10-12 ENCOUNTER — Encounter: Payer: Self-pay | Admitting: Adult Health

## 2016-10-12 DIAGNOSIS — I10 Essential (primary) hypertension: Secondary | ICD-10-CM | POA: Diagnosis not present

## 2016-10-12 DIAGNOSIS — E871 Hypo-osmolality and hyponatremia: Secondary | ICD-10-CM

## 2016-10-12 DIAGNOSIS — S3210XA Unspecified fracture of sacrum, initial encounter for closed fracture: Secondary | ICD-10-CM | POA: Diagnosis not present

## 2016-10-12 DIAGNOSIS — J4540 Moderate persistent asthma, uncomplicated: Secondary | ICD-10-CM

## 2016-10-12 DIAGNOSIS — J449 Chronic obstructive pulmonary disease, unspecified: Secondary | ICD-10-CM | POA: Diagnosis not present

## 2016-10-12 DIAGNOSIS — D631 Anemia in chronic kidney disease: Secondary | ICD-10-CM | POA: Diagnosis not present

## 2016-10-12 DIAGNOSIS — E876 Hypokalemia: Secondary | ICD-10-CM | POA: Diagnosis not present

## 2016-10-12 DIAGNOSIS — M25552 Pain in left hip: Secondary | ICD-10-CM | POA: Diagnosis not present

## 2016-10-12 DIAGNOSIS — R5381 Other malaise: Secondary | ICD-10-CM

## 2016-10-12 DIAGNOSIS — E785 Hyperlipidemia, unspecified: Secondary | ICD-10-CM

## 2016-10-12 DIAGNOSIS — N189 Chronic kidney disease, unspecified: Secondary | ICD-10-CM

## 2016-10-12 NOTE — Progress Notes (Signed)
Location:   [facility] Room Number: 107 A Place of Service:  SNF (31)   CODE STATUS: DNR  Allergies  Allergen Reactions  . Almond (Diagnostic) Shortness Of Breath  . Asa [Aspirin] Shortness Of Breath and Other (See Comments)    On high doses of ASA  . Coconut Flavor Shortness Of Breath  . Neomycin Shortness Of Breath  . Peanut-Containing Drug Products Shortness Of Breath  . Penicillins Shortness Of Breath and Other (See Comments)    Has patient had a PCN reaction causing immediate rash, facial/tongue/throat swelling, SOB or lightheadedness with hypotension: Yes Has patient had a PCN reaction causing severe rash involving mucus membranes or skin necrosis: No Has patient had a PCN reaction that required hospitalization No Has patient had a PCN reaction occurring within the last 10 years: No If all of the above answers are "NO", then may proceed with Cephalosporin use.   . Sulfa Antibiotics Shortness Of Breath  . Tizanidine Shortness Of Breath  . Lisinopril Cough  . Keflex [Cephalexin] Rash    Chief Complaint  Patient presents with  . Hospitalization Follow-up    Hospital follow up    HPI:  She has been hospitalized following falls at home with left hip pain and and S4 fracture; anemia requiring 2 units PBCS; severe hypokalemia and hyponatremia. She is here for short term rehab; with her goal to return back home. I am not certain at this time if going home alone will be an option; she may do better in an assisted living environment.    Past Medical History:  Diagnosis Date  . Anemia   . Arthritis    "in my legs and feet" (05/11/2014)  . Cardiomyopathy ([place])   . Chronic bronchitis ([place]) "2-3 times/yr"  . Complication of anesthesia    "started flinching at start of hysterectomy; they had to give me more anesthetic"  . COPD with asthma ([place])   . Dyslipidemia   . Echocardiogram abnormal 12/05/2010   Moderate concentric left ventricular hypertrophy EF > 03%  stage I diastolic dysfunction, left atrium mild to moderate dilatation, moderate mitral annular calcification with trace MR. Trace TR.  Marland Kitchen HTN (hypertension)   . Pneumonia "several times"  . Squamous carcinoma    "left face and thigh"  . Uterine cancer [facility])     Past Surgical History:  Procedure Laterality Date  . APPENDECTOMY  1954  . BREAST BIOPSY Left    "it was nothing"  . CARDIAC EVENT MONITOR  12/05/10-01/02/11   SINUS RHYTHM  . CATARACT EXTRACTION W/ INTRAOCULAR LENS  IMPLANT, BILATERAL Bilateral   . CHOLECYSTECTOMY  2003  . DILATION AND CURETTAGE OF UTERUS  1966  . SQUAMOUS CELL CARCINOMA EXCISION Left    face and thigh  . TRANSTHORACIC ECHOCARDIOGRAM  December 05, 2010   moderate cocentric LVH WITH STAGE 1 IMPARIED DIASTOLIC DYSFUNCTION , NORMAL EF AND NORMAL LV PRESSURES. LEFT ATRIUM WAS ONLY MILD TO MODERATELY DILATED .MOD MITRAL CALCIFICATION WITH ONLY MILD REGURGITATION.,OTHERWISE RELATIVELY NORMAL  . VAGINAL HYSTERECTOMY  1966    Social History   Social History  . Marital status: Married    Spouse name: N/A  . Number of children: N/A  . Years of education: N/A   Occupational History  . Not on file.   Social History Main Topics  . Smoking status: Never Smoker  . Smokeless tobacco: Never Used  . Alcohol use No  . Drug use: No  . Sexual activity: No   Other  Topics Concern  . Not on file   Social History Narrative  . No narrative on file   Family History  Problem Relation Age of Onset  . Heart attack Mother   . Cancer Father   . Heart attack Maternal Grandmother   . Heart disease Son   . Asthma Daughter       VITAL SIGNS BP (!) 146/60   Pulse 84   Temp 97.9 F (36.6 C)   Resp 18   Ht 5\' 2"  (1.575 m)   Wt 116 lb (52.6 kg)   SpO2 98%   BMI 21.22 kg/m   Patient's Medications  New Prescriptions   No medications on file  Previous Medications   ACETAMINOPHEN (TYLENOL) 500 MG TABLET    Take 1,000 mg by mouth every 6 (six) hours as needed for  mild pain, moderate pain, fever or headache.    ALBUTEROL (PROVENTIL HFA;VENTOLIN HFA) 108 (90 BASE) MCG/ACT INHALER    Inhale 2 puffs into the lungs every 6 (six) hours as needed for wheezing or shortness of breath.   ALBUTEROL (PROVENTIL) (2.5 MG/3ML) 0.083% NEBULIZER SOLUTION    Take 2.5 mg by nebulization every 8 (eight) hours as needed for wheezing or shortness of breath.    ATORVASTATIN (LIPITOR) 10 MG TABLET    Take 10 mg by mouth every other day.    CHOLECALCIFEROL 1000 UNITS TABLET    Take 1,000 Units by mouth daily.   CYANOCOBALAMIN 1000 MCG TABLET    Take 1,000 mcg by mouth daily with breakfast.   DICLOFENAC SODIUM (VOLTAREN) 1 % GEL    Apply 1 application topically daily.    DILT-XR 120 MG 24 HR CAPSULE    TAKE 1 CAPSULE DAILY   FLUTICASONE (FLONASE) 50 MCG/ACT NASAL SPRAY    Place 2 sprays into both nostrils at bedtime.    FLUTICASONE-SALMETEROL (ADVAIR) 250-50 MCG/DOSE AEPB    Inhale 1 puff into the lungs 2 (two) times daily.   HYDRALAZINE (APRESOLINE) 10 MG TABLET    Take 1 tablet (10 mg total) by mouth every 6 (six) hours.   LOSARTAN (COZAAR) 50 MG TABLET    Take 3 tablets (150 mg total) by mouth daily.   MENTHOL, TOPICAL ANALGESIC, (ICY HOT EX)    Apply 1 application topically as needed (for pain).   MONTELUKAST (SINGULAIR) 10 MG TABLET    Take 10 mg by mouth at bedtime.    MULTIPLE VITAMIN (MULTIVITAMIN WITH MINERALS) TABS TABLET    Take 1 tablet by mouth daily with breakfast.    PANTOPRAZOLE (PROTONIX) 40 MG TABLET    Take 1 tablet (40 mg total) by mouth daily.   POLYETHYLENE GLYCOL (MIRALAX / GLYCOLAX) PACKET    Take 17 g by mouth daily as needed for mild constipation.   POLYVINYL ALCOHOL (ARTIFICIAL TEARS) 1.4 % OPHTHALMIC SOLUTION    Place 1 drop into both eyes 3 (three) times daily.   SKIN PROTECTANTS, MISC. (CALAZIME SKIN PROTECTANT EX)    Apply topically to Buttocks two times daily for wound care   TRAMADOL (ULTRAM) 50 MG TABLET    Take 1 tablet (50 mg total) by mouth  every 6 (six) hours as needed for moderate pain.   VITAMIN C (ASCORBIC ACID) 250 MG TABLET    Take 250 mg by mouth daily.   WOUND CLEANSERS (WOUND CLEANSER EX)    Cleanse left elbow daily  Modified Medications   No medications on file  Discontinued Medications   CHOLECALCIFEROL (VITAMIN D PO)  Take 1 tablet by mouth daily.   CYANOCOBALAMIN (VITAMIN B-12 PO)    Take 1 tablet by mouth daily with breakfast.      SIGNIFICANT DIAGNOSTIC EXAMS  10-05-16: left hip x-ray: No acute osseous injury of the left hip.  10-05-16: thoracic spine x-ray: Multilevel degenerative disc disease. No acute abnormality seen in the thoracic spine. Aortic atherosclerosis.   10-05-16: mri of pelvis: IMPRESSION: 1. Acute nondisplaced fracture of the S4 segment of the sacrum. 2. Soft tissue contusions along the posterior and lateral aspect of the thighs and hips left-greater-than-right. 3. No acute fracture of either hip nor dislocation. Mild joint space narrowing of the hips consistent with osteoarthritis. 4. Lumbar spondylosis with grade 1 spondylolisthesis of L5 on S1.   LABS REVIEWED:   10-05-16: wbc 6.9; hgb 7.7; hct 21.9; mcv 71.6; plt 303; glucose 97; bun 12; creat 0.84; k+ 3.2; n++ 114; ca  8.2; vit B 12: 3358; folate 32.4; iron 15; tibc 332' ferritin 70 10-06-16: wbc 7.7; hgb 10.2; hct 30.1; mcv 74.0; plt 284; glucose 73; bun 11; creat 0.68; k+ 3.9; na++ 120;  ca 8.0;  10-07-16: wbc 8.9; hgb 10.5; hct 30.6; mcv 73.9; plt 293; glucose 96; bun 13; creat 0.83; k+ 3.9; na++ 122; ca 8.0; mag 1.7 10-08-16: glucose 94; bun 17; creat 0.88; k+ 4.0; na++ 123; ca 7.6    Review of Systems  Constitutional: Negative for malaise/fatigue.  Respiratory: Negative for cough and shortness of breath.   Cardiovascular: Negative for chest pain, palpitations and leg swelling.  Gastrointestinal: Negative for abdominal pain, constipation and heartburn.  Musculoskeletal: Positive for joint pain. Negative for back pain and myalgias.        Has left hip pain   Skin: Negative.   Neurological: Negative for dizziness.  Psychiatric/Behavioral: The patient is not nervous/anxious.    Physical Exam  Constitutional: She is oriented to person, place, and time. No distress.  Frail   Eyes: Conjunctivae are normal.  Neck: Neck supple. No JVD present. No thyromegaly present.  Cardiovascular: Normal rate, regular rhythm, normal heart sounds and intact distal pulses.   Respiratory: Effort normal. No respiratory distress. She has no wheezes.  Breath sounds diminished   GI: Soft. Bowel sounds are normal. She exhibits no distension. There is no tenderness.  Musculoskeletal: She exhibits no edema.  Able to move all extremities   Lymphadenopathy:    She has no cervical adenopathy.  Neurological: She is alert and oriented to person, place, and time.  Skin: Skin is warm and dry. She is not diaphoretic.  Has multiple skin tears and bruises on arms   Psychiatric: She has a normal mood and affect.     ASSESSMENT/ PLAN:  1. Hypertension: b/p 146/60: will continue apresoline 10 mg every 6 hours;  cozaar 50 mg daily; dilt XR 120 mg daily   2. Dyslipidemia: will continue lipitor 10 mg every other day   3. Gerd: will continue protonix 40 mg daily   4. Anemia due to chronic renal disease: hgb 10.5; will monitor   5. COPD; has extrinsic asthma: will continue albuterol 2 puffs every 6 hours as needed and has neb every 8 hours as needed; advair 250/50 twice daily singulair 10 mg daily flonase daily   6. Constipation: will continue miralax daily as needed  7. Left hip pain/physical deconditioning: will continue therapy as directed to improve upon her level of independence with her adls; will continue ultram 50 mg every 6 hours as needed. Will use biofreeze  three times daily as needed to left hip  8. Hyponatremia/hypokalemia: k+ 4.0; na++ 123 will monitor    Will check cbc;   cmp  Time spent with patient  50  minutes >50% time spent  counseling; reviewing medical record; tests; labs; and developing future plan of care   MD is aware of resident's narcotic use and is in agreement with current plan of care. We will attempt to wean resident as apropriate   Ok Edwards NP [facility]  Contact [phone] Monday through Friday 8am- 5pm  After hours call [phone]

## 2016-10-13 ENCOUNTER — Encounter: Payer: Self-pay | Admitting: Internal Medicine

## 2016-10-13 ENCOUNTER — Non-Acute Institutional Stay (SKILLED_NURSING_FACILITY): Payer: Medicare HMO | Admitting: Internal Medicine

## 2016-10-13 DIAGNOSIS — J4540 Moderate persistent asthma, uncomplicated: Secondary | ICD-10-CM

## 2016-10-13 DIAGNOSIS — N189 Chronic kidney disease, unspecified: Secondary | ICD-10-CM

## 2016-10-13 DIAGNOSIS — T7840XA Allergy, unspecified, initial encounter: Secondary | ICD-10-CM

## 2016-10-13 DIAGNOSIS — I10 Essential (primary) hypertension: Secondary | ICD-10-CM | POA: Diagnosis not present

## 2016-10-13 DIAGNOSIS — I429 Cardiomyopathy, unspecified: Secondary | ICD-10-CM | POA: Diagnosis not present

## 2016-10-13 DIAGNOSIS — S3210XA Unspecified fracture of sacrum, initial encounter for closed fracture: Secondary | ICD-10-CM | POA: Diagnosis not present

## 2016-10-13 DIAGNOSIS — E878 Other disorders of electrolyte and fluid balance, not elsewhere classified: Secondary | ICD-10-CM

## 2016-10-13 DIAGNOSIS — R5381 Other malaise: Secondary | ICD-10-CM | POA: Diagnosis not present

## 2016-10-13 DIAGNOSIS — J449 Chronic obstructive pulmonary disease, unspecified: Secondary | ICD-10-CM

## 2016-10-13 DIAGNOSIS — D631 Anemia in chronic kidney disease: Secondary | ICD-10-CM

## 2016-10-13 DIAGNOSIS — E785 Hyperlipidemia, unspecified: Secondary | ICD-10-CM

## 2016-10-13 NOTE — Progress Notes (Signed)
Patient ID: Jasmin Lee, female   DOB: 1923/01/16, 83 y.o.   MRN: [id number]    HISTORY AND PHYSICAL   DATE: 10/13/2016  Location:    [facility] Room Number: 107 A Place of Service: SNF (31)   Extended Emergency Contact Information Primary Emergency Contact: Laverdure,John "J" Address: [address] [place] Phone: [phone] Relation: Spouse Secondary Emergency Contact: Pendegraph,Julie  [place] Phone: [phone] Relation: Friend  Advanced Directive information Does Patient Have a Medical Advance Directive?: No, Does patient want to make changes to medical advance directive?: No - Patient declined  Chief Complaint  Patient presents with  . New Admit To SNF    Admission    HPI:  83 yo female seen today as a new admission into SNF following hospital stay for left hip pain s/p fall, sacral fx, deconditioning, hyponatremia and hypokalemia, anemia with hx AOCD, HTN, COPD/chronic bronchitis, hx uterine CA, hx cardiomyopathy, hyperlipidemia. She presented to ED with left hip pain s/p fall when she tripped while walking from tiled floor to carpet. Left hip/pelvis MRI (+) sacral fx but no hip fx. She was given 2 units PRBCs 2/2 low Hgb. Electrolytes corrected with IVF and K+. HCTZ stopped. Ortho curbside consult obtained but no sx recommended. Hgb dropped 7.7-->6.6-->10.5; iron 10; ferritin 70; retic count 17.2; Cr 0.88; Na 114-->123; K dropped 3.1-->4; Mg 1.8-->1.7 at d/c. She presents to SNF for short term rehab.  Today she reports c/a breathing. She occasionally gets chest tightness and SOB due to asthma. She usually keeps HFA on her nightstand and in her purse. She states PCP had written for her to use HFA prn with no time frames. No CP. No palpitations. Pain controlled with prn tramadol and topical biofreeze. No sacral pain. She is tolerating therapy. She has redness and itching at edges of  dressing applied to contusion of b/l elbow area. She lives alone at home. Spouse died earlier this month.  Hypertension - stable on apresoline 10 mg every 6 hours;  cozaar 50 mg daily; diltiazem XR 120 mg daily. HCTZ stopped in hospital   Dyslipidemia - stable on lipitor 10 mg every other day   GERD - stable on protonix 40 mg daily   Anemia due to chronic renal disease - stable. Hgb 10.5. She is s/p 2 units PRBCs  COPD with moderate persistent extrinsic asthma - stable on albuterol 2 puffs every 6 hours as needed; albuterol neb every 8 hours as needed; advair 250/50 twice daily; singulair 10 mg daily; flonase daily   Constipation - stable on miralax daily as needed   Past Medical History:  Diagnosis Date  . Anemia   . Arthritis    "in my legs and feet" (05/11/2014)  . Cardiomyopathy ([place])   . Chronic bronchitis ([place]) "2-3 times/yr"  . Complication of anesthesia    "started flinching at start of hysterectomy; they had to give me more anesthetic"  . COPD with asthma ([place])   . Dyslipidemia   . Echocardiogram abnormal 12/05/2010   Moderate concentric left ventricular hypertrophy EF > 71% stage I diastolic dysfunction, left atrium mild to moderate dilatation, moderate mitral annular calcification with trace MR. Trace TR.  Marland Kitchen HTN (hypertension)   . Pneumonia "several times"  . Squamous carcinoma    "left face and thigh"  . Uterine cancer [facility])     Past Surgical History:  Procedure  Laterality Date  . APPENDECTOMY  1954  . BREAST BIOPSY Left    "it was nothing"  . CARDIAC EVENT MONITOR  12/05/10-01/02/11   SINUS RHYTHM  . CATARACT EXTRACTION W/ INTRAOCULAR LENS  IMPLANT, BILATERAL Bilateral   . CHOLECYSTECTOMY  2003  . DILATION AND CURETTAGE OF UTERUS  1966  . SQUAMOUS CELL CARCINOMA EXCISION Left    face and thigh  . TRANSTHORACIC ECHOCARDIOGRAM  December 05, 2010   moderate cocentric LVH WITH STAGE 1 IMPARIED DIASTOLIC DYSFUNCTION , NORMAL EF AND NORMAL LV PRESSURES. LEFT ATRIUM  WAS ONLY MILD TO MODERATELY DILATED .MOD MITRAL CALCIFICATION WITH ONLY MILD REGURGITATION.,OTHERWISE RELATIVELY NORMAL  . VAGINAL HYSTERECTOMY  1966    Patient Care Team: [name], MD as PCP - General (Internal Medicine)  Social History   Social History  . Marital status: Married    Spouse name: N/A  . Number of children: N/A  . Years of education: N/A   Occupational History  . Not on file.   Social History Main Topics  . Smoking status: Never Smoker  . Smokeless tobacco: Never Used  . Alcohol use No  . Drug use: No  . Sexual activity: No   Other Topics Concern  . Not on file   Social History Narrative  . No narrative on file     reports that she has never smoked. She has never used smokeless tobacco. She reports that she does not drink alcohol or use drugs.  Family History  Problem Relation Age of Onset  . Heart attack Mother   . Cancer Father   . Heart attack Maternal Grandmother   . Heart disease Son   . Asthma Daughter    Family Status  Relation Status  . Mother Deceased  . Father Deceased  . Sister Alive  . MGM Deceased  . MGF Deceased  . PGM Deceased  . PGF Deceased  . Son Alive  . Son Alive       agent orange  . Daughter Alive    Immunization History  Administered Date(s) Administered  . Influenza-Unspecified 03/25/2014  . Tdap 06/30/2016    Allergies  Allergen Reactions  . Almond (Diagnostic) Shortness Of Breath  . Asa [Aspirin] Shortness Of Breath and Other (See Comments)    On high doses of ASA  . Coconut Flavor Shortness Of Breath  . Neomycin Shortness Of Breath  . Peanut-Containing Drug Products Shortness Of Breath  . Penicillins Shortness Of Breath and Other (See Comments)    Has patient had a PCN reaction causing immediate rash, facial/tongue/throat swelling, SOB or lightheadedness with hypotension: Yes Has patient had a PCN reaction causing severe rash involving mucus membranes or skin necrosis: No Has patient had a  PCN reaction that required hospitalization No Has patient had a PCN reaction occurring within the last 10 years: No If all of the above answers are "NO", then may proceed with Cephalosporin use.   . Sulfa Antibiotics Shortness Of Breath  . Tizanidine Shortness Of Breath  . Lisinopril Cough  . Keflex [Cephalexin] Rash    Medications: Patient's Medications  New Prescriptions   No medications on file  Previous Medications   ACETAMINOPHEN (TYLENOL) 500 MG TABLET    Take 1,000 mg by mouth every 6 (six) hours as needed for mild pain, moderate pain, fever or headache.    ALBUTEROL (PROVENTIL HFA;VENTOLIN HFA) 108 (90 BASE) MCG/ACT INHALER    Inhale 2 puffs into the lungs every 6 (six) hours as needed for wheezing  or shortness of breath.   ALBUTEROL (PROVENTIL) (2.5 MG/3ML) 0.083% NEBULIZER SOLUTION    Take 2.5 mg by nebulization every 8 (eight) hours as needed for wheezing or shortness of breath.    ATORVASTATIN (LIPITOR) 10 MG TABLET    Take 10 mg by mouth every other day.    CHOLECALCIFEROL 1000 UNITS TABLET    Take 1,000 Units by mouth daily.   CYANOCOBALAMIN 1000 MCG TABLET    Take 1,000 mcg by mouth daily with breakfast.   DICLOFENAC SODIUM (VOLTAREN) 1 % GEL    Apply 1 application topically daily.    DILT-XR 120 MG 24 HR CAPSULE    TAKE 1 CAPSULE DAILY   FLUTICASONE (FLONASE) 50 MCG/ACT NASAL SPRAY    Place 2 sprays into both nostrils at bedtime.    FLUTICASONE-SALMETEROL (ADVAIR) 250-50 MCG/DOSE AEPB    Inhale 1 puff into the lungs 2 (two) times daily.   HYDRALAZINE (APRESOLINE) 10 MG TABLET    Take 1 tablet (10 mg total) by mouth every 6 (six) hours.   LOSARTAN (COZAAR) 50 MG TABLET    Take 3 tablets (150 mg total) by mouth daily.   MENTHOL, TOPICAL ANALGESIC, (ICY HOT EX)    Apply 1 application topically as needed (for pain).   MONTELUKAST (SINGULAIR) 10 MG TABLET    Take 10 mg by mouth at bedtime.    MULTIPLE VITAMIN (MULTIVITAMIN WITH MINERALS) TABS TABLET    Take 1 tablet by mouth  daily with breakfast.    PANTOPRAZOLE (PROTONIX) 40 MG TABLET    Take 1 tablet (40 mg total) by mouth daily.   POLYETHYLENE GLYCOL (MIRALAX / GLYCOLAX) PACKET    Take 17 g by mouth daily as needed for mild constipation.   POLYVINYL ALCOHOL (ARTIFICIAL TEARS) 1.4 % OPHTHALMIC SOLUTION    Place 1 drop into both eyes 3 (three) times daily.   SKIN PROTECTANTS, MISC. (CALAZIME SKIN PROTECTANT EX)    Apply topically to Buttocks two times daily for wound care   TRAMADOL (ULTRAM) 50 MG TABLET    Take 1 tablet (50 mg total) by mouth every 6 (six) hours as needed for moderate pain.   VITAMIN C (ASCORBIC ACID) 250 MG TABLET    Take 250 mg by mouth daily.   WOUND CLEANSERS (WOUND CLEANSER EX)    Cleanse left elbow daily  Modified Medications   No medications on file  Discontinued Medications   No medications on file    Review of Systems  Respiratory: Positive for chest tightness and shortness of breath.   Musculoskeletal: Positive for arthralgias and gait problem.  All other systems reviewed and are negative.   Vitals:   10/13/16 0859  BP: (!) 146/60  Pulse: 84  Resp: 18  Temp: 97.9 F (36.6 C)  TempSrc: Oral  SpO2: 98%  Weight: 116 lb (52.6 kg)  Height: 5' 2"  (1.575 m)   Body mass index is 21.22 kg/m.  Physical Exam  Constitutional: She is oriented to person, place, and time. She appears well-developed.  Frail appearing in NAD, sitting on edge of bed  HENT:  Mouth/Throat: Oropharynx is clear and moist. No oropharyngeal exudate.  Eyes: Pupils are equal, round, and reactive to light. No scleral icterus.  Neck: Neck supple. Carotid bruit is not present. No tracheal deviation present. No thyromegaly present.  Cardiovascular: Normal rate, regular rhythm and intact distal pulses.  Exam reveals no gallop and no friction rub.   Murmur (1/6 SEM) heard. Trace LE edema b/l. No calf  TTP  Pulmonary/Chest: Effort normal. No stridor. No respiratory distress. She has no wheezes. She has no rales.  She exhibits no tenderness.  Reduced BA at base b/l  Abdominal: Soft. Bowel sounds are normal. She exhibits no distension and no mass. There is no hepatomegaly. There is no tenderness. There is no rebound and no guarding.  Musculoskeletal: She exhibits edema, tenderness and deformity (mild kyphosis).  Lymphadenopathy:    She has no cervical adenopathy.  Neurological: She is alert and oriented to person, place, and time.  Skin: Skin is warm and dry. No rash noted. There is erythema (at edges of b/l dressing on right antecubital and left elbow. no vesicular formation or purulent d/c;).  Contusion left elbow and right antecubital fossa, left lateral thigh  Psychiatric: She has a normal mood and affect. Her behavior is normal. Judgment and thought content normal.     Labs reviewed: Admission on 10/05/2016, Discharged on 10/08/2016  Component Date Value Ref Range Status  . WBC 10/05/2016 6.9  4.0 - 10.5 K/uL Final  . RBC 10/05/2016 3.06* 3.87 - 5.11 MIL/uL Final  . Hemoglobin 10/05/2016 7.7* 12.0 - 15.0 g/dL Final  . HCT 10/05/2016 21.9* 36.0 - 46.0 % Final  . MCV 10/05/2016 71.6* 78.0 - 100.0 fL Final  . MCH 10/05/2016 25.2* 26.0 - 34.0 pg Final  . MCHC 10/05/2016 35.2  30.0 - 36.0 g/dL Final  . RDW 10/05/2016 15.1  11.5 - 15.5 % Final  . Platelets 10/05/2016 303  150 - 400 K/uL Final  . Neutrophils Relative % 10/05/2016 72  % Final  . Neutro Abs 10/05/2016 5.0  1.7 - 7.7 K/uL Final  . Lymphocytes Relative 10/05/2016 21  % Final  . Lymphs Abs 10/05/2016 1.4  0.7 - 4.0 K/uL Final  . Monocytes Relative 10/05/2016 6  % Final  . Monocytes Absolute 10/05/2016 0.4  0.1 - 1.0 K/uL Final  . Eosinophils Relative 10/05/2016 1  % Final  . Eosinophils Absolute 10/05/2016 0.1  0.0 - 0.7 K/uL Final  . Basophils Relative 10/05/2016 0  % Final  . Basophils Absolute 10/05/2016 0.0  0.0 - 0.1 K/uL Final  . Sodium 10/05/2016 114* 135 - 145 mmol/L Final   Comment: REPEATED TO VERIFY CRITICAL RESULT  CALLED TO, READ BACK BY AND VERIFIED WITH: J.NARON,RN 1251 10/05/16 CLARK,S   . Potassium 10/05/2016 3.2* 3.5 - 5.1 mmol/L Final  . Chloride 10/05/2016 81* 101 - 111 mmol/L Final  . CO2 10/05/2016 25  22 - 32 mmol/L Final  . Glucose, Bld 10/05/2016 97  65 - 99 mg/dL Final  . BUN 10/05/2016 12  6 - 20 mg/dL Final  . Creatinine, Ser 10/05/2016 0.84  0.44 - 1.00 mg/dL Final  . Calcium 10/05/2016 8.2* 8.9 - 10.3 mg/dL Final  . GFR calc non Af Amer 10/05/2016 59* >60 mL/min Final  . GFR calc Af Amer 10/05/2016 >60  >60 mL/min Final   Comment: (NOTE) The eGFR has been calculated using the CKD EPI equation. This calculation has not been validated in all clinical situations. eGFR's persistently <60 mL/min signify possible Chronic Kidney Disease.   . Anion gap 10/05/2016 8  5 - 15 Final  . Color, Urine 10/05/2016 YELLOW  YELLOW Final  . APPearance 10/05/2016 CLOUDY* CLEAR Final  . Specific Gravity, Urine 10/05/2016 1.010  1.005 - 1.030 Final  . pH 10/05/2016 7.0  5.0 - 8.0 Final  . Glucose, UA 10/05/2016 NEGATIVE  NEGATIVE mg/dL Final  . Hgb urine dipstick 10/05/2016 SMALL*  NEGATIVE Final  . Bilirubin Urine 10/05/2016 NEGATIVE  NEGATIVE Final  . Ketones, ur 10/05/2016 NEGATIVE  NEGATIVE mg/dL Final  . Protein, ur 10/05/2016 30* NEGATIVE mg/dL Final  . Nitrite 10/05/2016 NEGATIVE  NEGATIVE Final  . Leukocytes, UA 10/05/2016 SMALL* NEGATIVE Final  . RBC / HPF 10/05/2016 0-5  0 - 5 RBC/hpf Final  . WBC, UA 10/05/2016 0-5  0 - 5 WBC/hpf Final  . Bacteria, UA 10/05/2016 MANY* NONE SEEN Final  . Squamous Epithelial / LPF 10/05/2016 0-5* NONE SEEN Final  . ABO/RH(D) 10/05/2016 O NEG   Final  . Antibody Screen 10/05/2016 NEG   Final  . Sample Expiration 10/05/2016 10/08/2016   Final  . Unit Number 10/05/2016 [id number]   Final  . Blood Component Type 10/05/2016 RBC, LR IRR   Final  . Unit division 10/05/2016 00   Final  . Status of Unit 10/05/2016 ISSUED,FINAL   Final  . Transfusion Status  10/05/2016 OK TO TRANSFUSE   Final  . Crossmatch Result 10/05/2016 Compatible   Final  . Unit Number 10/05/2016 [id number]   Final  . Blood Component Type 10/05/2016 RBC, LR IRR   Final  . Unit division 10/05/2016 00   Final  . Status of Unit 10/05/2016 ISSUED,FINAL   Final  . Transfusion Status 10/05/2016 OK TO TRANSFUSE   Final  . Crossmatch Result 10/05/2016 Compatible   Final  . Fecal Occult Bld 10/05/2016 NEGATIVE  NEGATIVE Final  . ABO/RH(D) 10/05/2016 O NEG   Final  . Vitamin B-12 10/05/2016 3358* 180 - 914 pg/mL Final   Comment: (NOTE) This assay is not validated for testing neonatal or myeloproliferative syndrome specimens for Vitamin B12 levels.   . Folate 10/05/2016 32.4  >5.9 ng/mL Final  . Iron 10/05/2016 15* 28 - 170 ug/dL Final  . TIBC 10/05/2016 332  250 - 450 ug/dL Final  . Saturation Ratios 10/05/2016 5* 10.4 - 31.8 % Final  . UIBC 10/05/2016 317  ug/dL Final  . Ferritin 10/05/2016 70  11 - 307 ng/mL Final  . Retic Ct Pct 10/05/2016 0.6  0.4 - 3.1 % Final  . RBC. 10/05/2016 2.87* 3.87 - 5.11 MIL/uL Final  . Retic Count, Manual 10/05/2016 17.2* 19.0 - 186.0 K/uL Final  . Hemoglobin 10/05/2016 6.6* 12.0 - 15.0 g/dL Final   Comment: REPEATED TO VERIFY CRITICAL RESULT CALLED TO, READ BACK BY AND VERIFIED WITH: C PRICE,RN 1840 10/05/16 D BRADLEY   . HCT 10/05/2016 18.8* 36.0 - 46.0 % Final  . Sodium 10/06/2016 120* 135 - 145 mmol/L Final  . Potassium 10/06/2016 3.9  3.5 - 5.1 mmol/L Final  . Chloride 10/06/2016 90* 101 - 111 mmol/L Final  . CO2 10/06/2016 21* 22 - 32 mmol/L Final  . Glucose, Bld 10/06/2016 73  65 - 99 mg/dL Final  . BUN 10/06/2016 11  6 - 20 mg/dL Final  . Creatinine, Ser 10/06/2016 0.68  0.44 - 1.00 mg/dL Final  . Calcium 10/06/2016 8.0* 8.9 - 10.3 mg/dL Final  . GFR calc non Af Amer 10/06/2016 >60  >60 mL/min Final  . GFR calc Af Amer 10/06/2016 >60  >60 mL/min Final   Comment: (NOTE) The eGFR has been calculated using the CKD EPI  equation. This calculation has not been validated in all clinical situations. eGFR's persistently <60 mL/min signify possible Chronic Kidney Disease.   . Anion gap 10/06/2016 9  5 - 15 Final  . WBC 10/06/2016 7.7  4.0 - 10.5 K/uL Final  . RBC 10/06/2016  4.07  3.87 - 5.11 MIL/uL Final  . Hemoglobin 10/06/2016 10.2* 12.0 - 15.0 g/dL Final   POST TRANSFUSION SPECIMEN  . HCT 10/06/2016 30.1* 36.0 - 46.0 % Final  . MCV 10/06/2016 74.0* 78.0 - 100.0 fL Final   POST TRANSFUSION SPECIMEN  . MCH 10/06/2016 25.1* 26.0 - 34.0 pg Final  . MCHC 10/06/2016 33.9  30.0 - 36.0 g/dL Final  . RDW 10/06/2016 14.7  11.5 - 15.5 % Final   POST TRANSFUSION SPECIMEN  . Platelets 10/06/2016 284  150 - 400 K/uL Final  . Magnesium 10/06/2016 1.8  1.7 - 2.4 mg/dL Final  . Sodium 10/05/2016 115* 135 - 145 mmol/L Final   Comment: CRITICAL RESULT CALLED TO, READ BACK BY AND VERIFIED WITH: K PRICE,RN 1857 10/05/2016 WBOND   . Potassium 10/05/2016 3.1* 3.5 - 5.1 mmol/L Final  . Chloride 10/05/2016 85* 101 - 111 mmol/L Final  . CO2 10/05/2016 23  22 - 32 mmol/L Final  . Glucose, Bld 10/05/2016 102* 65 - 99 mg/dL Final  . BUN 10/05/2016 11  6 - 20 mg/dL Final  . Creatinine, Ser 10/05/2016 0.69  0.44 - 1.00 mg/dL Final  . Calcium 10/05/2016 7.6* 8.9 - 10.3 mg/dL Final  . GFR calc non Af Amer 10/05/2016 >60  >60 mL/min Final  . GFR calc Af Amer 10/05/2016 >60  >60 mL/min Final   Comment: (NOTE) The eGFR has been calculated using the CKD EPI equation. This calculation has not been validated in all clinical situations. eGFR's persistently <60 mL/min signify possible Chronic Kidney Disease.   . Anion gap 10/05/2016 7  5 - 15 Final  . Order Confirmation 10/05/2016 ORDER PROCESSED BY BLOOD BANK   Final  . ISSUE DATE / TIME 10/05/2016 [id number]   Final  . Blood Product Unit Number 10/05/2016 [id number]   Final  . PRODUCT CODE 10/05/2016 [id number]   Final  . Unit Type and Rh 10/05/2016 9500   Final  . Blood  Product Expiration Date 10/05/2016 [id number]   Final  . ISSUE DATE / TIME 10/05/2016 [id number]   Final  . Blood Product Unit Number 10/05/2016 [id number]   Final  . PRODUCT CODE 10/05/2016 [id number]   Final  . Unit Type and Rh 10/05/2016 9500   Final  . Blood Product Expiration Date 10/05/2016 [id number]   Final  . Sodium 10/07/2016 122* 135 - 145 mmol/L Final  . Potassium 10/07/2016 3.9  3.5 - 5.1 mmol/L Final  . Chloride 10/07/2016 93* 101 - 111 mmol/L Final  . CO2 10/07/2016 23  22 - 32 mmol/L Final  . Glucose, Bld 10/07/2016 96  65 - 99 mg/dL Final  . BUN 10/07/2016 13  6 - 20 mg/dL Final  . Creatinine, Ser 10/07/2016 0.83  0.44 - 1.00 mg/dL Final  . Calcium 10/07/2016 8.0* 8.9 - 10.3 mg/dL Final  . GFR calc non Af Amer 10/07/2016 59* >60 mL/min Final  . GFR calc Af Amer 10/07/2016 >60  >60 mL/min Final   Comment: (NOTE) The eGFR has been calculated using the CKD EPI equation. This calculation has not been validated in all clinical situations. eGFR's persistently <60 mL/min signify possible Chronic Kidney Disease.   . Anion gap 10/07/2016 6  5 - 15 Final  . WBC 10/07/2016 8.9  4.0 - 10.5 K/uL Final  . RBC 10/07/2016 4.14  3.87 - 5.11 MIL/uL Final  . Hemoglobin 10/07/2016 10.5* 12.0 - 15.0 g/dL Final  . HCT 10/07/2016 30.6* 36.0 - 46.0 % Final  .  MCV 10/07/2016 73.9* 78.0 - 100.0 fL Final  . MCH 10/07/2016 25.4* 26.0 - 34.0 pg Final  . MCHC 10/07/2016 34.3  30.0 - 36.0 g/dL Final  . RDW 10/07/2016 15.2  11.5 - 15.5 % Final  . Platelets 10/07/2016 293  150 - 400 K/uL Final  . Magnesium 10/07/2016 1.7  1.7 - 2.4 mg/dL Final  . Sodium 10/08/2016 123* 135 - 145 mmol/L Final  . Potassium 10/08/2016 4.0  3.5 - 5.1 mmol/L Final  . Chloride 10/08/2016 94* 101 - 111 mmol/L Final  . CO2 10/08/2016 22  22 - 32 mmol/L Final  . Glucose, Bld 10/08/2016 94  65 - 99 mg/dL Final  . BUN 10/08/2016 17  6 - 20 mg/dL Final  . Creatinine, Ser 10/08/2016 0.88  0.44 - 1.00 mg/dL Final  .  Calcium 10/08/2016 7.6* 8.9 - 10.3 mg/dL Final  . GFR calc non Af Amer 10/08/2016 55* >60 mL/min Final  . GFR calc Af Amer 10/08/2016 >60  >60 mL/min Final   Comment: (NOTE) The eGFR has been calculated using the CKD EPI equation. This calculation has not been validated in all clinical situations. eGFR's persistently <60 mL/min signify possible Chronic Kidney Disease.   . Anion gap 10/08/2016 7  5 - 15 Final    Dg Thoracic Spine 2 View  Result Date: 10/05/2016 CLINICAL DATA:  Thoracic spine pain after fall today. EXAM: THORACIC SPINE 2 VIEWS COMPARISON:  CT scan of June 11, 2013. FINDINGS: No fracture or spondylolisthesis is noted. Degenerative disc disease is noted in the mid and lower thoracic spine. Atherosclerosis of thoracic aorta is noted. IMPRESSION: Multilevel degenerative disc disease. No acute abnormality seen in the thoracic spine. Aortic atherosclerosis. Electronically Signed   By: Marijo Conception, M.D.   On: 10/05/2016 10:34   Mr Pelvis Wo Contrast  Result Date: 10/05/2016 CLINICAL DATA:  Tripped at home landing on left side. Left pelvic area tender to touch. EXAM: MRI PELVIS WITHOUT CONTRAST TECHNIQUE: Multiplanar multisequence MR imaging of the pelvis was performed. No intravenous contrast was administered. COMPARISON:  Pelvis and left hip radiographs from 10/05/2016 FINDINGS: Urinary Tract:  No abnormality visualized. Bowel:  Unremarkable visualized pelvic bowel loops. Vascular/Lymphatic: No pathologically enlarged lymph nodes. No significant vascular abnormality seen. Reproductive:  No mass or other significant abnormality Other: Soft tissue edema overlying the posterior and lateral aspect of both hips and thighs is identified left greater than right consistent with soft tissue contusions. Musculoskeletal: The visualized lumbar spine from L3 through S1 demonstrates disc space narrowing with disc desiccation. There is grade 1 anterolisthesis of L5 on S1. Small disc-osteophyte  complexes are noted of the visualized lumbar spine. Small focus of marrow edema with transverse linear hypointensity consistent with a nondisplaced fracture of the S4 segment of the sacrum is noted, series 9, image 30, series 5, image 3, and series 3, image 3. No additional pelvic fracture is identified. Joint space narrowing is noted of both hips with small physiologic amount of joint fluid present bilaterally. No fracture of the proximal femora. No intramuscular hemorrhage nor atrophy. IMPRESSION: 1. Acute nondisplaced fracture of the S4 segment of the sacrum. 2. Soft tissue contusions along the posterior and lateral aspect of the thighs and hips left-greater-than-right. 3. No acute fracture of either hip nor dislocation. Mild joint space narrowing of the hips consistent with osteoarthritis. 4. Lumbar spondylosis with grade 1 spondylolisthesis of L5 on S1. Electronically Signed   By: Ashley Royalty M.D.   On: 10/05/2016 17:49  Dg Hip Unilat W Or Wo Pelvis 2-3 Views Left  Result Date: 10/05/2016 CLINICAL DATA:  Status post fall EXAM: DG HIP (WITH OR WITHOUT PELVIS) 2-3V LEFT COMPARISON:  None. FINDINGS: There is no evidence of hip fracture or dislocation. There is no evidence of arthropathy or other focal bone abnormality. Lumbar spine spondylosis. IMPRESSION: No acute osseous injury of the left hip. Electronically Signed   By: Kathreen Devoid   On: 10/05/2016 10:57     Assessment/Plan   ICD-10-CM   1. Moderate persistent extrinsic asthma without complication K99.83   2. Chronic obstructive pulmonary disease, unspecified COPD type ([place]) J44.9   3. Essential hypertension I10   4. Electrolyte disturbance E87.8   5. Closed fracture of sacrum, unspecified portion of sacrum, initial encounter ([place]) S32.10XA   6. Physical deconditioning R53.81   7. Anemia due to chronic kidney disease N18.9    D63.1   8. Dyslipidemia E78.5   9. Cardiomyopathy, unspecified type ([place]) I42.9   10. Hypersensitivity reaction,  initial encounter T78.40XA    to adhesive     Discussed at length proper usage of HFA and frequency. She has agreed to use [facility] q4hr prn. May keep at bedside  Check CMP, CBC and Mg level   Wound care as ordered - s/w facility wound care nurse about changing dressing type due to local reaction likely due to adhesive  Cont other meds as ordered  PT/OT/ST as ordered  F/u with specialists as indicated  GOAL: short term rehab and d/c home when medically appropriate. Communicated with pt and nursing.  Will follow  Leighanne Adolph S. Perlie Gold  [facility] [address] [place], [place] [address] [phone] Cell (Monday-Friday 8 AM - 5 PM) [phone] After 5 PM and follow prompts

## 2016-10-15 ENCOUNTER — Telehealth: Payer: Self-pay | Admitting: Cardiology

## 2016-10-15 DIAGNOSIS — M25552 Pain in left hip: Secondary | ICD-10-CM | POA: Diagnosis not present

## 2016-10-15 DIAGNOSIS — Z79899 Other long term (current) drug therapy: Secondary | ICD-10-CM

## 2016-10-15 NOTE — Telephone Encounter (Signed)
Spoke with pt she states that the swelling in her BLE is increased she states that her medication that she was taking at home she is not taking at the current facility. I have reviewed her past medications at home and current med list from facility notes, I believe the medication that she is talking about is her HCTZ 25mg , as I do not see it on the current medication list. Pt states the BLE have increased swelling, denies any additional SOB(she states that she often get SOB on exertion-this is not new) and she states that when she goes to bed she puts her BLE elevated on a pillow and this helps and the swelling goes down quite a bit but does not go away. No CP. No palpitations  Ok to re-start HCTZ, or? Please advise

## 2016-10-15 NOTE — Telephone Encounter (Signed)
Patient calling, states that she would like to verify which medications she can take. Patient will have physical therapy so if she not available she asks we leave a detailed message. Thanks.

## 2016-10-20 ENCOUNTER — Encounter: Payer: Self-pay | Admitting: Internal Medicine

## 2016-10-20 ENCOUNTER — Non-Acute Institutional Stay (SKILLED_NURSING_FACILITY): Payer: Medicare HMO | Admitting: Internal Medicine

## 2016-10-20 DIAGNOSIS — I429 Cardiomyopathy, unspecified: Secondary | ICD-10-CM

## 2016-10-20 DIAGNOSIS — J4540 Moderate persistent asthma, uncomplicated: Secondary | ICD-10-CM | POA: Diagnosis not present

## 2016-10-20 DIAGNOSIS — M25552 Pain in left hip: Secondary | ICD-10-CM | POA: Diagnosis not present

## 2016-10-20 DIAGNOSIS — I10 Essential (primary) hypertension: Secondary | ICD-10-CM | POA: Diagnosis not present

## 2016-10-20 DIAGNOSIS — R5381 Other malaise: Secondary | ICD-10-CM | POA: Diagnosis not present

## 2016-10-20 DIAGNOSIS — M2550 Pain in unspecified joint: Secondary | ICD-10-CM

## 2016-10-20 DIAGNOSIS — S3210XA Unspecified fracture of sacrum, initial encounter for closed fracture: Secondary | ICD-10-CM | POA: Diagnosis not present

## 2016-10-20 DIAGNOSIS — N189 Chronic kidney disease, unspecified: Secondary | ICD-10-CM | POA: Diagnosis not present

## 2016-10-20 DIAGNOSIS — E785 Hyperlipidemia, unspecified: Secondary | ICD-10-CM

## 2016-10-20 DIAGNOSIS — D631 Anemia in chronic kidney disease: Secondary | ICD-10-CM | POA: Diagnosis not present

## 2016-10-20 NOTE — Progress Notes (Signed)
Patient ID: Jasmin Lee, female   DOB: 28-Sep-1922, 83 y.o.   MRN: [id number]    DATE:  10/20/2016  Location:    [facility] Room Number: 107 A Place of Service: SNF (31)   Extended Emergency Contact Information Primary Emergency Contact: Savas,John "J" Address: [address] [place] Phone: [phone] Relation: Spouse Secondary Emergency Contact: Pendegraph,Julie  [place] Phone: [phone] Relation: Friend  Advanced Directive information Does Patient Have a Medical Advance Directive?: No  Chief Complaint  Patient presents with  . Discharge Note    Discharge    HPI:  83 yo female seen today for d/c from SNF following completion of short term rehab. She will d/c home with [facility] PT/OT (gait training and balance transfers), CNA (ADL assistance), Nursing (medication management). She requires DME: rolling walker with skis/basket, tub bench, reacher.  She has a caregiver who will be caring for her at home. No concerns today. Pain controlled. No falls. No nursing issues.  BRIEF SUMMARY OF HOSPITAL/SNF STAY: new admission into SNF following hospital stay for left hip pain s/p fall, sacral fx, deconditioning, hyponatremia and hypokalemia, anemia with hx AOCD, HTN, COPD/chronic bronchitis, hx uterine CA, hx cardiomyopathy, hyperlipidemia. She presented to ED with left hip pain s/p fall when she tripped while walking from tiled floor to carpet. Left hip/pelvis MRI (+) sacral fx but no hip fx. She was given 2 units PRBCs 2/2 low Hgb. Electrolytes corrected with IVF and K+. HCTZ stopped. Ortho curbside consult obtained but no sx recommended. Hgb dropped 7.7-->6.6-->10.5; iron 10; ferritin 70; retic count 17.2; Cr 0.88; Na 114-->123; K dropped 3.1-->4; Mg 1.8-->1.7 at d/c. While at SNF, proper HFA use discussed. She had a local reaction to adhesive applied to contusion on elbow. Pain controlled with  biofreeze, voltaren gel and prn tramadol.  Of note, she lives alone at home. Spouse died in early Oct 21, 2016.  Hypertension - stable on apresoline 10 mg every 6 hours;  cozaar 50 mg daily; diltiazem XR 120 mg daily. HCTZ stopped in hospital   Dyslipidemia - stable on lipitor 10 mg every other day   GERD - stable on protonix 40 mg daily   Anemia due to chronic renal disease - stable. Hgb 10.5. She is s/p 2 units PRBCs. She takes B12 supplement  COPD with moderate persistent extrinsic asthma - stable on albuterol 2 puffs every 6 hours as needed; albuterol neb every 8 hours as needed; advair 250/50 twice daily; singulair 10 mg daily; flonase daily   Constipation - stable on miralax daily as needed    Past Medical History:  Diagnosis Date  . Anemia   . Arthritis    "in my legs and feet" (05/11/2014)  . Cardiomyopathy ([place])   . Chronic bronchitis ([place]) "2-3 times/yr"  . Complication of anesthesia    "started flinching at start of hysterectomy; they had to give me more anesthetic"  . COPD with asthma ([place])   . Dyslipidemia   . Echocardiogram abnormal 12/05/2010   Moderate concentric left ventricular hypertrophy EF > 32% stage I diastolic dysfunction, left atrium mild to moderate dilatation, moderate mitral annular calcification with trace MR. Trace TR.  Marland Kitchen HTN (hypertension)   . Pneumonia "several times"  . Squamous carcinoma    "left face and thigh"  . Uterine cancer [facility])     Past Surgical History:  Procedure Laterality Date  .  APPENDECTOMY  1954  . BREAST BIOPSY Left    "it was nothing"  . CARDIAC EVENT MONITOR  12/05/10-01/02/11   SINUS RHYTHM  . CATARACT EXTRACTION W/ INTRAOCULAR LENS  IMPLANT, BILATERAL Bilateral   . CHOLECYSTECTOMY  2003  . DILATION AND CURETTAGE OF UTERUS  1966  . SQUAMOUS CELL CARCINOMA EXCISION Left    face and thigh  . TRANSTHORACIC ECHOCARDIOGRAM  December 05, 2010   moderate cocentric LVH WITH STAGE 1 IMPARIED DIASTOLIC DYSFUNCTION , NORMAL EF AND  NORMAL LV PRESSURES. LEFT ATRIUM WAS ONLY MILD TO MODERATELY DILATED .MOD MITRAL CALCIFICATION WITH ONLY MILD REGURGITATION.,OTHERWISE RELATIVELY NORMAL  . VAGINAL HYSTERECTOMY  1966    Patient Care Team: [name], MD as PCP - General (Internal Medicine)  Social History   Social History  . Marital status: Married    Spouse name: N/A  . Number of children: N/A  . Years of education: N/A   Occupational History  . Not on file.   Social History Main Topics  . Smoking status: Never Smoker  . Smokeless tobacco: Never Used  . Alcohol use No  . Drug use: No  . Sexual activity: No   Other Topics Concern  . Not on file   Social History Narrative  . No narrative on file     reports that she has never smoked. She has never used smokeless tobacco. She reports that she does not drink alcohol or use drugs.  Family History  Problem Relation Age of Onset  . Heart attack Mother   . Cancer Father   . Heart attack Maternal Grandmother   . Heart disease Son   . Asthma Daughter    Family Status  Relation Status  . Mother Deceased  . Father Deceased  . Sister Alive  . MGM Deceased  . MGF Deceased  . PGM Deceased  . PGF Deceased  . Son Alive  . Son Alive       agent orange  . Daughter Alive    Immunization History  Administered Date(s) Administered  . Influenza-Unspecified 03/25/2014  . Tdap 06/30/2016    Allergies  Allergen Reactions  . Almond (Diagnostic) Shortness Of Breath  . Asa [Aspirin] Shortness Of Breath and Other (See Comments)    On high doses of ASA  . Coconut Flavor Shortness Of Breath  . Neomycin Shortness Of Breath  . Peanut-Containing Drug Products Shortness Of Breath  . Penicillins Shortness Of Breath and Other (See Comments)    Has patient had a PCN reaction causing immediate rash, facial/tongue/throat swelling, SOB or lightheadedness with hypotension: Yes Has patient had a PCN reaction causing severe rash involving mucus membranes or skin  necrosis: No Has patient had a PCN reaction that required hospitalization No Has patient had a PCN reaction occurring within the last 10 years: No If all of the above answers are "NO", then may proceed with Cephalosporin use.   . Sulfa Antibiotics Shortness Of Breath  . Tizanidine Shortness Of Breath  . Lisinopril Cough  . Keflex [Cephalexin] Rash    Medications: Patient's Medications  New Prescriptions   No medications on file  Previous Medications   ACETAMINOPHEN (TYLENOL) 500 MG TABLET    Take 1,000 mg by mouth 3 (three) times daily.    ALBUTEROL (PROVENTIL HFA;VENTOLIN HFA) 108 (90 BASE) MCG/ACT INHALER    Inhale 2 puffs into the lungs every 6 (six) hours as needed for wheezing or shortness of breath.   ALBUTEROL (PROVENTIL) (2.5 MG/3ML) 0.083% NEBULIZER SOLUTION  Take 2.5 mg by nebulization every 8 (eight) hours as needed for wheezing or shortness of breath.    ATORVASTATIN (LIPITOR) 10 MG TABLET    Take 10 mg by mouth every other day.    CHOLECALCIFEROL 1000 UNITS TABLET    Take 1,000 Units by mouth daily.   CYANOCOBALAMIN 1000 MCG TABLET    Take 1,000 mcg by mouth daily with breakfast.   DICLOFENAC SODIUM (VOLTAREN) 1 % GEL    Apply 1 application topically daily.    DILT-XR 120 MG 24 HR CAPSULE    TAKE 1 CAPSULE DAILY   FLUTICASONE (FLONASE) 50 MCG/ACT NASAL SPRAY    Place 2 sprays into both nostrils at bedtime.    FLUTICASONE-SALMETEROL (ADVAIR) 250-50 MCG/DOSE AEPB    Inhale 1 puff into the lungs 2 (two) times daily.   HYDRALAZINE (APRESOLINE) 10 MG TABLET    Take 1 tablet (10 mg total) by mouth every 6 (six) hours.   LOSARTAN (COZAAR) 50 MG TABLET    Take 3 tablets (150 mg total) by mouth daily.   MENTHOL, TOPICAL ANALGESIC, (BIOFREEZE) 4 % GEL    Apply 1 application topically 3 (three) times daily.   MONTELUKAST (SINGULAIR) 10 MG TABLET    Take 10 mg by mouth at bedtime.    MULTIPLE VITAMIN (MULTIVITAMIN WITH MINERALS) TABS TABLET    Take 1 tablet by mouth daily with  breakfast.    PANTOPRAZOLE (PROTONIX) 40 MG TABLET    Take 1 tablet (40 mg total) by mouth daily.   POLYETHYLENE GLYCOL (MIRALAX / GLYCOLAX) PACKET    Take 17 g by mouth daily as needed for mild constipation.   POLYVINYL ALCOHOL (ARTIFICIAL TEARS) 1.4 % OPHTHALMIC SOLUTION    Place 1 drop into both eyes 3 (three) times daily.   SKIN PROTECTANTS, MISC. (CALAZIME SKIN PROTECTANT EX)    Apply topically to Buttocks two times daily for wound care   TRAMADOL (ULTRAM) 50 MG TABLET    Take 1 tablet (50 mg total) by mouth every 6 (six) hours as needed for moderate pain.   VITAMIN C (ASCORBIC ACID) 250 MG TABLET    Take 250 mg by mouth daily.   WOUND CLEANSERS (WOUND CLEANSER EX)    Cleanse left elbow daily  Modified Medications   No medications on file  Discontinued Medications   MENTHOL, TOPICAL ANALGESIC, (ICY HOT EX)    Apply 1 application topically as needed (for pain).    Review of Systems  Respiratory: Positive for shortness of breath. Negative for chest tightness.   Musculoskeletal: Positive for arthralgias and gait problem.  All other systems reviewed and are negative.   Vitals:   10/20/16 1431  BP: (!) 150/74  Pulse: 74  Resp: 20  Temp: 98.1 F (36.7 C)  TempSrc: Oral  SpO2: 97%  Weight: 116 lb (52.6 kg)  Height: 5' 2"  (1.575 m)   Body mass index is 21.22 kg/m.  Physical Exam  Constitutional: She is oriented to person, place, and time. She appears well-developed and well-nourished.  Neurological: She is alert and oriented to person, place, and time.  Skin: Skin is warm and dry. No rash noted.  Psychiatric: She has a normal mood and affect. Her behavior is normal. Judgment and thought content normal.     Labs reviewed: Admission on 10/05/2016, Discharged on 10/08/2016  Component Date Value Ref Range Status  . WBC 10/05/2016 6.9  4.0 - 10.5 K/uL Final  . RBC 10/05/2016 3.06* 3.87 - 5.11 MIL/uL Final  .  Hemoglobin 10/05/2016 7.7* 12.0 - 15.0 g/dL Final  . HCT 10/05/2016  21.9* 36.0 - 46.0 % Final  . MCV 10/05/2016 71.6* 78.0 - 100.0 fL Final  . MCH 10/05/2016 25.2* 26.0 - 34.0 pg Final  . MCHC 10/05/2016 35.2  30.0 - 36.0 g/dL Final  . RDW 10/05/2016 15.1  11.5 - 15.5 % Final  . Platelets 10/05/2016 303  150 - 400 K/uL Final  . Neutrophils Relative % 10/05/2016 72  % Final  . Neutro Abs 10/05/2016 5.0  1.7 - 7.7 K/uL Final  . Lymphocytes Relative 10/05/2016 21  % Final  . Lymphs Abs 10/05/2016 1.4  0.7 - 4.0 K/uL Final  . Monocytes Relative 10/05/2016 6  % Final  . Monocytes Absolute 10/05/2016 0.4  0.1 - 1.0 K/uL Final  . Eosinophils Relative 10/05/2016 1  % Final  . Eosinophils Absolute 10/05/2016 0.1  0.0 - 0.7 K/uL Final  . Basophils Relative 10/05/2016 0  % Final  . Basophils Absolute 10/05/2016 0.0  0.0 - 0.1 K/uL Final  . Sodium 10/05/2016 114* 135 - 145 mmol/L Final   Comment: REPEATED TO VERIFY CRITICAL RESULT CALLED TO, READ BACK BY AND VERIFIED WITH: J.NARON,RN [id number] 10/05/16 CLARK,S   . Potassium 10/05/2016 3.2* 3.5 - 5.1 mmol/L Final  . Chloride 10/05/2016 81* 101 - 111 mmol/L Final  . CO2 10/05/2016 25  22 - 32 mmol/L Final  . Glucose, Bld 10/05/2016 97  65 - 99 mg/dL Final  . BUN 10/05/2016 12  6 - 20 mg/dL Final  . Creatinine, Ser 10/05/2016 0.84  0.44 - 1.00 mg/dL Final  . Calcium 10/05/2016 8.2* 8.9 - 10.3 mg/dL Final  . GFR calc non Af Amer 10/05/2016 59* >60 mL/min Final  . GFR calc Af Amer 10/05/2016 >60  >60 mL/min Final   Comment: (NOTE) The eGFR has been calculated using the CKD EPI equation. This calculation has not been validated in all clinical situations. eGFR's persistently <60 mL/min signify possible Chronic Kidney Disease.   . Anion gap 10/05/2016 8  5 - 15 Final  . Color, Urine 10/05/2016 YELLOW  YELLOW Final  . APPearance 10/05/2016 CLOUDY* CLEAR Final  . Specific Gravity, Urine 10/05/2016 1.010  1.005 - 1.030 Final  . pH 10/05/2016 7.0  5.0 - 8.0 Final  . Glucose, UA 10/05/2016 NEGATIVE  NEGATIVE mg/dL Final    . Hgb urine dipstick 10/05/2016 SMALL* NEGATIVE Final  . Bilirubin Urine 10/05/2016 NEGATIVE  NEGATIVE Final  . Ketones, ur 10/05/2016 NEGATIVE  NEGATIVE mg/dL Final  . Protein, ur 10/05/2016 30* NEGATIVE mg/dL Final  . Nitrite 10/05/2016 NEGATIVE  NEGATIVE Final  . Leukocytes, UA 10/05/2016 SMALL* NEGATIVE Final  . RBC / HPF 10/05/2016 0-5  0 - 5 RBC/hpf Final  . WBC, UA 10/05/2016 0-5  0 - 5 WBC/hpf Final  . Bacteria, UA 10/05/2016 MANY* NONE SEEN Final  . Squamous Epithelial / LPF 10/05/2016 0-5* NONE SEEN Final  . ABO/RH(D) 10/05/2016 O NEG   Final  . Antibody Screen 10/05/2016 NEG   Final  . Sample Expiration 10/05/2016 10/08/2016   Final  . Unit Number 10/05/2016 [id number]   Final  . Blood Component Type 10/05/2016 RBC, LR IRR   Final  . Unit division 10/05/2016 00   Final  . Status of Unit 10/05/2016 ISSUED,FINAL   Final  . Transfusion Status 10/05/2016 OK TO TRANSFUSE   Final  . Crossmatch Result 10/05/2016 Compatible   Final  . Unit Number 10/05/2016 [id number]   Final  .  Blood Component Type 10/05/2016 RBC, LR IRR   Final  . Unit division 10/05/2016 00   Final  . Status of Unit 10/05/2016 ISSUED,FINAL   Final  . Transfusion Status 10/05/2016 OK TO TRANSFUSE   Final  . Crossmatch Result 10/05/2016 Compatible   Final  . Fecal Occult Bld 10/05/2016 NEGATIVE  NEGATIVE Final  . ABO/RH(D) 10/05/2016 O NEG   Final  . Vitamin B-12 10/05/2016 3358* 180 - 914 pg/mL Final   Comment: (NOTE) This assay is not validated for testing neonatal or myeloproliferative syndrome specimens for Vitamin B12 levels.   . Folate 10/05/2016 32.4  >5.9 ng/mL Final  . Iron 10/05/2016 15* 28 - 170 ug/dL Final  . TIBC 10/05/2016 332  250 - 450 ug/dL Final  . Saturation Ratios 10/05/2016 5* 10.4 - 31.8 % Final  . UIBC 10/05/2016 317  ug/dL Final  . Ferritin 10/05/2016 70  11 - 307 ng/mL Final  . Retic Ct Pct 10/05/2016 0.6  0.4 - 3.1 % Final  . RBC. 10/05/2016 2.87* 3.87 - 5.11 MIL/uL Final   . Retic Count, Manual 10/05/2016 17.2* 19.0 - 186.0 K/uL Final  . Hemoglobin 10/05/2016 6.6* 12.0 - 15.0 g/dL Final   Comment: REPEATED TO VERIFY CRITICAL RESULT CALLED TO, READ BACK BY AND VERIFIED WITH: C PRICE,RN 1840 10/05/16 D BRADLEY   . HCT 10/05/2016 18.8* 36.0 - 46.0 % Final  . Sodium 10/06/2016 120* 135 - 145 mmol/L Final  . Potassium 10/06/2016 3.9  3.5 - 5.1 mmol/L Final  . Chloride 10/06/2016 90* 101 - 111 mmol/L Final  . CO2 10/06/2016 21* 22 - 32 mmol/L Final  . Glucose, Bld 10/06/2016 73  65 - 99 mg/dL Final  . BUN 10/06/2016 11  6 - 20 mg/dL Final  . Creatinine, Ser 10/06/2016 0.68  0.44 - 1.00 mg/dL Final  . Calcium 10/06/2016 8.0* 8.9 - 10.3 mg/dL Final  . GFR calc non Af Amer 10/06/2016 >60  >60 mL/min Final  . GFR calc Af Amer 10/06/2016 >60  >60 mL/min Final   Comment: (NOTE) The eGFR has been calculated using the CKD EPI equation. This calculation has not been validated in all clinical situations. eGFR's persistently <60 mL/min signify possible Chronic Kidney Disease.   . Anion gap 10/06/2016 9  5 - 15 Final  . WBC 10/06/2016 7.7  4.0 - 10.5 K/uL Final  . RBC 10/06/2016 4.07  3.87 - 5.11 MIL/uL Final  . Hemoglobin 10/06/2016 10.2* 12.0 - 15.0 g/dL Final   POST TRANSFUSION SPECIMEN  . HCT 10/06/2016 30.1* 36.0 - 46.0 % Final  . MCV 10/06/2016 74.0* 78.0 - 100.0 fL Final   POST TRANSFUSION SPECIMEN  . MCH 10/06/2016 25.1* 26.0 - 34.0 pg Final  . MCHC 10/06/2016 33.9  30.0 - 36.0 g/dL Final  . RDW 10/06/2016 14.7  11.5 - 15.5 % Final   POST TRANSFUSION SPECIMEN  . Platelets 10/06/2016 284  150 - 400 K/uL Final  . Magnesium 10/06/2016 1.8  1.7 - 2.4 mg/dL Final  . Sodium 10/05/2016 115* 135 - 145 mmol/L Final   Comment: CRITICAL RESULT CALLED TO, READ BACK BY AND VERIFIED WITH: K PRICE,RN 1857 10/05/2016 WBOND   . Potassium 10/05/2016 3.1* 3.5 - 5.1 mmol/L Final  . Chloride 10/05/2016 85* 101 - 111 mmol/L Final  . CO2 10/05/2016 23  22 - 32 mmol/L Final   . Glucose, Bld 10/05/2016 102* 65 - 99 mg/dL Final  . BUN 10/05/2016 11  6 - 20 mg/dL Final  .  Creatinine, Ser 10/05/2016 0.69  0.44 - 1.00 mg/dL Final  . Calcium 10/05/2016 7.6* 8.9 - 10.3 mg/dL Final  . GFR calc non Af Amer 10/05/2016 >60  >60 mL/min Final  . GFR calc Af Amer 10/05/2016 >60  >60 mL/min Final   Comment: (NOTE) The eGFR has been calculated using the CKD EPI equation. This calculation has not been validated in all clinical situations. eGFR's persistently <60 mL/min signify possible Chronic Kidney Disease.   . Anion gap 10/05/2016 7  5 - 15 Final  . Order Confirmation 10/05/2016 ORDER PROCESSED BY BLOOD BANK   Final  . ISSUE DATE / TIME 10/05/2016 [id number]   Final  . Blood Product Unit Number 10/05/2016 [id number]   Final  . PRODUCT CODE 10/05/2016 [id number]   Final  . Unit Type and Rh 10/05/2016 9500   Final  . Blood Product Expiration Date 10/05/2016 [id number]   Final  . ISSUE DATE / TIME 10/05/2016 [id number]   Final  . Blood Product Unit Number 10/05/2016 [id number]   Final  . PRODUCT CODE 10/05/2016 [id number]   Final  . Unit Type and Rh 10/05/2016 9500   Final  . Blood Product Expiration Date 10/05/2016 [id number]   Final  . Sodium 10/07/2016 122* 135 - 145 mmol/L Final  . Potassium 10/07/2016 3.9  3.5 - 5.1 mmol/L Final  . Chloride 10/07/2016 93* 101 - 111 mmol/L Final  . CO2 10/07/2016 23  22 - 32 mmol/L Final  . Glucose, Bld 10/07/2016 96  65 - 99 mg/dL Final  . BUN 10/07/2016 13  6 - 20 mg/dL Final  . Creatinine, Ser 10/07/2016 0.83  0.44 - 1.00 mg/dL Final  . Calcium 10/07/2016 8.0* 8.9 - 10.3 mg/dL Final  . GFR calc non Af Amer 10/07/2016 59* >60 mL/min Final  . GFR calc Af Amer 10/07/2016 >60  >60 mL/min Final   Comment: (NOTE) The eGFR has been calculated using the CKD EPI equation. This calculation has not been validated in all clinical situations. eGFR's persistently <60 mL/min signify possible Chronic Kidney Disease.   . Anion  gap 10/07/2016 6  5 - 15 Final  . WBC 10/07/2016 8.9  4.0 - 10.5 K/uL Final  . RBC 10/07/2016 4.14  3.87 - 5.11 MIL/uL Final  . Hemoglobin 10/07/2016 10.5* 12.0 - 15.0 g/dL Final  . HCT 10/07/2016 30.6* 36.0 - 46.0 % Final  . MCV 10/07/2016 73.9* 78.0 - 100.0 fL Final  . MCH 10/07/2016 25.4* 26.0 - 34.0 pg Final  . MCHC 10/07/2016 34.3  30.0 - 36.0 g/dL Final  . RDW 10/07/2016 15.2  11.5 - 15.5 % Final  . Platelets 10/07/2016 293  150 - 400 K/uL Final  . Magnesium 10/07/2016 1.7  1.7 - 2.4 mg/dL Final  . Sodium 10/08/2016 123* 135 - 145 mmol/L Final  . Potassium 10/08/2016 4.0  3.5 - 5.1 mmol/L Final  . Chloride 10/08/2016 94* 101 - 111 mmol/L Final  . CO2 10/08/2016 22  22 - 32 mmol/L Final  . Glucose, Bld 10/08/2016 94  65 - 99 mg/dL Final  . BUN 10/08/2016 17  6 - 20 mg/dL Final  . Creatinine, Ser 10/08/2016 0.88  0.44 - 1.00 mg/dL Final  . Calcium 10/08/2016 7.6* 8.9 - 10.3 mg/dL Final  . GFR calc non Af Amer 10/08/2016 55* >60 mL/min Final  . GFR calc Af Amer 10/08/2016 >60  >60 mL/min Final   Comment: (NOTE) The eGFR has been calculated using the CKD EPI equation. This calculation has  not been validated in all clinical situations. eGFR's persistently <60 mL/min signify possible Chronic Kidney Disease.   . Anion gap 10/08/2016 7  5 - 15 Final    Dg Thoracic Spine 2 View  Result Date: 10/05/2016 CLINICAL DATA:  Thoracic spine pain after fall today. EXAM: THORACIC SPINE 2 VIEWS COMPARISON:  CT scan of June 11, 2013. FINDINGS: No fracture or spondylolisthesis is noted. Degenerative disc disease is noted in the mid and lower thoracic spine. Atherosclerosis of thoracic aorta is noted. IMPRESSION: Multilevel degenerative disc disease. No acute abnormality seen in the thoracic spine. Aortic atherosclerosis. Electronically Signed   By: Marijo Conception, M.D.   On: 10/05/2016 10:34   Mr Pelvis Wo Contrast  Result Date: 10/05/2016 CLINICAL DATA:  Tripped at home landing on left side.  Left pelvic area tender to touch. EXAM: MRI PELVIS WITHOUT CONTRAST TECHNIQUE: Multiplanar multisequence MR imaging of the pelvis was performed. No intravenous contrast was administered. COMPARISON:  Pelvis and left hip radiographs from 10/05/2016 FINDINGS: Urinary Tract:  No abnormality visualized. Bowel:  Unremarkable visualized pelvic bowel loops. Vascular/Lymphatic: No pathologically enlarged lymph nodes. No significant vascular abnormality seen. Reproductive:  No mass or other significant abnormality Other: Soft tissue edema overlying the posterior and lateral aspect of both hips and thighs is identified left greater than right consistent with soft tissue contusions. Musculoskeletal: The visualized lumbar spine from L3 through S1 demonstrates disc space narrowing with disc desiccation. There is grade 1 anterolisthesis of L5 on S1. Small disc-osteophyte complexes are noted of the visualized lumbar spine. Small focus of marrow edema with transverse linear hypointensity consistent with a nondisplaced fracture of the S4 segment of the sacrum is noted, series 9, image 30, series 5, image 3, and series 3, image 3. No additional pelvic fracture is identified. Joint space narrowing is noted of both hips with small physiologic amount of joint fluid present bilaterally. No fracture of the proximal femora. No intramuscular hemorrhage nor atrophy. IMPRESSION: 1. Acute nondisplaced fracture of the S4 segment of the sacrum. 2. Soft tissue contusions along the posterior and lateral aspect of the thighs and hips left-greater-than-right. 3. No acute fracture of either hip nor dislocation. Mild joint space narrowing of the hips consistent with osteoarthritis. 4. Lumbar spondylosis with grade 1 spondylolisthesis of L5 on S1. Electronically Signed   By: Ashley Royalty M.D.   On: 10/05/2016 17:49   Dg Hip Unilat W Or Wo Pelvis 2-3 Views Left  Result Date: 10/05/2016 CLINICAL DATA:  Status post fall EXAM: DG HIP (WITH OR WITHOUT  PELVIS) 2-3V LEFT COMPARISON:  None. FINDINGS: There is no evidence of hip fracture or dislocation. There is no evidence of arthropathy or other focal bone abnormality. Lumbar spine spondylosis. IMPRESSION: No acute osseous injury of the left hip. Electronically Signed   By: Kathreen Devoid   On: 10/05/2016 10:57     Assessment/Plan   ICD-10-CM   1. Physical deconditioning R53.81   2. Closed fracture of sacrum, unspecified portion of sacrum, initial encounter ([place]) S32.10XA   3. Moderate persistent extrinsic asthma without complication O84.16   4. Essential hypertension I10   5. Anemia due to chronic kidney disease N18.9    D63.1   6. Dyslipidemia E78.5   7. Cardiomyopathy, unspecified type ([place]) I42.9   8. Pain in joint involving multiple sites M25.50     Patient is being discharged with home health services:   PT/OT (gait training and balance transfers), CNA (ADL assistance), Nursing (medication management)  Patient  is being discharged with the following durable medical equipment:  rolling walker with skis/basket, tub bench, reacher   Patient has been advised to f/u with their PCP in 1-2 weeks to bring them up to date on their rehab stay.  They were provided with a 30 day supply of scripts for prescription medications and refills must be obtained from their PCP. Tramadol 93m #30 take 1 tab po q6hrs prn pain with no RF given  TIME SPENT (MINUTES): 3Tiro CPerlie Gold [facility] [address] [phone] (Monday-Friday 8 AM - 5 PM) ([phone] 5 PM and follow prompts

## 2016-10-21 NOTE — Telephone Encounter (Signed)
Unfortunately, no we do not want to restart HCTZ because she was hypokalemic in the hospital. Lab prefer to do is have her take Lasix 20 mg Monday Wednesday Friday and either Saturday or Sunday. - She will need to have a chemistry panel checked within 1 week of starting.  Glenetta Hew c

## 2016-10-22 MED ORDER — FUROSEMIDE 20 MG PO TABS: 20.0000 mg | ORAL_TABLET | Freq: Every day | ORAL | 0 refills | Status: DC

## 2016-10-22 NOTE — Telephone Encounter (Signed)
Pt notified she will start medication and one week later she will come to the lab. Rx sent to mail-in, bmp ordered

## 2016-10-23 DIAGNOSIS — D631 Anemia in chronic kidney disease: Secondary | ICD-10-CM | POA: Diagnosis not present

## 2016-10-23 DIAGNOSIS — E538 Deficiency of other specified B group vitamins: Secondary | ICD-10-CM | POA: Diagnosis not present

## 2016-10-23 DIAGNOSIS — M25511 Pain in right shoulder: Secondary | ICD-10-CM | POA: Diagnosis not present

## 2016-10-23 DIAGNOSIS — I429 Cardiomyopathy, unspecified: Secondary | ICD-10-CM | POA: Diagnosis not present

## 2016-10-23 DIAGNOSIS — M25512 Pain in left shoulder: Secondary | ICD-10-CM | POA: Diagnosis not present

## 2016-10-23 DIAGNOSIS — S3210XD Unspecified fracture of sacrum, subsequent encounter for fracture with routine healing: Secondary | ICD-10-CM | POA: Diagnosis not present

## 2016-10-23 DIAGNOSIS — S50311D Abrasion of right elbow, subsequent encounter: Secondary | ICD-10-CM | POA: Diagnosis not present

## 2016-10-23 DIAGNOSIS — I129 Hypertensive chronic kidney disease with stage 1 through stage 4 chronic kidney disease, or unspecified chronic kidney disease: Secondary | ICD-10-CM | POA: Diagnosis not present

## 2016-10-23 DIAGNOSIS — I2510 Atherosclerotic heart disease of native coronary artery without angina pectoris: Secondary | ICD-10-CM | POA: Diagnosis not present

## 2016-10-23 DIAGNOSIS — N189 Chronic kidney disease, unspecified: Secondary | ICD-10-CM | POA: Diagnosis not present

## 2016-10-23 DIAGNOSIS — J449 Chronic obstructive pulmonary disease, unspecified: Secondary | ICD-10-CM | POA: Diagnosis not present

## 2016-10-23 DIAGNOSIS — E785 Hyperlipidemia, unspecified: Secondary | ICD-10-CM | POA: Diagnosis not present

## 2016-10-23 DIAGNOSIS — S50312D Abrasion of left elbow, subsequent encounter: Secondary | ICD-10-CM | POA: Diagnosis not present

## 2016-10-27 ENCOUNTER — Telehealth: Payer: Self-pay | Admitting: Cardiology

## 2016-10-27 DIAGNOSIS — S50312D Abrasion of left elbow, subsequent encounter: Secondary | ICD-10-CM | POA: Diagnosis not present

## 2016-10-27 DIAGNOSIS — I429 Cardiomyopathy, unspecified: Secondary | ICD-10-CM | POA: Diagnosis not present

## 2016-10-27 DIAGNOSIS — J449 Chronic obstructive pulmonary disease, unspecified: Secondary | ICD-10-CM | POA: Diagnosis not present

## 2016-10-27 DIAGNOSIS — I129 Hypertensive chronic kidney disease with stage 1 through stage 4 chronic kidney disease, or unspecified chronic kidney disease: Secondary | ICD-10-CM | POA: Diagnosis not present

## 2016-10-27 DIAGNOSIS — E785 Hyperlipidemia, unspecified: Secondary | ICD-10-CM | POA: Diagnosis not present

## 2016-10-27 DIAGNOSIS — S50311D Abrasion of right elbow, subsequent encounter: Secondary | ICD-10-CM | POA: Diagnosis not present

## 2016-10-27 DIAGNOSIS — S3210XD Unspecified fracture of sacrum, subsequent encounter for fracture with routine healing: Secondary | ICD-10-CM | POA: Diagnosis not present

## 2016-10-27 DIAGNOSIS — N189 Chronic kidney disease, unspecified: Secondary | ICD-10-CM | POA: Diagnosis not present

## 2016-10-27 DIAGNOSIS — I2510 Atherosclerotic heart disease of native coronary artery without angina pectoris: Secondary | ICD-10-CM | POA: Diagnosis not present

## 2016-10-27 DIAGNOSIS — D631 Anemia in chronic kidney disease: Secondary | ICD-10-CM | POA: Diagnosis not present

## 2016-10-27 NOTE — Telephone Encounter (Signed)
New message     Can she get her lab work done the morning she comes in for her appt, she doesn't have transportation to get here any other day

## 2016-10-27 NOTE — Telephone Encounter (Signed)
Returned call to patient.She stated she does not have a ride to get her lab done before her appointment with [facility] 11/12/16.Advised she can have done on same day as her appointment.

## 2016-10-28 DIAGNOSIS — I2510 Atherosclerotic heart disease of native coronary artery without angina pectoris: Secondary | ICD-10-CM | POA: Diagnosis not present

## 2016-10-28 DIAGNOSIS — S3210XD Unspecified fracture of sacrum, subsequent encounter for fracture with routine healing: Secondary | ICD-10-CM | POA: Diagnosis not present

## 2016-10-28 DIAGNOSIS — I129 Hypertensive chronic kidney disease with stage 1 through stage 4 chronic kidney disease, or unspecified chronic kidney disease: Secondary | ICD-10-CM | POA: Diagnosis not present

## 2016-10-28 DIAGNOSIS — J449 Chronic obstructive pulmonary disease, unspecified: Secondary | ICD-10-CM | POA: Diagnosis not present

## 2016-10-28 DIAGNOSIS — E785 Hyperlipidemia, unspecified: Secondary | ICD-10-CM | POA: Diagnosis not present

## 2016-10-28 DIAGNOSIS — I429 Cardiomyopathy, unspecified: Secondary | ICD-10-CM | POA: Diagnosis not present

## 2016-10-28 DIAGNOSIS — D631 Anemia in chronic kidney disease: Secondary | ICD-10-CM | POA: Diagnosis not present

## 2016-10-28 DIAGNOSIS — S50312D Abrasion of left elbow, subsequent encounter: Secondary | ICD-10-CM | POA: Diagnosis not present

## 2016-10-28 DIAGNOSIS — N189 Chronic kidney disease, unspecified: Secondary | ICD-10-CM | POA: Diagnosis not present

## 2016-10-28 DIAGNOSIS — S50311D Abrasion of right elbow, subsequent encounter: Secondary | ICD-10-CM | POA: Diagnosis not present

## 2016-10-29 DIAGNOSIS — J4520 Mild intermittent asthma, uncomplicated: Secondary | ICD-10-CM | POA: Diagnosis not present

## 2016-11-03 DIAGNOSIS — N189 Chronic kidney disease, unspecified: Secondary | ICD-10-CM | POA: Diagnosis not present

## 2016-11-03 DIAGNOSIS — I429 Cardiomyopathy, unspecified: Secondary | ICD-10-CM | POA: Diagnosis not present

## 2016-11-03 DIAGNOSIS — D631 Anemia in chronic kidney disease: Secondary | ICD-10-CM | POA: Diagnosis not present

## 2016-11-03 DIAGNOSIS — I129 Hypertensive chronic kidney disease with stage 1 through stage 4 chronic kidney disease, or unspecified chronic kidney disease: Secondary | ICD-10-CM | POA: Diagnosis not present

## 2016-11-03 DIAGNOSIS — S50311D Abrasion of right elbow, subsequent encounter: Secondary | ICD-10-CM | POA: Diagnosis not present

## 2016-11-03 DIAGNOSIS — E785 Hyperlipidemia, unspecified: Secondary | ICD-10-CM | POA: Diagnosis not present

## 2016-11-03 DIAGNOSIS — S3210XD Unspecified fracture of sacrum, subsequent encounter for fracture with routine healing: Secondary | ICD-10-CM | POA: Diagnosis not present

## 2016-11-03 DIAGNOSIS — I2510 Atherosclerotic heart disease of native coronary artery without angina pectoris: Secondary | ICD-10-CM | POA: Diagnosis not present

## 2016-11-03 DIAGNOSIS — J449 Chronic obstructive pulmonary disease, unspecified: Secondary | ICD-10-CM | POA: Diagnosis not present

## 2016-11-03 DIAGNOSIS — S50312D Abrasion of left elbow, subsequent encounter: Secondary | ICD-10-CM | POA: Diagnosis not present

## 2016-11-04 DIAGNOSIS — E785 Hyperlipidemia, unspecified: Secondary | ICD-10-CM | POA: Diagnosis not present

## 2016-11-04 DIAGNOSIS — I429 Cardiomyopathy, unspecified: Secondary | ICD-10-CM | POA: Diagnosis not present

## 2016-11-04 DIAGNOSIS — S50312D Abrasion of left elbow, subsequent encounter: Secondary | ICD-10-CM | POA: Diagnosis not present

## 2016-11-04 DIAGNOSIS — S50311D Abrasion of right elbow, subsequent encounter: Secondary | ICD-10-CM | POA: Diagnosis not present

## 2016-11-04 DIAGNOSIS — S3210XD Unspecified fracture of sacrum, subsequent encounter for fracture with routine healing: Secondary | ICD-10-CM | POA: Diagnosis not present

## 2016-11-04 DIAGNOSIS — J449 Chronic obstructive pulmonary disease, unspecified: Secondary | ICD-10-CM | POA: Diagnosis not present

## 2016-11-04 DIAGNOSIS — N189 Chronic kidney disease, unspecified: Secondary | ICD-10-CM | POA: Diagnosis not present

## 2016-11-04 DIAGNOSIS — D631 Anemia in chronic kidney disease: Secondary | ICD-10-CM | POA: Diagnosis not present

## 2016-11-04 DIAGNOSIS — I129 Hypertensive chronic kidney disease with stage 1 through stage 4 chronic kidney disease, or unspecified chronic kidney disease: Secondary | ICD-10-CM | POA: Diagnosis not present

## 2016-11-04 DIAGNOSIS — I2510 Atherosclerotic heart disease of native coronary artery without angina pectoris: Secondary | ICD-10-CM | POA: Diagnosis not present

## 2016-11-05 ENCOUNTER — Ambulatory Visit: Payer: Medicare HMO | Admitting: Podiatry

## 2016-11-06 DIAGNOSIS — S50311D Abrasion of right elbow, subsequent encounter: Secondary | ICD-10-CM | POA: Diagnosis not present

## 2016-11-06 DIAGNOSIS — S50312D Abrasion of left elbow, subsequent encounter: Secondary | ICD-10-CM | POA: Diagnosis not present

## 2016-11-06 DIAGNOSIS — I2510 Atherosclerotic heart disease of native coronary artery without angina pectoris: Secondary | ICD-10-CM | POA: Diagnosis not present

## 2016-11-06 DIAGNOSIS — E785 Hyperlipidemia, unspecified: Secondary | ICD-10-CM | POA: Diagnosis not present

## 2016-11-06 DIAGNOSIS — J449 Chronic obstructive pulmonary disease, unspecified: Secondary | ICD-10-CM | POA: Diagnosis not present

## 2016-11-06 DIAGNOSIS — N189 Chronic kidney disease, unspecified: Secondary | ICD-10-CM | POA: Diagnosis not present

## 2016-11-06 DIAGNOSIS — I129 Hypertensive chronic kidney disease with stage 1 through stage 4 chronic kidney disease, or unspecified chronic kidney disease: Secondary | ICD-10-CM | POA: Diagnosis not present

## 2016-11-06 DIAGNOSIS — D631 Anemia in chronic kidney disease: Secondary | ICD-10-CM | POA: Diagnosis not present

## 2016-11-06 DIAGNOSIS — S3210XD Unspecified fracture of sacrum, subsequent encounter for fracture with routine healing: Secondary | ICD-10-CM | POA: Diagnosis not present

## 2016-11-06 DIAGNOSIS — I429 Cardiomyopathy, unspecified: Secondary | ICD-10-CM | POA: Diagnosis not present

## 2016-11-09 DIAGNOSIS — D631 Anemia in chronic kidney disease: Secondary | ICD-10-CM | POA: Diagnosis not present

## 2016-11-09 DIAGNOSIS — I129 Hypertensive chronic kidney disease with stage 1 through stage 4 chronic kidney disease, or unspecified chronic kidney disease: Secondary | ICD-10-CM | POA: Diagnosis not present

## 2016-11-09 DIAGNOSIS — I429 Cardiomyopathy, unspecified: Secondary | ICD-10-CM | POA: Diagnosis not present

## 2016-11-09 DIAGNOSIS — E785 Hyperlipidemia, unspecified: Secondary | ICD-10-CM | POA: Diagnosis not present

## 2016-11-09 DIAGNOSIS — S3210XD Unspecified fracture of sacrum, subsequent encounter for fracture with routine healing: Secondary | ICD-10-CM | POA: Diagnosis not present

## 2016-11-09 DIAGNOSIS — J449 Chronic obstructive pulmonary disease, unspecified: Secondary | ICD-10-CM | POA: Diagnosis not present

## 2016-11-09 DIAGNOSIS — I2510 Atherosclerotic heart disease of native coronary artery without angina pectoris: Secondary | ICD-10-CM | POA: Diagnosis not present

## 2016-11-09 DIAGNOSIS — S50311D Abrasion of right elbow, subsequent encounter: Secondary | ICD-10-CM | POA: Diagnosis not present

## 2016-11-09 DIAGNOSIS — N189 Chronic kidney disease, unspecified: Secondary | ICD-10-CM | POA: Diagnosis not present

## 2016-11-09 DIAGNOSIS — S50312D Abrasion of left elbow, subsequent encounter: Secondary | ICD-10-CM | POA: Diagnosis not present

## 2016-11-10 DIAGNOSIS — J449 Chronic obstructive pulmonary disease, unspecified: Secondary | ICD-10-CM | POA: Diagnosis not present

## 2016-11-10 DIAGNOSIS — N189 Chronic kidney disease, unspecified: Secondary | ICD-10-CM | POA: Diagnosis not present

## 2016-11-10 DIAGNOSIS — I429 Cardiomyopathy, unspecified: Secondary | ICD-10-CM | POA: Diagnosis not present

## 2016-11-10 DIAGNOSIS — S3210XD Unspecified fracture of sacrum, subsequent encounter for fracture with routine healing: Secondary | ICD-10-CM | POA: Diagnosis not present

## 2016-11-10 DIAGNOSIS — S50311D Abrasion of right elbow, subsequent encounter: Secondary | ICD-10-CM | POA: Diagnosis not present

## 2016-11-10 DIAGNOSIS — S50312D Abrasion of left elbow, subsequent encounter: Secondary | ICD-10-CM | POA: Diagnosis not present

## 2016-11-10 DIAGNOSIS — I2510 Atherosclerotic heart disease of native coronary artery without angina pectoris: Secondary | ICD-10-CM | POA: Diagnosis not present

## 2016-11-10 DIAGNOSIS — I129 Hypertensive chronic kidney disease with stage 1 through stage 4 chronic kidney disease, or unspecified chronic kidney disease: Secondary | ICD-10-CM | POA: Diagnosis not present

## 2016-11-10 DIAGNOSIS — D631 Anemia in chronic kidney disease: Secondary | ICD-10-CM | POA: Diagnosis not present

## 2016-11-10 DIAGNOSIS — E785 Hyperlipidemia, unspecified: Secondary | ICD-10-CM | POA: Diagnosis not present

## 2016-11-11 DIAGNOSIS — S3210XD Unspecified fracture of sacrum, subsequent encounter for fracture with routine healing: Secondary | ICD-10-CM | POA: Diagnosis not present

## 2016-11-11 DIAGNOSIS — D631 Anemia in chronic kidney disease: Secondary | ICD-10-CM | POA: Diagnosis not present

## 2016-11-11 DIAGNOSIS — E785 Hyperlipidemia, unspecified: Secondary | ICD-10-CM | POA: Diagnosis not present

## 2016-11-11 DIAGNOSIS — I429 Cardiomyopathy, unspecified: Secondary | ICD-10-CM | POA: Diagnosis not present

## 2016-11-11 DIAGNOSIS — I129 Hypertensive chronic kidney disease with stage 1 through stage 4 chronic kidney disease, or unspecified chronic kidney disease: Secondary | ICD-10-CM | POA: Diagnosis not present

## 2016-11-11 DIAGNOSIS — J449 Chronic obstructive pulmonary disease, unspecified: Secondary | ICD-10-CM | POA: Diagnosis not present

## 2016-11-11 DIAGNOSIS — I2510 Atherosclerotic heart disease of native coronary artery without angina pectoris: Secondary | ICD-10-CM | POA: Diagnosis not present

## 2016-11-11 DIAGNOSIS — S50311D Abrasion of right elbow, subsequent encounter: Secondary | ICD-10-CM | POA: Diagnosis not present

## 2016-11-11 DIAGNOSIS — S50312D Abrasion of left elbow, subsequent encounter: Secondary | ICD-10-CM | POA: Diagnosis not present

## 2016-11-11 DIAGNOSIS — N189 Chronic kidney disease, unspecified: Secondary | ICD-10-CM | POA: Diagnosis not present

## 2016-11-12 ENCOUNTER — Emergency Department (HOSPITAL_COMMUNITY)
Admission: EM | Admit: 2016-11-12 | Discharge: 2016-11-12 | Disposition: A | Discharge status: Home / Self Care | Payer: Medicare HMO | Attending: Emergency Medicine | Admitting: Emergency Medicine

## 2016-11-12 ENCOUNTER — Emergency Department (HOSPITAL_COMMUNITY): Payer: Medicare HMO

## 2016-11-12 ENCOUNTER — Encounter (HOSPITAL_COMMUNITY): Payer: Self-pay | Admitting: Pharmacy Technician

## 2016-11-12 ENCOUNTER — Ambulatory Visit: Payer: Medicare HMO | Admitting: Cardiology

## 2016-11-12 DIAGNOSIS — R404 Transient alteration of awareness: Secondary | ICD-10-CM | POA: Diagnosis not present

## 2016-11-12 DIAGNOSIS — S0990XA Unspecified injury of head, initial encounter: Secondary | ICD-10-CM | POA: Diagnosis not present

## 2016-11-12 DIAGNOSIS — W010XXA Fall on same level from slipping, tripping and stumbling without subsequent striking against object, initial encounter: Secondary | ICD-10-CM | POA: Insufficient documentation

## 2016-11-12 DIAGNOSIS — S5001XA Contusion of right elbow, initial encounter: Secondary | ICD-10-CM | POA: Diagnosis not present

## 2016-11-12 DIAGNOSIS — Z79899 Other long term (current) drug therapy: Secondary | ICD-10-CM | POA: Diagnosis not present

## 2016-11-12 DIAGNOSIS — Z91010 Allergy to peanuts: Secondary | ICD-10-CM | POA: Diagnosis not present

## 2016-11-12 DIAGNOSIS — Y9301 Activity, walking, marching and hiking: Secondary | ICD-10-CM | POA: Insufficient documentation

## 2016-11-12 DIAGNOSIS — Z8542 Personal history of malignant neoplasm of other parts of uterus: Secondary | ICD-10-CM | POA: Insufficient documentation

## 2016-11-12 DIAGNOSIS — J45909 Unspecified asthma, uncomplicated: Secondary | ICD-10-CM | POA: Diagnosis not present

## 2016-11-12 DIAGNOSIS — J449 Chronic obstructive pulmonary disease, unspecified: Secondary | ICD-10-CM | POA: Diagnosis not present

## 2016-11-12 DIAGNOSIS — I10 Essential (primary) hypertension: Secondary | ICD-10-CM | POA: Diagnosis not present

## 2016-11-12 DIAGNOSIS — W19XXXA Unspecified fall, initial encounter: Secondary | ICD-10-CM

## 2016-11-12 DIAGNOSIS — M25521 Pain in right elbow: Secondary | ICD-10-CM | POA: Diagnosis not present

## 2016-11-12 DIAGNOSIS — M545 Low back pain: Secondary | ICD-10-CM | POA: Diagnosis not present

## 2016-11-12 DIAGNOSIS — R42 Dizziness and giddiness: Secondary | ICD-10-CM | POA: Diagnosis not present

## 2016-11-12 DIAGNOSIS — R55 Syncope and collapse: Secondary | ICD-10-CM | POA: Diagnosis not present

## 2016-11-12 DIAGNOSIS — Y999 Unspecified external cause status: Secondary | ICD-10-CM | POA: Diagnosis not present

## 2016-11-12 DIAGNOSIS — S3993XA Unspecified injury of pelvis, initial encounter: Secondary | ICD-10-CM | POA: Diagnosis not present

## 2016-11-12 DIAGNOSIS — T07XXXA Unspecified multiple injuries, initial encounter: Secondary | ICD-10-CM

## 2016-11-12 DIAGNOSIS — S3210XA Unspecified fracture of sacrum, initial encounter for closed fracture: Secondary | ICD-10-CM | POA: Diagnosis not present

## 2016-11-12 DIAGNOSIS — S5000XA Contusion of unspecified elbow, initial encounter: Secondary | ICD-10-CM

## 2016-11-12 DIAGNOSIS — Y92002 Bathroom of unspecified non-institutional (private) residence single-family (private) house as the place of occurrence of the external cause: Secondary | ICD-10-CM | POA: Insufficient documentation

## 2016-11-12 DIAGNOSIS — S12100A Unspecified displaced fracture of second cervical vertebra, initial encounter for closed fracture: Secondary | ICD-10-CM | POA: Diagnosis not present

## 2016-11-12 DIAGNOSIS — Z043 Encounter for examination and observation following other accident: Secondary | ICD-10-CM | POA: Diagnosis not present

## 2016-11-12 DIAGNOSIS — S59901A Unspecified injury of right elbow, initial encounter: Secondary | ICD-10-CM | POA: Diagnosis not present

## 2016-11-12 MED ORDER — ACETAMINOPHEN 325 MG PO TABS
650.0000 mg | ORAL_TABLET | Freq: Once | ORAL | Status: AC
  Administered 2016-11-12: 650 mg via ORAL
  Filled 2016-11-12: qty 2

## 2016-11-12 NOTE — ED Provider Notes (Signed)
[facility] DEPT Provider Note   CSN: [id number] Arrival date & time: 11/12/16  0107  By signing my name below, I, Margit Banda, attest that this documentation has been prepared under the direction and in the presence of Varney Biles, MD. Electronically Signed: Margit Banda, ED Scribe. 11/12/16. 1:43 AM.  History   Chief Complaint Chief Complaint  Patient presents with  . Fall    HPI LYSETTE LINDENBAUM is a 83 y.o. female who presents to the Emergency Department complaining of pain to her bottom s/p a mechanical fall that occurred shortly PTA. Pt was coming out of the bathroom when she fell. She notes landing more on the carpet than the bathroom tile. Pt lives alone and has life alert. No LOC or head injury. Pt uses a cane at baseline. Was not able to get up after her fall, and has not ambulated since fall. Not on blood thinners. Pt denies extremity pain.  The history is provided by the patient. No language interpreter was used.    Past Medical History:  Diagnosis Date  . Anemia   . Arthritis    "in my legs and feet" (05/11/2014)  . Cardiomyopathy ([place])   . Chronic bronchitis ([place]) "2-3 times/yr"  . Complication of anesthesia    "started flinching at start of hysterectomy; they had to give me more anesthetic"  . COPD with asthma ([place])   . Dyslipidemia   . Echocardiogram abnormal 12/05/2010   Moderate concentric left ventricular hypertrophy EF > 38% stage I diastolic dysfunction, left atrium mild to moderate dilatation, moderate mitral annular calcification with trace MR. Trace TR.  Marland Kitchen HTN (hypertension)   . Pneumonia "several times"  . Squamous carcinoma    "left face and thigh"  . Uterine cancer [facility])     Patient Active Problem List   Diagnosis Date Noted  . Sacral fracture, closed ([place]) 10/12/2016  . Physical deconditioning   . Left hip pain 10/05/2016  . Anemia due to chronic kidney disease   . Extrinsic asthma 05/12/2014  . Weakness 06/21/2013  .  Hyponatremia 06/11/2013  . COPD (chronic obstructive pulmonary disease) ([place]) 06/11/2013  . Hypokalemia 06/11/2013  . Anemia 06/11/2013  . Essential hypertension   . Dyslipidemia     Past Surgical History:  Procedure Laterality Date  . APPENDECTOMY  1954  . BREAST BIOPSY Left    "it was nothing"  . CARDIAC EVENT MONITOR  12/05/10-01/02/11   SINUS RHYTHM  . CATARACT EXTRACTION W/ INTRAOCULAR LENS  IMPLANT, BILATERAL Bilateral   . CHOLECYSTECTOMY  2003  . DILATION AND CURETTAGE OF UTERUS  1966  . SQUAMOUS CELL CARCINOMA EXCISION Left    face and thigh  . TRANSTHORACIC ECHOCARDIOGRAM  December 05, 2010   moderate cocentric LVH WITH STAGE 1 IMPARIED DIASTOLIC DYSFUNCTION , NORMAL EF AND NORMAL LV PRESSURES. LEFT ATRIUM WAS ONLY MILD TO MODERATELY DILATED .MOD MITRAL CALCIFICATION WITH ONLY MILD REGURGITATION.,OTHERWISE RELATIVELY NORMAL  . VAGINAL HYSTERECTOMY  1966    OB History    No data available       Home Medications    Prior to Admission medications   Medication Sig Start Date End Date Taking? Authorizing Provider  acetaminophen (TYLENOL) 500 MG tablet Take 1,000 mg by mouth 3 (three) times daily.     [provider]  albuterol (PROVENTIL HFA;VENTOLIN HFA) 108 (90 Base) MCG/ACT inhaler Inhale 2 puffs into the lungs every 6 (six) hours as needed for wheezing or shortness of breath.    [provider]  albuterol (PROVENTIL) (2.5 MG/3ML) 0.083% nebulizer solution Take 2.5 mg by nebulization every 8 (eight) hours as needed for wheezing or shortness of breath.     [provider]  atorvastatin (LIPITOR) 10 MG tablet Take 10 mg by mouth every other day.  01/30/13   [provider]  Cholecalciferol 1000 units tablet Take 1,000 Units by mouth daily.    [provider]  cyanocobalamin 1000 MCG tablet Take 1,000 mcg by mouth daily with breakfast.    [provider]  diclofenac sodium (VOLTAREN) 1 % GEL Apply 1 application topically  daily.  07/24/16   [provider]  DILT-XR 120 MG 24 hr capsule TAKE 1 CAPSULE DAILY 09/01/16   [name], MD  fluticasone [facility]) 50 MCG/ACT nasal spray Place 2 sprays into both nostrils at bedtime.  07/15/16   [provider]  Fluticasone-Salmeterol (ADVAIR) 250-50 MCG/DOSE AEPB Inhale 1 puff into the lungs 2 (two) times daily.    [provider]  furosemide (LASIX) 20 MG tablet Take 1 tablet (20 mg total) by mouth daily. One tab Monday, Wednesday, Friday and Saturday 10/22/16 01/20/17  [name], MD  hydrALAZINE (APRESOLINE) 10 MG tablet Take 1 tablet (10 mg total) by mouth every 6 (six) hours. 10/08/16   [name], MD  losartan (COZAAR) 50 MG tablet Take 3 tablets (150 mg total) by mouth daily. 10/09/16   [name], MD  Menthol, Topical Analgesic, (BIOFREEZE) 4 % GEL Apply 1 application topically 3 (three) times daily.    [provider]  montelukast (SINGULAIR) 10 MG tablet Take 10 mg by mouth at bedtime.  12/03/12   [provider]  Multiple Vitamin (MULTIVITAMIN WITH MINERALS) TABS tablet Take 1 tablet by mouth daily with breakfast.     [provider]  pantoprazole (PROTONIX) 40 MG tablet Take 1 tablet (40 mg total) by mouth daily. 06/13/13   [name], MD  polyethylene glycol [facility] / Floria Raveling) packet Take 17 g by mouth daily as needed for mild constipation. 10/08/16   [name], MD  polyvinyl alcohol (ARTIFICIAL TEARS) 1.4 % ophthalmic solution Place 1 drop into both eyes 3 (three) times daily.    [provider]  Skin Protectants, Misc. (CALAZIME SKIN PROTECTANT EX) Apply topically to Buttocks two times daily for wound care    [provider]  traMADol (ULTRAM) 50 MG tablet Take 1 tablet (50 mg total) by mouth every 6 (six) hours as needed for moderate pain. 10/08/16   [name], MD  vitamin C (ASCORBIC ACID) 250 MG tablet Take 250 mg by mouth daily.    [provider]  Wound  Cleansers (WOUND CLEANSER EX) Cleanse left elbow daily    [provider]    Family History Family History  Problem Relation Age of Onset  . Heart attack Mother   . Cancer Father   . Heart attack Maternal Grandmother   . Heart disease Son   . Asthma Daughter     Social History Social History  Substance Use Topics  . Smoking status: Never Smoker  . Smokeless tobacco: Never Used  . Alcohol use No     Allergies   Almond (diagnostic); Asa [aspirin]; Coconut flavor; Neomycin; Peanut-containing drug products; Penicillins; Sulfa antibiotics; Tizanidine; Lisinopril; and Keflex [cephalexin]   Review of Systems Review of Systems  Musculoskeletal: Negative for arthralgias and myalgias.       + buttocks pain  Neurological: Negative for syncope.       -  head injury.  All other systems reviewed and are negative.    Physical Exam Updated Vital Signs BP 110/75   Pulse 62   Temp 98.8 F (37.1 C) (Oral)   Resp 19   SpO2 100%   Physical Exam  Constitutional: She is oriented to person, place, and time. She appears well-developed and well-nourished.  HENT:  Head: Normocephalic.  No scalp or facial hematoma or ecchymosis.   Eyes: EOM are normal.  Neck: Normal range of motion.  No midline C-spine tenderness.   Cardiovascular: Normal rate and regular rhythm.   Murmur heard. Pulmonary/Chest: Effort normal.  Abdominal: She exhibits no distension.  Musculoskeletal: Normal range of motion.  Edema and erythema over the right elbow with mild tenderness to palpation. No gross deformity appreciated over the upper and lower extremities. Pelvis is stable. Tenderness over coccyx region. No step-off noted.   Neurological: She is alert and oriented to person, place, and time.  Psychiatric: She has a normal mood and affect.  Nursing note and vitals reviewed.    ED Treatments / Results  DIAGNOSTIC STUDIES: Oxygen Saturation is 98% on RA, normal by my interpretation.    COORDINATION OF CARE: 1:43 AM-Discussed next steps with pt. Pt verbalized understanding and is agreeable with the plan.   Labs (all labs ordered are listed, but only abnormal results are displayed) Labs Reviewed - No data to display  EKG  EKG Interpretation None       Radiology Dg Lumbar Spine Complete  Result Date: 11/12/2016 CLINICAL DATA:  Fall with lumbosacral and sacrococcygeal back pain. EXAM: LUMBAR SPINE - COMPLETE 4+ VIEW COMPARISON:  Radiograph 09/10/2016 FINDINGS: Vertebral body labeled L5 may be in fact lumbarization of S1, difficult to differentiate on the PA view as there may be 6 non-rib-bearing lumbar vertebra. No acute fracture. Stable mild rightward curvature of the lumbar spine. Vertebral body heights are maintained. Diffuse disc space narrowing, endplate spurring, and facet arthropathy throughout the lumbar spine, unchanged from prior exam. Dense aortic atherosclerosis. IMPRESSION: No evidence of acute lumbar spine fracture. Diffuse degenerative disc disease and facet arthropathy. Electronically Signed   By: Jeb Levering M.D.   On: 11/12/2016 02:16   Dg Pelvis 1-2 Views  Result Date: 11/12/2016 CLINICAL DATA:  Fall with lumbosacral and sacrococcygeal back pain. EXAM: PELVIS - 1-2 VIEW COMPARISON:  Pelvic MRI 10/05/2016 FINDINGS: Cortical margins of the bony pelvis are intact. The as for sacral fracture on prior MRI is not seen on AP view the pelvis. Pubic rami are intact. Both femoral heads are seated in the acetabulum. Pubic symphysis and sacroiliac joints are congruent. IMPRESSION: No evidence of acute pelvic fracture. Electronically Signed   By: Jeb Levering M.D.   On: 11/12/2016 02:12   Dg Sacrum/coccyx  Result Date: 11/12/2016 CLINICAL DATA:  Fall with lumbosacral and sacrococcygeal back pain. EXAM: SACRUM AND COCCYX - 2+ VIEW COMPARISON:  Pelvic MRI 10/05/2016 FINDINGS: Minimally displaced fracture in the lower sacrum, as seen on prior MRI. No evidence  of new fracture. The sacral ala are maintained. Sacroiliac joints are congruent. IMPRESSION: Minimally displaced sacral fracture, as seen on recent MRI. No evidence of new fracture. Electronically Signed   By: Jeb Levering M.D.   On: 11/12/2016 02:17   Dg Elbow 2 Views Right  Result Date: 11/12/2016 CLINICAL DATA:  83 year old female with fall and right elbow pain. EXAM: RIGHT ELBOW - 2 VIEW COMPARISON:  None. FINDINGS: No obvious fracture identified. Evaluation of the fracture however is limited due to  osteopenia. There is no dislocation. There is elevation of the anterior fat pad indicative of joint effusion. An occult fracture is not entirely excluded CT may provide better evaluation if there is high clinical suspicion for fracture. There is mild diffuse subcutaneous edema. IMPRESSION: No definite acute fracture. CT may provide better evaluation if there is high clinical concern for an occult fracture. Electronically Signed   By: Anner Crete M.D.   On: 11/12/2016 02:13   Ct Head Wo Contrast  Result Date: 11/12/2016 CLINICAL DATA:  Fall. EXAM: CT HEAD WITHOUT CONTRAST TECHNIQUE: Contiguous axial images were obtained from the base of the skull through the vertex without intravenous contrast. COMPARISON:  Head CT 06/30/2016 FINDINGS: Brain: No evidence of acute infarction, hemorrhage, hydrocephalus, extra-axial collection or mass lesion/mass effect. Age related atrophy and mild for age chronic small vessel ischemia. Vascular: Atherosclerosis of skullbase vasculature without hyperdense vessel or abnormal calcification. Skull: No acute fracture.  No focal lesion. Sinuses/Orbits: Unchanged opacification of left mastoid air cells. Paranasal sinuses are well-aerated. Bilateral cataract resection. Other: None. IMPRESSION: No acute intracranial abnormality.  No skull fracture. Chronic opacification of left mastoid air cells. Electronically Signed   By: Jeb Levering M.D.   On: 11/12/2016 02:32     Procedures Procedures (including critical care time)  Medications Ordered in ED Medications  acetaminophen (TYLENOL) tablet 650 mg (650 mg Oral Given 11/12/16 0317)     Initial Impression / Assessment and Plan / ED Course  I have reviewed the triage vital signs and the nursing notes.  Pertinent labs & imaging results that were available during my care of the patient were reviewed by me and considered in my medical decision making (see chart for details).     DDx includes: - Mechanical falls - ICH - Fractures - Contusions - Soft tissue injury   Pt comes in after a fall. Appropriate imaging ordered based on exam findings. No fracture or bleed seen. Pt was able to ambulate. Fall was mechanical.  Final Clinical Impressions(s) / ED Diagnoses   Final diagnoses:  Fall, initial encounter  Multiple contusions  Contusion of elbow, unspecified laterality, initial encounter    New Prescriptions Discharge Medication List as of 11/12/2016  5:55 AM     I personally performed the services described in this documentation, which was scribed in my presence. The recorded information has been reviewed and is accurate.     Varney Biles, MD 11/14/16 [phone]

## 2016-11-12 NOTE — Discharge Instructions (Signed)
We saw you in the ER after you had a fall. All the imaging results are normal, no fractures seen. No evidence of brain bleed. Please be very careful with walking, and do everything possible to prevent falls.  See your doctor in 1 week. Please ice the elbow for pain relief. Take over the counter tylenol for pain.

## 2016-11-12 NOTE — ED Notes (Signed)
Ambulated in hall with 2 person assist.  Tolerated well.  Little assistance needed.

## 2016-11-12 NOTE — ED Notes (Signed)
Leaving with [facility] at this time. 

## 2016-11-12 NOTE — ED Notes (Signed)
Pt returned from radiology.

## 2016-11-12 NOTE — ED Triage Notes (Signed)
Pt arrives to the ED via EMS from home with reports of mechanical fall onto her bottom. Pt with hx of tailbone fx and states this pain feels the same. Pt hypertensive 160/80 with EMS. Other VSS.

## 2016-11-13 DIAGNOSIS — J449 Chronic obstructive pulmonary disease, unspecified: Secondary | ICD-10-CM | POA: Diagnosis not present

## 2016-11-13 DIAGNOSIS — S50312D Abrasion of left elbow, subsequent encounter: Secondary | ICD-10-CM | POA: Diagnosis not present

## 2016-11-13 DIAGNOSIS — D631 Anemia in chronic kidney disease: Secondary | ICD-10-CM | POA: Diagnosis not present

## 2016-11-13 DIAGNOSIS — S3210XD Unspecified fracture of sacrum, subsequent encounter for fracture with routine healing: Secondary | ICD-10-CM | POA: Diagnosis not present

## 2016-11-13 DIAGNOSIS — I429 Cardiomyopathy, unspecified: Secondary | ICD-10-CM | POA: Diagnosis not present

## 2016-11-13 DIAGNOSIS — N189 Chronic kidney disease, unspecified: Secondary | ICD-10-CM | POA: Diagnosis not present

## 2016-11-13 DIAGNOSIS — I2510 Atherosclerotic heart disease of native coronary artery without angina pectoris: Secondary | ICD-10-CM | POA: Diagnosis not present

## 2016-11-13 DIAGNOSIS — S50311D Abrasion of right elbow, subsequent encounter: Secondary | ICD-10-CM | POA: Diagnosis not present

## 2016-11-13 DIAGNOSIS — I129 Hypertensive chronic kidney disease with stage 1 through stage 4 chronic kidney disease, or unspecified chronic kidney disease: Secondary | ICD-10-CM | POA: Diagnosis not present

## 2016-11-13 DIAGNOSIS — E785 Hyperlipidemia, unspecified: Secondary | ICD-10-CM | POA: Diagnosis not present

## 2016-11-14 ENCOUNTER — Other Ambulatory Visit: Payer: Self-pay | Admitting: Cardiology

## 2016-11-14 DIAGNOSIS — N189 Chronic kidney disease, unspecified: Secondary | ICD-10-CM | POA: Diagnosis not present

## 2016-11-14 DIAGNOSIS — J449 Chronic obstructive pulmonary disease, unspecified: Secondary | ICD-10-CM | POA: Diagnosis not present

## 2016-11-14 DIAGNOSIS — S50312D Abrasion of left elbow, subsequent encounter: Secondary | ICD-10-CM | POA: Diagnosis not present

## 2016-11-14 DIAGNOSIS — I429 Cardiomyopathy, unspecified: Secondary | ICD-10-CM | POA: Diagnosis not present

## 2016-11-14 DIAGNOSIS — S50311D Abrasion of right elbow, subsequent encounter: Secondary | ICD-10-CM | POA: Diagnosis not present

## 2016-11-14 DIAGNOSIS — D631 Anemia in chronic kidney disease: Secondary | ICD-10-CM | POA: Diagnosis not present

## 2016-11-14 DIAGNOSIS — I129 Hypertensive chronic kidney disease with stage 1 through stage 4 chronic kidney disease, or unspecified chronic kidney disease: Secondary | ICD-10-CM | POA: Diagnosis not present

## 2016-11-14 DIAGNOSIS — I2510 Atherosclerotic heart disease of native coronary artery without angina pectoris: Secondary | ICD-10-CM | POA: Diagnosis not present

## 2016-11-14 DIAGNOSIS — S3210XD Unspecified fracture of sacrum, subsequent encounter for fracture with routine healing: Secondary | ICD-10-CM | POA: Diagnosis not present

## 2016-11-14 DIAGNOSIS — E785 Hyperlipidemia, unspecified: Secondary | ICD-10-CM | POA: Diagnosis not present

## 2016-11-16 DIAGNOSIS — S3210XD Unspecified fracture of sacrum, subsequent encounter for fracture with routine healing: Secondary | ICD-10-CM | POA: Diagnosis not present

## 2016-11-16 DIAGNOSIS — J449 Chronic obstructive pulmonary disease, unspecified: Secondary | ICD-10-CM | POA: Diagnosis not present

## 2016-11-16 DIAGNOSIS — I2510 Atherosclerotic heart disease of native coronary artery without angina pectoris: Secondary | ICD-10-CM | POA: Diagnosis not present

## 2016-11-16 DIAGNOSIS — S50312D Abrasion of left elbow, subsequent encounter: Secondary | ICD-10-CM | POA: Diagnosis not present

## 2016-11-16 DIAGNOSIS — E785 Hyperlipidemia, unspecified: Secondary | ICD-10-CM | POA: Diagnosis not present

## 2016-11-16 DIAGNOSIS — D631 Anemia in chronic kidney disease: Secondary | ICD-10-CM | POA: Diagnosis not present

## 2016-11-16 DIAGNOSIS — S50311D Abrasion of right elbow, subsequent encounter: Secondary | ICD-10-CM | POA: Diagnosis not present

## 2016-11-16 DIAGNOSIS — I129 Hypertensive chronic kidney disease with stage 1 through stage 4 chronic kidney disease, or unspecified chronic kidney disease: Secondary | ICD-10-CM | POA: Diagnosis not present

## 2016-11-16 DIAGNOSIS — N189 Chronic kidney disease, unspecified: Secondary | ICD-10-CM | POA: Diagnosis not present

## 2016-11-16 DIAGNOSIS — I429 Cardiomyopathy, unspecified: Secondary | ICD-10-CM | POA: Diagnosis not present

## 2016-11-19 DIAGNOSIS — I429 Cardiomyopathy, unspecified: Secondary | ICD-10-CM | POA: Diagnosis not present

## 2016-11-19 DIAGNOSIS — J449 Chronic obstructive pulmonary disease, unspecified: Secondary | ICD-10-CM | POA: Diagnosis not present

## 2016-11-19 DIAGNOSIS — I129 Hypertensive chronic kidney disease with stage 1 through stage 4 chronic kidney disease, or unspecified chronic kidney disease: Secondary | ICD-10-CM | POA: Diagnosis not present

## 2016-11-19 DIAGNOSIS — S50311D Abrasion of right elbow, subsequent encounter: Secondary | ICD-10-CM | POA: Diagnosis not present

## 2016-11-19 DIAGNOSIS — E785 Hyperlipidemia, unspecified: Secondary | ICD-10-CM | POA: Diagnosis not present

## 2016-11-19 DIAGNOSIS — N189 Chronic kidney disease, unspecified: Secondary | ICD-10-CM | POA: Diagnosis not present

## 2016-11-19 DIAGNOSIS — S50312D Abrasion of left elbow, subsequent encounter: Secondary | ICD-10-CM | POA: Diagnosis not present

## 2016-11-19 DIAGNOSIS — D631 Anemia in chronic kidney disease: Secondary | ICD-10-CM | POA: Diagnosis not present

## 2016-11-19 DIAGNOSIS — I2510 Atherosclerotic heart disease of native coronary artery without angina pectoris: Secondary | ICD-10-CM | POA: Diagnosis not present

## 2016-11-19 DIAGNOSIS — S3210XD Unspecified fracture of sacrum, subsequent encounter for fracture with routine healing: Secondary | ICD-10-CM | POA: Diagnosis not present

## 2016-11-20 DIAGNOSIS — L03113 Cellulitis of right upper limb: Secondary | ICD-10-CM | POA: Diagnosis not present

## 2016-11-20 DIAGNOSIS — M545 Low back pain: Secondary | ICD-10-CM | POA: Diagnosis not present

## 2016-11-21 DIAGNOSIS — S3210XD Unspecified fracture of sacrum, subsequent encounter for fracture with routine healing: Secondary | ICD-10-CM | POA: Diagnosis not present

## 2016-11-21 DIAGNOSIS — S50311D Abrasion of right elbow, subsequent encounter: Secondary | ICD-10-CM | POA: Diagnosis not present

## 2016-11-21 DIAGNOSIS — D631 Anemia in chronic kidney disease: Secondary | ICD-10-CM | POA: Diagnosis not present

## 2016-11-21 DIAGNOSIS — I129 Hypertensive chronic kidney disease with stage 1 through stage 4 chronic kidney disease, or unspecified chronic kidney disease: Secondary | ICD-10-CM | POA: Diagnosis not present

## 2016-11-21 DIAGNOSIS — E785 Hyperlipidemia, unspecified: Secondary | ICD-10-CM | POA: Diagnosis not present

## 2016-11-21 DIAGNOSIS — I2510 Atherosclerotic heart disease of native coronary artery without angina pectoris: Secondary | ICD-10-CM | POA: Diagnosis not present

## 2016-11-21 DIAGNOSIS — J449 Chronic obstructive pulmonary disease, unspecified: Secondary | ICD-10-CM | POA: Diagnosis not present

## 2016-11-21 DIAGNOSIS — I429 Cardiomyopathy, unspecified: Secondary | ICD-10-CM | POA: Diagnosis not present

## 2016-11-21 DIAGNOSIS — N189 Chronic kidney disease, unspecified: Secondary | ICD-10-CM | POA: Diagnosis not present

## 2016-11-21 DIAGNOSIS — S50312D Abrasion of left elbow, subsequent encounter: Secondary | ICD-10-CM | POA: Diagnosis not present

## 2016-11-22 DIAGNOSIS — E871 Hypo-osmolality and hyponatremia: Secondary | ICD-10-CM

## 2016-11-22 HISTORY — DX: Hypo-osmolality and hyponatremia: E87.1

## 2016-11-23 DIAGNOSIS — S50311D Abrasion of right elbow, subsequent encounter: Secondary | ICD-10-CM | POA: Diagnosis not present

## 2016-11-23 DIAGNOSIS — D631 Anemia in chronic kidney disease: Secondary | ICD-10-CM | POA: Diagnosis not present

## 2016-11-23 DIAGNOSIS — I429 Cardiomyopathy, unspecified: Secondary | ICD-10-CM | POA: Diagnosis not present

## 2016-11-23 DIAGNOSIS — I129 Hypertensive chronic kidney disease with stage 1 through stage 4 chronic kidney disease, or unspecified chronic kidney disease: Secondary | ICD-10-CM | POA: Diagnosis not present

## 2016-11-23 DIAGNOSIS — N189 Chronic kidney disease, unspecified: Secondary | ICD-10-CM | POA: Diagnosis not present

## 2016-11-23 DIAGNOSIS — S50312D Abrasion of left elbow, subsequent encounter: Secondary | ICD-10-CM | POA: Diagnosis not present

## 2016-11-23 DIAGNOSIS — J449 Chronic obstructive pulmonary disease, unspecified: Secondary | ICD-10-CM | POA: Diagnosis not present

## 2016-11-23 DIAGNOSIS — S3210XD Unspecified fracture of sacrum, subsequent encounter for fracture with routine healing: Secondary | ICD-10-CM | POA: Diagnosis not present

## 2016-11-23 DIAGNOSIS — I2510 Atherosclerotic heart disease of native coronary artery without angina pectoris: Secondary | ICD-10-CM | POA: Diagnosis not present

## 2016-11-23 DIAGNOSIS — E785 Hyperlipidemia, unspecified: Secondary | ICD-10-CM | POA: Diagnosis not present

## 2016-11-24 ENCOUNTER — Inpatient Hospital Stay (HOSPITAL_COMMUNITY)
Admission: EM | Admit: 2016-11-24 | Discharge: 2016-11-27 | DRG: 641 | Disposition: A | Discharge status: Home / Self Care | Payer: Medicare HMO | Attending: Internal Medicine | Admitting: Internal Medicine

## 2016-11-24 ENCOUNTER — Emergency Department (HOSPITAL_COMMUNITY): Payer: Medicare HMO

## 2016-11-24 ENCOUNTER — Encounter (HOSPITAL_COMMUNITY): Payer: Self-pay | Admitting: Emergency Medicine

## 2016-11-24 DIAGNOSIS — S3992XA Unspecified injury of lower back, initial encounter: Secondary | ICD-10-CM | POA: Diagnosis not present

## 2016-11-24 DIAGNOSIS — Z9842 Cataract extraction status, left eye: Secondary | ICD-10-CM

## 2016-11-24 DIAGNOSIS — R296 Repeated falls: Secondary | ICD-10-CM | POA: Diagnosis present

## 2016-11-24 DIAGNOSIS — Z9841 Cataract extraction status, right eye: Secondary | ICD-10-CM

## 2016-11-24 DIAGNOSIS — M25552 Pain in left hip: Secondary | ICD-10-CM | POA: Diagnosis present

## 2016-11-24 DIAGNOSIS — Z90710 Acquired absence of both cervix and uterus: Secondary | ICD-10-CM | POA: Diagnosis not present

## 2016-11-24 DIAGNOSIS — Z888 Allergy status to other drugs, medicaments and biological substances status: Secondary | ICD-10-CM | POA: Diagnosis not present

## 2016-11-24 DIAGNOSIS — J449 Chronic obstructive pulmonary disease, unspecified: Secondary | ICD-10-CM | POA: Diagnosis not present

## 2016-11-24 DIAGNOSIS — E538 Deficiency of other specified B group vitamins: Secondary | ICD-10-CM | POA: Diagnosis present

## 2016-11-24 DIAGNOSIS — E876 Hypokalemia: Secondary | ICD-10-CM | POA: Diagnosis not present

## 2016-11-24 DIAGNOSIS — Z8542 Personal history of malignant neoplasm of other parts of uterus: Secondary | ICD-10-CM | POA: Diagnosis not present

## 2016-11-24 DIAGNOSIS — R102 Pelvic and perineal pain: Secondary | ICD-10-CM | POA: Diagnosis not present

## 2016-11-24 DIAGNOSIS — Z961 Presence of intraocular lens: Secondary | ICD-10-CM | POA: Diagnosis present

## 2016-11-24 DIAGNOSIS — D509 Iron deficiency anemia, unspecified: Secondary | ICD-10-CM | POA: Diagnosis present

## 2016-11-24 DIAGNOSIS — Z809 Family history of malignant neoplasm, unspecified: Secondary | ICD-10-CM | POA: Diagnosis not present

## 2016-11-24 DIAGNOSIS — M545 Low back pain: Secondary | ICD-10-CM | POA: Diagnosis not present

## 2016-11-24 DIAGNOSIS — T502X5A Adverse effect of carbonic-anhydrase inhibitors, benzothiadiazides and other diuretics, initial encounter: Secondary | ICD-10-CM | POA: Diagnosis present

## 2016-11-24 DIAGNOSIS — W19XXXA Unspecified fall, initial encounter: Secondary | ICD-10-CM

## 2016-11-24 DIAGNOSIS — E871 Hypo-osmolality and hyponatremia: Secondary | ICD-10-CM

## 2016-11-24 DIAGNOSIS — W010XXA Fall on same level from slipping, tripping and stumbling without subsequent striking against object, initial encounter: Secondary | ICD-10-CM | POA: Diagnosis present

## 2016-11-24 DIAGNOSIS — Z8249 Family history of ischemic heart disease and other diseases of the circulatory system: Secondary | ICD-10-CM | POA: Diagnosis not present

## 2016-11-24 DIAGNOSIS — Y92002 Bathroom of unspecified non-institutional (private) residence single-family (private) house as the place of occurrence of the external cause: Secondary | ICD-10-CM

## 2016-11-24 DIAGNOSIS — M160 Bilateral primary osteoarthritis of hip: Secondary | ICD-10-CM | POA: Diagnosis not present

## 2016-11-24 DIAGNOSIS — N189 Chronic kidney disease, unspecified: Secondary | ICD-10-CM | POA: Diagnosis present

## 2016-11-24 DIAGNOSIS — I10 Essential (primary) hypertension: Secondary | ICD-10-CM | POA: Diagnosis not present

## 2016-11-24 DIAGNOSIS — D631 Anemia in chronic kidney disease: Secondary | ICD-10-CM | POA: Diagnosis present

## 2016-11-24 DIAGNOSIS — Z66 Do not resuscitate: Secondary | ICD-10-CM | POA: Diagnosis present

## 2016-11-24 DIAGNOSIS — Z882 Allergy status to sulfonamides status: Secondary | ICD-10-CM

## 2016-11-24 DIAGNOSIS — R52 Pain, unspecified: Secondary | ICD-10-CM

## 2016-11-24 DIAGNOSIS — Z79899 Other long term (current) drug therapy: Secondary | ICD-10-CM | POA: Diagnosis not present

## 2016-11-24 DIAGNOSIS — R5381 Other malaise: Secondary | ICD-10-CM

## 2016-11-24 DIAGNOSIS — Z880 Allergy status to penicillin: Secondary | ICD-10-CM

## 2016-11-24 DIAGNOSIS — Z7951 Long term (current) use of inhaled steroids: Secondary | ICD-10-CM | POA: Diagnosis not present

## 2016-11-24 DIAGNOSIS — M25559 Pain in unspecified hip: Secondary | ICD-10-CM | POA: Diagnosis not present

## 2016-11-24 DIAGNOSIS — T148XXA Other injury of unspecified body region, initial encounter: Secondary | ICD-10-CM | POA: Diagnosis not present

## 2016-11-24 DIAGNOSIS — Z825 Family history of asthma and other chronic lower respiratory diseases: Secondary | ICD-10-CM | POA: Diagnosis not present

## 2016-11-24 DIAGNOSIS — Z9049 Acquired absence of other specified parts of digestive tract: Secondary | ICD-10-CM

## 2016-11-24 HISTORY — DX: Hypo-osmolality and hyponatremia: E87.1

## 2016-11-24 LAB — BASIC METABOLIC PANEL
ANION GAP: 8 (ref 5–15)
Anion gap: 9 (ref 5–15)
BUN: 8 mg/dL (ref 6–20)
BUN: 7 mg/dL (ref 6–20)
CALCIUM: 7.8 mg/dL — AB (ref 8.9–10.3)
CO2: 26 mmol/L (ref 22–32)
CO2: 24 mmol/L (ref 22–32)
Calcium: 8.2 mg/dL — ABNORMAL LOW (ref 8.9–10.3)
Chloride: 80 mmol/L — ABNORMAL LOW (ref 101–111)
Chloride: 84 mmol/L — ABNORMAL LOW (ref 101–111)
Creatinine, Ser: 0.76 mg/dL (ref 0.44–1.00)
Creatinine, Ser: 0.76 mg/dL (ref 0.44–1.00)
GFR calc Af Amer: >60 mL/min (ref >60)
GFR calc non Af Amer: >60 mL/min (ref >60)
GLUCOSE: 146 mg/dL — AB (ref 65–99)
Glucose, Bld: 97 mg/dL (ref 65–99)
POTASSIUM: 3.2 mmol/L — AB (ref 3.5–5.1)
Potassium: 3.3 mmol/L — ABNORMAL LOW (ref 3.5–5.1)
SODIUM: 116 mmol/L — AB (ref 135–145)
Sodium: 115 mmol/L — CL (ref 135–145)

## 2016-11-24 LAB — URINALYSIS, ROUTINE W REFLEX MICROSCOPIC
BILIRUBIN URINE: NEGATIVE
GLUCOSE, UA: NEGATIVE mg/dL
KETONES UR: NEGATIVE mg/dL
NITRITE: NEGATIVE
PH: 7 (ref 5.0–8.0)
PROTEIN: 30 mg/dL — AB
Specific Gravity, Urine: <1.005 — ABNORMAL LOW (ref 1.005–1.030)

## 2016-11-24 LAB — OSMOLALITY, URINE: Osmolality, Ur: 234 mOsm/kg — ABNORMAL LOW (ref 300–900)

## 2016-11-24 LAB — CBC WITH DIFFERENTIAL/PLATELET
Basophils Absolute: 0 10*3/uL (ref 0.0–0.1)
Basophils Relative: 0 %
Eosinophils Absolute: 0.1 10*3/uL (ref 0.0–0.7)
Eosinophils Relative: 2 %
HCT: 28.2 % — ABNORMAL LOW (ref 36.0–46.0)
Hemoglobin: 9.6 g/dL — ABNORMAL LOW (ref 12.0–15.0)
Lymphocytes Relative: 22 %
Lymphs Abs: 1.5 10*3/uL (ref 0.7–4.0)
MCH: 25.2 pg — ABNORMAL LOW (ref 26.0–34.0)
MCHC: 34 g/dL (ref 30.0–36.0)
MCV: 74 fL — ABNORMAL LOW (ref 78.0–100.0)
Monocytes Absolute: 0.5 10*3/uL (ref 0.1–1.0)
Monocytes Relative: 8 %
Neutro Abs: 4.4 10*3/uL (ref 1.7–7.7)
Neutrophils Relative %: 68 %
Platelets: 289 10*3/uL (ref 150–400)
RBC: 3.81 MIL/uL — ABNORMAL LOW (ref 3.87–5.11)
RDW: 17.2 % — ABNORMAL HIGH (ref 11.5–15.5)
WBC: 6.5 10*3/uL (ref 4.0–10.5)

## 2016-11-24 LAB — URIC ACID: URIC ACID, SERUM: 2.3 mg/dL (ref 2.3–6.6)

## 2016-11-24 LAB — URINALYSIS, MICROSCOPIC (REFLEX)

## 2016-11-24 LAB — TSH: TSH: 14.673 u[IU]/mL — AB (ref 0.350–4.500)

## 2016-11-24 MED ORDER — ADULT MULTIVITAMIN W/MINERALS CH
1.0000 | ORAL_TABLET | Freq: Every day | ORAL | Status: DC
  Administered 2016-11-25 – 2016-11-27 (×3): 1 via ORAL
  Filled 2016-11-24 (×3): qty 1

## 2016-11-24 MED ORDER — LOSARTAN POTASSIUM 50 MG PO TABS
150.0000 mg | ORAL_TABLET | Freq: Every day | ORAL | Status: DC
  Administered 2016-11-25 – 2016-11-27 (×3): 150 mg via ORAL
  Filled 2016-11-24 (×3): qty 3

## 2016-11-24 MED ORDER — MONTELUKAST SODIUM 10 MG PO TABS
10.0000 mg | ORAL_TABLET | Freq: Every day | ORAL | Status: DC
  Administered 2016-11-24 – 2016-11-26 (×3): 10 mg via ORAL
  Filled 2016-11-24 (×3): qty 1

## 2016-11-24 MED ORDER — ENOXAPARIN SODIUM 40 MG/0.4ML ~~LOC~~ SOLN
40.0000 mg | SUBCUTANEOUS | Status: DC
  Administered 2016-11-24 – 2016-11-25 (×2): 40 mg via SUBCUTANEOUS
  Filled 2016-11-24 (×2): qty 0.4

## 2016-11-24 MED ORDER — PANTOPRAZOLE SODIUM 40 MG PO TBEC
40.0000 mg | DELAYED_RELEASE_TABLET | Freq: Every day | ORAL | Status: DC
  Administered 2016-11-25 – 2016-11-27 (×3): 40 mg via ORAL
  Filled 2016-11-24 (×3): qty 1

## 2016-11-24 MED ORDER — POLYVINYL ALCOHOL 1.4 % OP SOLN
1.0000 [drp] | Freq: Three times a day (TID) | OPHTHALMIC | Status: DC
  Administered 2016-11-24 – 2016-11-27 (×7): 1 [drp] via OPHTHALMIC
  Filled 2016-11-24: qty 15

## 2016-11-24 MED ORDER — ACETAMINOPHEN 325 MG PO TABS: 650.0000 mg | ORAL_TABLET | Freq: Four times a day (QID) | ORAL | Status: DC | PRN

## 2016-11-24 MED ORDER — ALBUTEROL SULFATE (2.5 MG/3ML) 0.083% IN NEBU: 2.5000 mg | INHALATION_SOLUTION | Freq: Three times a day (TID) | RESPIRATORY_TRACT | Status: DC | PRN

## 2016-11-24 MED ORDER — SODIUM CHLORIDE 0.9% FLUSH
3.0000 mL | Freq: Two times a day (BID) | INTRAVENOUS | Status: DC
  Administered 2016-11-24 – 2016-11-25 (×2): 3 mL via INTRAVENOUS

## 2016-11-24 MED ORDER — ATORVASTATIN CALCIUM 10 MG PO TABS
10.0000 mg | ORAL_TABLET | ORAL | Status: DC
  Administered 2016-11-25 – 2016-11-26 (×2): 10 mg via ORAL
  Filled 2016-11-24 (×2): qty 1

## 2016-11-24 MED ORDER — HYDRALAZINE HCL 25 MG PO TABS
25.0000 mg | ORAL_TABLET | Freq: Three times a day (TID) | ORAL | Status: DC
  Administered 2016-11-24 – 2016-11-27 (×8): 25 mg via ORAL
  Filled 2016-11-24 (×8): qty 1

## 2016-11-24 MED ORDER — ACETAMINOPHEN 650 MG RE SUPP: 650.0000 mg | Freq: Four times a day (QID) | RECTAL | Status: DC | PRN

## 2016-11-24 MED ORDER — METOCLOPRAMIDE HCL 5 MG/ML IJ SOLN
10.0000 mg | Freq: Once | INTRAMUSCULAR | Status: AC
  Administered 2016-11-24: 10 mg via INTRAVENOUS
  Filled 2016-11-24: qty 2

## 2016-11-24 MED ORDER — TRAMADOL HCL 50 MG PO TABS: 50.0000 mg | ORAL_TABLET | Freq: Four times a day (QID) | ORAL | Status: DC | PRN

## 2016-11-24 MED ORDER — POTASSIUM CHLORIDE IN NACL 20-0.9 MEQ/L-% IV SOLN
INTRAVENOUS | Status: DC
  Administered 2016-11-24 – 2016-11-25 (×2): via INTRAVENOUS
  Administered 2016-11-26: 1000 mL via INTRAVENOUS
  Administered 2016-11-26 – 2016-11-27 (×2): via INTRAVENOUS
  Filled 2016-11-24 (×7): qty 1000

## 2016-11-24 MED ORDER — VITAMIN B-12 1000 MCG PO TABS
1000.0000 ug | ORAL_TABLET | Freq: Every day | ORAL | Status: DC
  Administered 2016-11-25 – 2016-11-27 (×3): 1000 ug via ORAL
  Filled 2016-11-24 (×3): qty 1

## 2016-11-24 MED ORDER — FERROUS SULFATE 325 (65 FE) MG PO TABS
325.0000 mg | ORAL_TABLET | Freq: Two times a day (BID) | ORAL | Status: DC
  Administered 2016-11-25 – 2016-11-27 (×5): 325 mg via ORAL
  Filled 2016-11-24 (×5): qty 1

## 2016-11-24 MED ORDER — ACETAMINOPHEN 500 MG PO TABS
1000.0000 mg | ORAL_TABLET | Freq: Three times a day (TID) | ORAL | Status: DC
  Administered 2016-11-24 – 2016-11-27 (×8): 1000 mg via ORAL
  Filled 2016-11-24 (×8): qty 2

## 2016-11-24 MED ORDER — CALAZIME SKIN PROTECTANT EX PSTE: 1.0000 "application " | PASTE | Freq: Two times a day (BID) | CUTANEOUS | Status: DC

## 2016-11-24 MED ORDER — FLUTICASONE PROPIONATE 50 MCG/ACT NA SUSP
2.0000 | Freq: Every day | NASAL | Status: DC
  Administered 2016-11-24 – 2016-11-26 (×3): 2 via NASAL
  Filled 2016-11-24 (×2): qty 16

## 2016-11-24 MED ORDER — VITAMIN D 1000 UNITS PO TABS
1000.0000 [IU] | ORAL_TABLET | Freq: Every day | ORAL | Status: DC
  Administered 2016-11-25 – 2016-11-27 (×3): 1000 [IU] via ORAL
  Filled 2016-11-24 (×5): qty 1

## 2016-11-24 MED ORDER — SODIUM CHLORIDE 0.9 % IV BOLUS (SEPSIS)
500.0000 mL | Freq: Once | INTRAVENOUS | Status: AC
  Administered 2016-11-24: 500 mL via INTRAVENOUS

## 2016-11-24 MED ORDER — VITAMIN C 500 MG PO TABS
250.0000 mg | ORAL_TABLET | Freq: Every day | ORAL | Status: DC
  Administered 2016-11-25 – 2016-11-27 (×3): 250 mg via ORAL
  Filled 2016-11-24 (×3): qty 1

## 2016-11-24 MED ORDER — POLYETHYLENE GLYCOL 3350 17 G PO PACK: 17.0000 g | PACK | Freq: Every day | ORAL | Status: DC | PRN

## 2016-11-24 MED ORDER — ALBUTEROL SULFATE (2.5 MG/3ML) 0.083% IN NEBU: 3.0000 mL | INHALATION_SOLUTION | Freq: Four times a day (QID) | RESPIRATORY_TRACT | Status: DC | PRN

## 2016-11-24 MED ORDER — DIMETHICONE 1 % EX CREA
TOPICAL_CREAM | Freq: Two times a day (BID) | CUTANEOUS | Status: DC
  Administered 2016-11-24 – 2016-11-27 (×6): via TOPICAL
  Filled 2016-11-24: qty 113

## 2016-11-24 MED ORDER — ONDANSETRON HCL 4 MG/2ML IJ SOLN
4.0000 mg | Freq: Once | INTRAMUSCULAR | Status: AC | PRN
  Administered 2016-11-24: 4 mg via INTRAVENOUS
  Filled 2016-11-24: qty 2

## 2016-11-24 MED ORDER — DILTIAZEM HCL ER COATED BEADS 120 MG PO CP24
120.0000 mg | ORAL_CAPSULE | Freq: Every day | ORAL | Status: DC
  Administered 2016-11-25 – 2016-11-27 (×3): 120 mg via ORAL
  Filled 2016-11-24 (×5): qty 1

## 2016-11-24 MED ORDER — MOMETASONE FURO-FORMOTEROL FUM 200-5 MCG/ACT IN AERO
2.0000 | INHALATION_SPRAY | Freq: Two times a day (BID) | RESPIRATORY_TRACT | Status: DC
  Administered 2016-11-24 – 2016-11-27 (×5): 2 via RESPIRATORY_TRACT
  Filled 2016-11-24: qty 8.8

## 2016-11-24 NOTE — ED Notes (Signed)
Attempted in & out patient unsuccessful patient placed on bedpan to try and urinate.

## 2016-11-24 NOTE — ED Provider Notes (Signed)
[facility] DEPT Provider Note   CSN: [id number] Arrival date & time: 11/24/16  0803     History   Chief Complaint Chief Complaint  Patient presents with  . Fall    HPI Jasmin Lee is a 83 y.o. female.  HPI Patient presents to the emergency department with injuries following a fall along with the fact that she states that she has been somewhat weak over the last couple weeks.  The patient has caregivers 24 hours today.  They states she landed on her bottom when she fell yesterday.  She did not hit her head.  Patient states nothing seems make her condition better or worse, states from the first fall she had 2 weeks ago she has had some pain in her bottom.  Patient uses a walker at home. The patient denies chest pain, shortness of breath, headache,blurred vision, neck pain, fever, cough, weakness, numbness, dizziness, anorexia, edema, abdominal pain, nausea, vomiting, diarrhea, rash, back pain, dysuria, hematemesis, bloody stool, near syncope, or syncope. Past Medical History:  Diagnosis Date  . Anemia   . Arthritis    "in my legs and feet" (05/11/2014)  . Cardiomyopathy ([place])   . Chronic bronchitis ([place]) "2-3 times/yr"  . Complication of anesthesia    "started flinching at start of hysterectomy; they had to give me more anesthetic"  . COPD with asthma ([place])   . Dyslipidemia   . Echocardiogram abnormal 12/05/2010   Moderate concentric left ventricular hypertrophy EF > 69% stage I diastolic dysfunction, left atrium mild to moderate dilatation, moderate mitral annular calcification with trace MR. Trace TR.  Marland Kitchen HTN (hypertension)   . Pneumonia "several times"  . Squamous carcinoma    "left face and thigh"  . Uterine cancer [facility])     Patient Active Problem List   Diagnosis Date Noted  . Sacral fracture, closed ([place]) 10/12/2016  . Physical deconditioning   . Left hip pain 10/05/2016  . Anemia due to chronic kidney disease   . Extrinsic asthma 05/12/2014  . Weakness  06/21/2013  . Hyponatremia 06/11/2013  . COPD (chronic obstructive pulmonary disease) ([place]) 06/11/2013  . Hypokalemia 06/11/2013  . Anemia 06/11/2013  . Essential hypertension   . Dyslipidemia     Past Surgical History:  Procedure Laterality Date  . APPENDECTOMY  1954  . BREAST BIOPSY Left    "it was nothing"  . CARDIAC EVENT MONITOR  12/05/10-01/02/11   SINUS RHYTHM  . CATARACT EXTRACTION W/ INTRAOCULAR LENS  IMPLANT, BILATERAL Bilateral   . CHOLECYSTECTOMY  2003  . DILATION AND CURETTAGE OF UTERUS  1966  . SQUAMOUS CELL CARCINOMA EXCISION Left    face and thigh  . TRANSTHORACIC ECHOCARDIOGRAM  December 05, 2010   moderate cocentric LVH WITH STAGE 1 IMPARIED DIASTOLIC DYSFUNCTION , NORMAL EF AND NORMAL LV PRESSURES. LEFT ATRIUM WAS ONLY MILD TO MODERATELY DILATED .MOD MITRAL CALCIFICATION WITH ONLY MILD REGURGITATION.,OTHERWISE RELATIVELY NORMAL  . VAGINAL HYSTERECTOMY  1966    OB History    No data available       Home Medications    Prior to Admission medications   Medication Sig Start Date End Date Taking? Authorizing Provider  acetaminophen (TYLENOL) 500 MG tablet Take 1,000 mg by mouth 3 (three) times daily.    Yes [provider]  albuterol (PROVENTIL HFA;VENTOLIN HFA) 108 (90 Base) MCG/ACT inhaler Inhale 2 puffs into the lungs every 6 (six) hours as needed for wheezing or shortness of breath.   Yes [provider]  albuterol (  PROVENTIL) (2.5 MG/3ML) 0.083% nebulizer solution Take 2.5 mg by nebulization every 8 (eight) hours as needed for wheezing or shortness of breath.    Yes [provider]  atorvastatin (LIPITOR) 10 MG tablet Take 10 mg by mouth every other day.  01/30/13  Yes [provider]  Cholecalciferol 1000 units tablet Take 1,000 Units by mouth daily.   Yes [provider]  cyanocobalamin 1000 MCG tablet Take 1,000 mcg by mouth daily with breakfast.   Yes [provider]  diclofenac sodium (VOLTAREN) 1 % GEL  Apply 1 application topically daily.  07/24/16  Yes [provider]  DILT-XR 120 MG 24 hr capsule TAKE 1 CAPSULE DAILY 09/01/16  Yes [name], MD  fluticasone [facility]) 50 MCG/ACT nasal spray Place 2 sprays into both nostrils at bedtime.  07/15/16  Yes [provider]  Fluticasone-Salmeterol (ADVAIR) 250-50 MCG/DOSE AEPB Inhale 1 puff into the lungs 2 (two) times daily.   Yes [provider]  furosemide (LASIX) 20 MG tablet Take 1 tablet (20 mg total) by mouth daily. One tab Monday, Wednesday, Friday and Saturday Patient taking differently: Take 20 mg by mouth daily. One tab Monday, Wednesday, Friday 10/22/16 01/20/17 Yes [name], MD  hydrALAZINE (APRESOLINE) 10 MG tablet TAKE 5 TABLETS DAILY (2 TABLETS IN THE MORNING, 1 TABLET IN THE AFTERNOON AND 2 TABLETS IN THE EVENING) 11/16/16  Yes [name], MD  losartan (COZAAR) 50 MG tablet Take 3 tablets (150 mg total) by mouth daily. Patient taking differently: Take 50-100 mg by mouth 2 (two) times daily. 100 mg in the morning and 50 mg at bedtime 10/09/16  Yes [name], MD  Menthol, Topical Analgesic, (BIOFREEZE) 4 % GEL Apply 1 application topically 3 (three) times daily.   Yes [provider]  montelukast (SINGULAIR) 10 MG tablet Take 10 mg by mouth at bedtime.  12/03/12  Yes [provider]  Multiple Vitamin (MULTIVITAMIN WITH MINERALS) TABS tablet Take 1 tablet by mouth daily with breakfast.    Yes [provider]  pantoprazole (PROTONIX) 40 MG tablet Take 1 tablet (40 mg total) by mouth daily. 06/13/13  Yes [name], MD  polyethylene glycol [facility] / Floria Raveling) packet Take 17 g by mouth daily as needed for mild constipation. 10/08/16  Yes [name], MD  polyvinyl alcohol (ARTIFICIAL TEARS) 1.4 % ophthalmic solution Place 1 drop into both eyes 3 (three) times daily.   Yes [provider]  Skin Protectants, Misc. (CALAZIME SKIN PROTECTANT EX) Apply topically to  Buttocks two times daily for wound care   Yes [provider]  traMADol (ULTRAM) 50 MG tablet Take 1 tablet (50 mg total) by mouth every 6 (six) hours as needed for moderate pain. 10/08/16  Yes [name], MD  vitamin C (ASCORBIC ACID) 250 MG tablet Take 250 mg by mouth daily.   Yes [provider]  Wound Cleansers (WOUND CLEANSER EX) Cleanse left elbow daily   Yes [provider]    Family History Family History  Problem Relation Age of Onset  . Heart attack Mother   . Cancer Father   . Heart attack Maternal Grandmother   . Heart disease Son   . Asthma Daughter     Social History Social History  Substance Use Topics  . Smoking status: Never Smoker  . Smokeless tobacco: Never Used  . Alcohol use No     Allergies   Almond (diagnostic); Asa [aspirin]; Coconut flavor; Neomycin; Peanut-containing drug products;  Penicillins; Sulfa antibiotics; Tizanidine; Lisinopril; and Keflex [cephalexin]   Review of Systems Review of Systems  All other systems negative except as documented in the HPI. All pertinent positives and negatives as reviewed in the HPI. Physical Exam Updated Vital Signs BP (!) 145/54   Pulse 61   SpO2 99%   Physical Exam  Constitutional: She is oriented to person, place, and time. She appears well-developed and well-nourished. No distress.  HENT:  Head: Normocephalic and atraumatic.  Mouth/Throat: Oropharynx is clear and moist.  Eyes: Pupils are equal, round, and reactive to light.  Neck: Normal range of motion. Neck supple.  Cardiovascular: Normal rate, regular rhythm and normal heart sounds.  Exam reveals no gallop and no friction rub.   No murmur heard. Pulmonary/Chest: Effort normal and breath sounds normal. No respiratory distress. She has no wheezes.  Abdominal: Soft. Bowel sounds are normal. She exhibits no distension. There is no tenderness.  Neurological: She is alert and oriented to person, place, and time. She exhibits  normal muscle tone. Coordination normal.  Skin: Skin is warm and dry. Capillary refill takes less than 2 seconds. No rash noted. No erythema.  Psychiatric: She has a normal mood and affect. Her behavior is normal.  Nursing note and vitals reviewed.    ED Treatments / Results  Labs (all labs ordered are listed, but only abnormal results are displayed) Labs Reviewed  BASIC METABOLIC PANEL - Abnormal; Notable for the following:       Result Value   Sodium 115 (*)    Potassium 3.3 (*)    Chloride 80 (*)    Calcium 8.2 (*)    All other components within normal limits  CBC WITH DIFFERENTIAL/PLATELET - Abnormal; Notable for the following:    RBC 3.81 (*)    Hemoglobin 9.6 (*)    HCT 28.2 (*)    MCV 74.0 (*)    MCH 25.2 (*)    RDW 17.2 (*)    All other components within normal limits    EKG  EKG Interpretation None       Radiology Dg Lumbar Spine Complete  Result Date: 11/24/2016 CLINICAL DATA:  Back and right leg pain, recent fall EXAM: LUMBAR SPINE - COMPLETE 4+ VIEW COMPARISON:  Lumbar spine films of 11/12/2016 FINDINGS: No acute compression deformity is seen. Diffuse degenerative disc disease throughout the entire lumbar spine is again noted. The bones are osteopenic. Abdominal aortic atherosclerosis again is noted. IMPRESSION: 1. No acute compression deformity. 2. Diffuse degenerative disc disease and osteopenia. 3. Moderate abdominal aortic atherosclerosis. Electronically Signed   By: Ivar Drape M.D.   On: 11/24/2016 11:01   Dg Pelvis 1-2 Views  Result Date: 11/24/2016 CLINICAL DATA:  Back and right leg pain, recent fall EXAM: PELVIS - 1-2 VIEW COMPARISON:  Pelvis film of 11/12/2016 FINDINGS: No acute fracture is seen. There is degenerative change of both hips. The bones are diffusely osteopenic. IMPRESSION: No acute fracture.  Degenerative change of the hips.  Osteopenia. Electronically Signed   By: Ivar Drape M.D.   On: 11/24/2016 11:03   Dg Sacrum/coccyx  Result Date:  11/24/2016 CLINICAL DATA:  Back and right leg pain, recent fall EXAM: SACRUM AND COCCYX - 2+ VIEW COMPARISON:  Pelvis film of 11/12/2016 FINDINGS: The sacrococcygeal elements are in normal alignment. No compression deformity is seen. The bones are diffusely osteopenic. The sacral foramina appear intact. IMPRESSION: No acute fracture.  Diffuse osteopenia. Electronically Signed   By: Windy Canny.D.  On: 11/24/2016 11:02   Ct Pelvis Wo Contrast  Result Date: 11/24/2016 CLINICAL DATA:  Fall, pelvic pain EXAM: CT PELVIS WITHOUT CONTRAST TECHNIQUE: Multidetector CT imaging of the pelvis was performed following the standard protocol without intravenous contrast. COMPARISON:  11/24/2016 FINDINGS: Urinary Tract: Urinary bladder is mildly distended. Distal ureters are decompressed and difficult to follow. Bowel: Sigmoid diverticulosis. No active diverticulitis. Mild gaseous distention of the colon. Vascular/Lymphatic: Diffuse aortic and iliac calcifications. No aneurysm or adenopathy. Reproductive:  Prior hysterectomy.  No adnexal masses. Other:  No free fluid or free air. Musculoskeletal: No fracture, subluxation or dislocation. IMPRESSION: No acute bony abnormality in the pelvis. Aorta iliac atherosclerosis. Sigmoid diverticulosis. Electronically Signed   By: Rolm Baptise M.D.   On: 11/24/2016 13:32    Procedures Procedures (including critical care time)  Medications Ordered in ED Medications  ondansetron (ZOFRAN) injection 4 mg (4 mg Intravenous Given 11/24/16 0827)  metoCLOPramide (REGLAN) injection 10 mg (10 mg Intravenous Given 11/24/16 0923)     Initial Impression / Assessment and Plan / ED Course  I have reviewed the triage vital signs and the nursing notes.  Pertinent labs & imaging results that were available during my care of the patient were reviewed by me and considered in my medical decision making (see chart for details).     The patient will be admitted for hyponatremia along with her  weakness.  The patient has been stable here in the emergency department.  I did talk with [facility] hospitalists, who will evaluate the patient for admission.  CRITICAL CARE Performed by: Brent General Total critical care time: 30 minutes Critical care time was exclusive of separately billable procedures and treating other patients. Critical care was necessary to treat or prevent imminent or life-threatening deterioration. Critical care was time spent personally by me on the following activities: development of treatment plan with patient and/or surrogate as well as nursing, discussions with consultants, evaluation of patient's response to treatment, examination of patient, obtaining history from patient or surrogate, ordering and performing treatments and interventions, ordering and review of laboratory studies, ordering and review of radiographic studies, pulse oximetry and re-evaluation of patient's condition.  Patient is requiring admission for a sodium of 115 due to weakness and falls.  She was given IV fluids.  Final Clinical Impressions(s) / ED Diagnoses   Final diagnoses:  Hyponatremia  Fall, initial encounter    New Prescriptions New Prescriptions   No medications on file     Dalia Heading, Hershal Coria 11/24/16 [place], Martha, MD 11/24/16 1622

## 2016-11-24 NOTE — ED Notes (Signed)
Patient resting at this time. No compalints

## 2016-11-24 NOTE — ED Notes (Signed)
PA aware patient unable to bear weight.

## 2016-11-24 NOTE — Progress Notes (Signed)
CRITICAL VALUE ALERT  Critical Value:  Sodium 116   Date & Time Notied:  11/24/16 2355   Provider Notified: 0001 Baltazar Najjar  Orders Received/Actions taken: No response from MD

## 2016-11-24 NOTE — H&P (Addendum)
TRH H&P   Patient Demographics:    Jasmin Lee, is a 83 y.o. female  MRN: [id number]   DOB - [birth year]  Admit Date - 11/24/2016  Outpatient Primary MD for the patient is [name], MD  Referring MD: Dr. Canary Brim  Outpatient Specialists: Dr. [name]    Patient coming from: Home  Chief Complaint  Patient presents with  . Fall      HPI:    Jasmin Lee  is a 83 y.o. female, With a history of hypertension, palpitations, B12 deficiency, COPD with asthma, chronic hyponatremia, and frequent falls in the past several weeks (hospitalized 6 weeks back for sacral fracture and then visited ED 10 days back with fall at home without sustaining any injury) was brought to the ED by her caregiver after she fell in the bathroom and landed in her buttocks. Patient reports using her walker and going to the bathroom yesterday when she slipped and landed on her buttocks. She started having pain in her tailbone worsened on movement. She has been having some pain around the area when she sustained a similar fall 10 days back and was seen in the ED. She denies hitting her head or loss of consciousness. Denies sustaining injury anywhere else. She denies any fever, chills, headache, blurred vision, dizziness, lightheadedness, nausea, vomiting, chest, shortness of breath, palpitations, abdominal pain, dysuria or diarrhea. Denies any confusion or seizures. She reports for past few weeks he has been having poor appetite and has not been drinking enough water. Denies any new medications.  Patient was hospitalized from 5/14-5/17 after sustaining a fall and had a sacral fracture. She was treated with pain control and discharged to skilled nursing facility. During that time she was found to have sodium of 114 and suspecting that HCTZ may have contributed to it it was discontinued. Her sodium had improved to  123 with normal saline.  Course in the ED She had elevated blood pressure, vitals otherwise stable. Blood work showed a normal WBC, heparin of 9.6, sodium of 115, potassium 3.3, chloride of 80 normal renal function. X-ray and CT of the pelvis negative for any factory or acute abnormality.  Patient given 500 mL normal saline bolus, Zofran and Reglan and hospitalist admission requested to telemetry.   Review of systems:    In addition to the HPI above,  No Fever-chills, No Headache, No changes with Vision or hearing, No problems swallowing food or Liquids, No Chest pain, Cough or Shortness of Breath, No Abdominal pain, No Nausea or Vomiting, Bowel movements are regular, No Blood in stool or Urine, No dysuria, No new skin rashes or bruises, No new joints pains-aches, pain in her tailbone+++ Generalized weakness++,, tingling, numbness in any extremity, No recent weight gain or loss, No polyuria, polydypsia or polyphagia, No significant Mental Stressors.  A full 10 point Review of Systems was done, except as stated  above, all other Review of Systems were negative.   With Past History of the following :    Past Medical History:  Diagnosis Date  . Anemia   . Arthritis    "in my legs and feet" (05/11/2014)  . Cardiomyopathy ([place])   . Chronic bronchitis ([place]) "2-3 times/yr"  . Complication of anesthesia    "started flinching at start of hysterectomy; they had to give me more anesthetic"  . COPD with asthma ([place])   . Dyslipidemia   . Echocardiogram abnormal 12/05/2010   Moderate concentric left ventricular hypertrophy EF > 68% stage I diastolic dysfunction, left atrium mild to moderate dilatation, moderate mitral annular calcification with trace MR. Trace TR.  Marland Kitchen HTN (hypertension)   . Pneumonia "several times"  . Squamous carcinoma    "left face and thigh"  . Uterine cancer [facility])       Past Surgical History:  Procedure Laterality Date  . APPENDECTOMY  1954  . BREAST BIOPSY  Left    "it was nothing"  . CARDIAC EVENT MONITOR  12/05/10-01/02/11   SINUS RHYTHM  . CATARACT EXTRACTION W/ INTRAOCULAR LENS  IMPLANT, BILATERAL Bilateral   . CHOLECYSTECTOMY  2003  . DILATION AND CURETTAGE OF UTERUS  1966  . SQUAMOUS CELL CARCINOMA EXCISION Left    face and thigh  . TRANSTHORACIC ECHOCARDIOGRAM  December 05, 2010   moderate cocentric LVH WITH STAGE 1 IMPARIED DIASTOLIC DYSFUNCTION , NORMAL EF AND NORMAL LV PRESSURES. LEFT ATRIUM WAS ONLY MILD TO MODERATELY DILATED .MOD MITRAL CALCIFICATION WITH ONLY MILD REGURGITATION.,OTHERWISE RELATIVELY NORMAL  . VAGINAL HYSTERECTOMY  1966      Social History:     Social History  Substance Use Topics  . Smoking status: Never Smoker  . Smokeless tobacco: Never Used  . Alcohol use No     Lives - At home and has 24-hour caregiver.  Mobility - uses walker to ambulate    Family History :     Family History  Problem Relation Age of Onset  . Heart attack Mother   . Cancer Father   . Heart attack Maternal Grandmother   . Heart disease Son   . Asthma Daughter       Home Medications:   Prior to Admission medications   Medication Sig Start Date End Date Taking? Authorizing Provider  acetaminophen (TYLENOL) 500 MG tablet Take 1,000 mg by mouth 3 (three) times daily.    Yes [provider]  albuterol (PROVENTIL HFA;VENTOLIN HFA) 108 (90 Base) MCG/ACT inhaler Inhale 2 puffs into the lungs every 6 (six) hours as needed for wheezing or shortness of breath.   Yes [provider]  albuterol (PROVENTIL) (2.5 MG/3ML) 0.083% nebulizer solution Take 2.5 mg by nebulization every 8 (eight) hours as needed for wheezing or shortness of breath.    Yes [provider]  atorvastatin (LIPITOR) 10 MG tablet Take 10 mg by mouth every other day.  01/30/13  Yes [provider]  Cholecalciferol 1000 units tablet Take 1,000 Units by mouth daily.   Yes [provider]  cyanocobalamin 1000 MCG tablet Take  1,000 mcg by mouth daily with breakfast.   Yes [provider]  diclofenac sodium (VOLTAREN) 1 % GEL Apply 1 application topically daily.  07/24/16  Yes [provider]  DILT-XR 120 MG 24 hr capsule TAKE 1 CAPSULE DAILY 09/01/16  Yes [name], MD  fluticasone [facility]) 50 MCG/ACT nasal spray Place 2 sprays into both nostrils at bedtime.  07/15/16  Yes [provider]  Fluticasone-Salmeterol (ADVAIR) 250-50 MCG/DOSE AEPB Inhale 1 puff into the lungs 2 (two) times daily.   Yes [provider]  furosemide (LASIX) 20 MG tablet Take 1 tablet (20 mg total) by mouth daily. One tab Monday, Wednesday, Friday and Saturday Patient taking differently: Take 20 mg by mouth daily. One tab Monday, Wednesday, Friday 10/22/16 01/20/17 Yes [name], MD  hydrALAZINE (APRESOLINE) 10 MG tablet TAKE 5 TABLETS DAILY (2 TABLETS IN THE MORNING, 1 TABLET IN THE AFTERNOON AND 2 TABLETS IN THE EVENING) 11/16/16  Yes [name], MD  losartan (COZAAR) 50 MG tablet Take 3 tablets (150 mg total) by mouth daily. Patient taking differently: Take 50-100 mg by mouth 2 (two) times daily. 100 mg in the morning and 50 mg at bedtime 10/09/16  Yes [name], MD  Menthol, Topical Analgesic, (BIOFREEZE) 4 % GEL Apply 1 application topically 3 (three) times daily.   Yes [provider]  montelukast (SINGULAIR) 10 MG tablet Take 10 mg by mouth at bedtime.  12/03/12  Yes [provider]  Multiple Vitamin (MULTIVITAMIN WITH MINERALS) TABS tablet Take 1 tablet by mouth daily with breakfast.    Yes [provider]  pantoprazole (PROTONIX) 40 MG tablet Take 1 tablet (40 mg total) by mouth daily. 06/13/13  Yes [name], MD  polyethylene glycol [facility] Floria Raveling) packet Take 17 g by mouth daily as needed for mild constipation. 10/08/16  Yes [name], MD  polyvinyl alcohol (ARTIFICIAL TEARS) 1.4 % ophthalmic solution Place 1 drop into both eyes 3 (three) times  daily.   Yes [provider]  Skin Protectants, Misc. (CALAZIME SKIN PROTECTANT EX) Apply topically to Buttocks two times daily for wound care   Yes [provider]  traMADol (ULTRAM) 50 MG tablet Take 1 tablet (50 mg total) by mouth every 6 (six) hours as needed for moderate pain. 10/08/16  Yes [name], MD  vitamin C (ASCORBIC ACID) 250 MG tablet Take 250 mg by mouth daily.   Yes [provider]  Wound Cleansers (WOUND CLEANSER EX) Cleanse left elbow daily   Yes [provider]     Allergies:     Allergies  Allergen Reactions  . Almond (Diagnostic) Shortness Of Breath  . Asa [Aspirin] Shortness Of Breath and Other (See Comments)    On high doses of ASA  . Coconut Flavor Shortness Of Breath  . Neomycin Shortness Of Breath  . Peanut-Containing Drug Products Shortness Of Breath  . Penicillins Shortness Of Breath and Other (See Comments)    Has patient had a PCN reaction causing immediate rash, facial/tongue/throat swelling, SOB or lightheadedness with hypotension: Yes Has patient had a PCN reaction causing severe rash involving mucus membranes or skin necrosis: No Has patient had a PCN reaction that required hospitalization No Has patient had a PCN reaction occurring within the last 10 years: No If all of the above answers are "NO", then may proceed with Cephalosporin use.   . Sulfa Antibiotics Shortness Of Breath  . Tizanidine Shortness Of Breath  . Lisinopril Cough  . Keflex [Cephalexin] Rash     Physical Exam:   Vitals  Blood pressure (!) 153/74, pulse 70, SpO2 98 %.  Gen.: Elderly female lying in bed, appears fatigued HEENT: Pupils reactive bilaterally, mild temporal wasting, no pallor, no icterus, moist mucosa, supple neck Chest: Clear to auscultation bilaterally CVS: S1 and S2 regular, systolic murmur 2/6 GI: Soft, nondistended, nontender, bowel sounds present Musculoskeletal:  Warm, no edema, some limitation in mobility of the  hip due to pain in her buttocks CNS: Alert and oriented, nonfocal   Data Review:    CBC  Recent Labs Lab 11/24/16 1330  WBC 6.5  HGB 9.6*  HCT 28.2*  PLT 289  MCV 74.0*  MCH 25.2*  MCHC 34.0  RDW 17.2*  LYMPHSABS 1.5  MONOABS 0.5  EOSABS 0.1  BASOSABS 0.0   ------------------------------------------------------------------------------------------------------------------  Chemistries   Recent Labs Lab 11/24/16 1330  NA 115*  K 3.3*  CL 80*  CO2 26  GLUCOSE 97  BUN 8  CREATININE 0.76  CALCIUM 8.2*   ------------------------------------------------------------------------------------------------------------------ CrCl cannot be calculated (Unknown ideal weight.). ------------------------------------------------------------------------------------------------------------------ No results for input(s): TSH, T4TOTAL, T3FREE, THYROIDAB in the last 72 hours.  Invalid input(s): FREET3  Coagulation profile No results for input(s): INR, PROTIME in the last 168 hours. ------------------------------------------------------------------------------------------------------------------- No results for input(s): DDIMER in the last 72 hours. -------------------------------------------------------------------------------------------------------------------  Cardiac Enzymes No results for input(s): CKMB, TROPONINI, MYOGLOBIN in the last 168 hours.  Invalid input(s): CK ------------------------------------------------------------------------------------------------------------------ No results found for: BNP   ---------------------------------------------------------------------------------------------------------------  Urinalysis    Component Value Date/Time   COLORURINE YELLOW 10/05/2016 1137   APPEARANCEUR CLOUDY (A) 10/05/2016 1137   LABSPEC 1.010 10/05/2016 1137   PHURINE 7.0 10/05/2016 1137   GLUCOSEU NEGATIVE 10/05/2016 1137   HGBUR SMALL (A) 10/05/2016 1137    BILIRUBINUR NEGATIVE 10/05/2016 1137   KETONESUR NEGATIVE 10/05/2016 1137   PROTEINUR 30 (A) 10/05/2016 1137   UROBILINOGEN 0.2 06/11/2013 1951   NITRITE NEGATIVE 10/05/2016 1137   LEUKOCYTESUR SMALL (A) 10/05/2016 1137    ----------------------------------------------------------------------------------------------------------------   Imaging Results:    Dg Lumbar Spine Complete  Result Date: 11/24/2016 CLINICAL DATA:  Back and right leg pain, recent fall EXAM: LUMBAR SPINE - COMPLETE 4+ VIEW COMPARISON:  Lumbar spine films of 11/12/2016 FINDINGS: No acute compression deformity is seen. Diffuse degenerative disc disease throughout the entire lumbar spine is again noted. The bones are osteopenic. Abdominal aortic atherosclerosis again is noted. IMPRESSION: 1. No acute compression deformity. 2. Diffuse degenerative disc disease and osteopenia. 3. Moderate abdominal aortic atherosclerosis. Electronically Signed   By: Ivar Drape M.D.   On: 11/24/2016 11:01   Dg Pelvis 1-2 Views  Result Date: 11/24/2016 CLINICAL DATA:  Back and right leg pain, recent fall EXAM: PELVIS - 1-2 VIEW COMPARISON:  Pelvis film of 11/12/2016 FINDINGS: No acute fracture is seen. There is degenerative change of both hips. The bones are diffusely osteopenic. IMPRESSION: No acute fracture.  Degenerative change of the hips.  Osteopenia. Electronically Signed   By: Ivar Drape M.D.   On: 11/24/2016 11:03   Dg Sacrum/coccyx  Result Date: 11/24/2016 CLINICAL DATA:  Back and right leg pain, recent fall EXAM: SACRUM AND COCCYX - 2+ VIEW COMPARISON:  Pelvis film of 11/12/2016 FINDINGS: The sacrococcygeal elements are in normal alignment. No compression deformity is seen. The bones are diffusely osteopenic. The sacral foramina appear intact. IMPRESSION: No acute fracture.  Diffuse osteopenia. Electronically Signed   By: Ivar Drape M.D.   On: 11/24/2016 11:02   Ct Pelvis Wo Contrast  Result Date: 11/24/2016 CLINICAL DATA:  Fall,  pelvic pain EXAM: CT PELVIS WITHOUT CONTRAST TECHNIQUE: Multidetector CT imaging of the pelvis was performed following the standard protocol without intravenous contrast. COMPARISON:  11/24/2016 FINDINGS: Urinary Tract: Urinary bladder is mildly distended. Distal ureters are decompressed and difficult to follow. Bowel: Sigmoid diverticulosis. No active diverticulitis. Mild gaseous distention of the colon. Vascular/Lymphatic: Diffuse  aortic and iliac calcifications. No aneurysm or adenopathy. Reproductive:  Prior hysterectomy.  No adnexal masses. Other:  No free fluid or free air. Musculoskeletal: No fracture, subluxation or dislocation. IMPRESSION: No acute bony abnormality in the pelvis. Aorta iliac atherosclerosis. Sigmoid diverticulosis. Electronically Signed   By: Rolm Baptise M.D.   On: 11/24/2016 13:32    My personal review of EKG: Pending  Assessment & Plan:    Principal Problem:   Hyponatremia, Acute on chronic On reviewing prior labs patient appears to have chronic hyponatremia, asymptomatic. For reasons unclear. When she was hospitalized 6 weeks back and found to have sodium of 114, it was attribute it to HCTZ and it was discontinued. Patient reports that she does not drink enough water. Check urine and serum osmolality, UA, urine sodium, TSH and uric acid. Check random cortisol.. Since patient has chronic hyponatremia and levels seem to have worsened and not having any symptoms she can be corrected with IV normal saline at 75 mL/h (rate of correction not >10-12 meq/ 24 hrs). Check serum sodium level every 8 hours. Patient not on any medications right now that can cause hyponatremia.  Consult nephrology if sodium not corrected appropriately oriented etiology of hyponatremia not clear on urine studies.  Active Problems: Left hip pain Secondary to fall. X-ray of the pelvis negative for fracture or acute injury. I would place her on Tylenol and tramadol as needed for pain. PT evaluation.     Essential hypertension Elevated blood pressure. Possibly worsened by pain. Resume her Cardizem and ARB.    COPD (chronic obstructive pulmonary disease) (HCC) Stable. Continue home inhalers.    Hypokalemia Replenish   Iron deficiency anemia Add supplement.  B12 deficiency Continue cyanocobalamin.    Physical deconditioning PT evaluation.     DVT Prophylaxis: Lovenox  AM Labs Ordered, also please review Full Orders  Family Communication: Admission, patients condition and plan of care including tests being ordered have been discussed with the patient and her caregiver at bedside.Patient lives alone in [place] 24 hours caregiver. She  has a daughter who lives in [place] and a son who lives in [place].  Code Status DO NOT RESUSCITATE(Confirmed with the patient)  Likely DC to  home  Condition fair  Consults called: None   Admission status: Inpatient   Time spent in minutes : 50   Louellen Molder M.D on 11/24/2016 at 4:47 PM  Between 7am to 7pm - Pager - [phone]. After 7pm go to [email or web address] - password [facility]  [facility] - Office  [phone]

## 2016-11-24 NOTE — ED Notes (Signed)
PA aware of Sodium level.

## 2016-11-24 NOTE — ED Triage Notes (Signed)
Patient present today with complaints of  Back pain and right leg pain. Patient reports she fell last night at 1800 due to loss of balance. Patient denies any sensation changes. Patient able to move bilateral feel 2+ pedal pulses bilateral. Patient complaints of nausea. Patient given 228mcg of fentanyl and 4mg  Zofran.

## 2016-11-25 ENCOUNTER — Encounter (HOSPITAL_COMMUNITY): Payer: Self-pay

## 2016-11-25 DIAGNOSIS — W19XXXA Unspecified fall, initial encounter: Secondary | ICD-10-CM

## 2016-11-25 LAB — BASIC METABOLIC PANEL
ANION GAP: 9 (ref 5–15)
Anion gap: 5 (ref 5–15)
BUN: 8 mg/dL (ref 6–20)
BUN: 8 mg/dL (ref 6–20)
CALCIUM: 7.9 mg/dL — AB (ref 8.9–10.3)
CALCIUM: 7.8 mg/dL — AB (ref 8.9–10.3)
CO2: 22 mmol/L (ref 22–32)
CO2: 24 mmol/L (ref 22–32)
CREATININE: 0.76 mg/dL (ref 0.44–1.00)
CREATININE: 0.81 mg/dL (ref 0.44–1.00)
Chloride: 88 mmol/L — ABNORMAL LOW (ref 101–111)
Chloride: 88 mmol/L — ABNORMAL LOW (ref 101–111)
GLUCOSE: 86 mg/dL (ref 65–99)
GLUCOSE: 104 mg/dL — AB (ref 65–99)
Potassium: 3.8 mmol/L (ref 3.5–5.1)
Potassium: 3.6 mmol/L (ref 3.5–5.1)
Sodium: 119 mmol/L — CL (ref 135–145)
Sodium: 117 mmol/L — CL (ref 135–145)

## 2016-11-25 LAB — T4, FREE: Free T4: 1.04 ng/dL (ref 0.61–1.12)

## 2016-11-25 MED ORDER — BOOST / RESOURCE BREEZE PO LIQD
1.0000 | Freq: Two times a day (BID) | ORAL | Status: DC
Start: 2016-11-25 — End: 2016-11-27
  Administered 2016-11-25: 1 via ORAL

## 2016-11-25 NOTE — Evaluation (Signed)
Physical Therapy Evaluation Patient Details Name: Jasmin Lee MRN: [id number] DOB: March 10, 1923 Today's Date: 11/25/2016   History of Present Illness  Pt is a 83 y/o female admitted secondary to sustaining a mechanical fall at home. Pt also found to be hyponatremic. Of note, pt has had multiple falls recently, one of which she sustained a sacral fx (~6 weeks ago). All x-rays negative this admission. PMH including but not limited to HTN, COPD with asthma.  Clinical Impression  Pt presented supine in bed with HOB elevated, awake and willing to participate in therapy session. Prior to admission, pt reported that she ambulates with use of RW and can perform bathing and dressing independently. However, pt also stated that she has a personal care attendant that comes twice a week and assists her with "whatever I need her to do", but would not elaborate further. No family/caregivers present during evaluation to clarify. Pt currently requires mod A for bed mobility. She refused to participate in transfers or ambulate during evaluation. Pt would continue to benefit from skilled physical therapy services at this time while admitted and after d/c to address the below listed limitations in order to improve overall safety and independence with functional mobility.     Follow Up Recommendations SNF    Equipment Recommendations  None recommended by PT    Recommendations for Other Services       Precautions / Restrictions Precautions Precautions: Fall Restrictions Weight Bearing Restrictions: No      Mobility  Bed Mobility Overal bed mobility: Needs Assistance Bed Mobility: Supine to Sit;Sit to Supine     Supine to sit: Mod assist Sit to supine: Mod assist   General bed mobility comments: increased time, vc'ing for sequencing, use of bed rails, mod A to elevate trunk and use of bed pads to position pt's hips at EOB. Mod A with bilateral LEs to return to supine  Transfers                  General transfer comment: pt refusing to attempt at this time  Ambulation/Gait                Stairs            Wheelchair Mobility    Modified Rankin (Stroke Patients Only)       Balance Overall balance assessment: Needs assistance Sitting-balance support: Feet supported Sitting balance-Leahy Scale: Good Sitting balance - Comments: pt able to sit EOB with supervision to brush her teeth                                     Pertinent Vitals/Pain Pain Assessment: Faces Faces Pain Scale: Hurts even more Pain Location: R buttock Pain Descriptors / Indicators: Sore;Grimacing;Guarding Pain Intervention(s): Monitored during session;Repositioned    Home Living Family/patient expects to be discharged to:: Private residence Living Arrangements: Alone Available Help at Discharge: Family;Friend(s);Personal care attendant;Available PRN/intermittently (pt has a PCA that comes 2x/week) Type of Home: House Home Access: Level entry     Home Layout: One level Home Equipment: Walker - 2 wheels;Tub bench;Hand held shower head Additional Comments: pt reported that she has a personal care attendant that comes twice a week and does "whatever I need her to do". When specifically asked about assistance with ADLs, pt initially stating that her PCA assists her but then later stating she can get dressed and bathe herself independently  Prior Function Level of Independence: Independent with assistive device(s)         Comments: pt reported that she ambulates with RW     Hand Dominance        Extremity/Trunk Assessment   Upper Extremity Assessment Upper Extremity Assessment: Generalized weakness    Lower Extremity Assessment Lower Extremity Assessment: Generalized weakness    Cervical / Trunk Assessment Cervical / Trunk Assessment: Kyphotic  Communication   Communication: No difficulties  Cognition Arousal/Alertness: Awake/alert Behavior During  Therapy: WFL for tasks assessed/performed Overall Cognitive Status: No family/caregiver present to determine baseline cognitive functioning Area of Impairment: Memory;Following commands;Problem solving                     Memory: Decreased short-term memory Following Commands: Follows one step commands with increased time     Problem Solving: Difficulty sequencing;Requires verbal cues;Requires tactile cues        General Comments      Exercises     Assessment/Plan    PT Assessment Patient needs continued PT services  PT Problem List Decreased strength;Decreased activity tolerance;Decreased balance;Decreased mobility;Decreased cognition;Decreased coordination;Decreased knowledge of use of DME;Decreased safety awareness;Pain       PT Treatment Interventions DME instruction;Stair training;Gait training;Functional mobility training;Therapeutic activities;Balance training;Neuromuscular re-education;Therapeutic exercise;Patient/family education    PT Goals (Current goals can be found in the Care Plan section)  Acute Rehab PT Goals Patient Stated Goal: return home PT Goal Formulation: With patient Time For Goal Achievement: 12/09/16 Potential to Achieve Goals: Good    Frequency Min 3X/week   Barriers to discharge Decreased caregiver support      Co-evaluation               AM-PAC PT "6 Clicks" Daily Activity  Outcome Measure Difficulty turning over in bed (including adjusting bedclothes, sheets and blankets)?: Total Difficulty moving from lying on back to sitting on the side of the bed? : Total Difficulty sitting down on and standing up from a chair with arms (e.g., wheelchair, bedside commode, etc,.)?: Total Help needed moving to and from a bed to chair (including a wheelchair)?: Total Help needed walking in hospital room?: Total Help needed climbing 3-5 steps with a railing? : Total 6 Click Score: 6    End of Session   Activity Tolerance: Patient  limited by pain Patient left: in bed;with call bell/phone within reach;with nursing/sitter in room;Other (comment) (MD in room) Nurse Communication: Mobility status PT Visit Diagnosis: Other abnormalities of gait and mobility (R26.89);Pain;History of falling (Z91.81) Pain - Right/Left: Right Pain - part of body:  (buttock)    Time: 6789-3810 PT Time Calculation (min) (ACUTE ONLY): 29 min   Charges:   PT Evaluation $PT Eval Moderate Complexity: 1 Procedure PT Treatments $Therapeutic Activity: 8-22 mins   PT G Codes:        [place], PT, DPT [place] 11/25/2016, 11:26 AM

## 2016-11-25 NOTE — Progress Notes (Signed)
CSW met with pt to discuss PT recommendation for SNF.  Patient states she has daily caregiving through "home instead" and that she is not interested in SNF at this time  Patient does request a new walker- CSW passed along request to [facility]  CSW signing off  Jorge Ny, [place] Social Worker [phone]

## 2016-11-25 NOTE — Progress Notes (Signed)
Initial Nutrition Assessment  DOCUMENTATION CODES:   Not applicable  INTERVENTION:   -Boost Breeze po BID, each supplement provides 250 kcal and 9 grams of protein  NUTRITION DIAGNOSIS:   Inadequate oral intake related to poor appetite as evidenced by meal completion < 25%.  GOAL:   Patient will meet greater than or equal to 90% of their needs  MONITOR:   PO intake, Supplement acceptance, Weight trends, Skin, Labs, I & O's  REASON FOR ASSESSMENT:   Consult Assessment of nutrition requirement/status  ASSESSMENT:   Taunja Brickner  is a 83 y.o. female, With a history of hypertension, palpitations, B12 deficiency, COPD with asthma, chronic hyponatremia, and frequent falls in the past several weeks (hospitalized 6 weeks back for sacral fracture and then visited ED 10 days back with fall at home without sustaining any injury) was brought to the ED by her caregiver after she fell in the bathroom and landed in her buttocks.  Pt admitted for acute on chronic hyponatremia.   Per MD notes, s/p fall with lt hip pain, plan to obtain x-ray to rule out fracture.   Wt hx reviewed, reveals wt stability over the past year. Pt shares she may have lost "a few pounds" over the past month. However, unable to obtain current wt at this time.  Spoke with pt, who denies any recent changes in appetite. She typically consumes 3 meals per day (Breakfast: yogurt and fruit OR egg and toast, lunch: frozen dinner, dinner: fruit cup). Pt shares that she often does not feel hungry late in the day. She has caregivers that assist her around the clock.   Pt estimates she consumed all of her eggs and bacon at breakfast. She enjoys the food, but complains "there's just too much of it".    Nutrition-Focused physical exam completed. Findings are moderate to severe fat depletion, moderate to severe muscle depletion, and no edema. Pt reports falling twice in the past month, due to losing her grip on her walker. Exam  difficult to assess and suspect fat and muscle loss is multifactorial.   Discussed importance of good meal intake to promote healing. Case discussed with RN, who feels pt would benefit from a supplement. Pt amenable to Boost Breeze- does not like milky-type supplements.   Medications reviewed and include vitamin D, ferrous sulfate, MVI, vitamin B-12, and vitamin C.   Labs reviewed: Na: 119 (on IV supplementation).   Diet Order:  DIET SOFT Room service appropriate? Yes; Fluid consistency: Thin  Skin:  Reviewed, no issues  Last BM:  PTA  Height:   Ht Readings from Last 1 Encounters:  10/20/16 5\' 2"  (1.575 m)    Weight:   Wt Readings from Last 1 Encounters:  10/20/16 116 lb (52.6 kg)    Ideal Body Weight:  50 kg  BMI:  Estimated body mass index is 21.22 kg/m as calculated from the following:   Height as of 10/20/16: 5\' 2"  (1.575 m).   Weight as of 10/20/16: 116 lb (52.6 kg).  Estimated Nutritional Needs:   Kcal:  1350-1550  Protein:  55-70 grams  Fluid:  1.3-1.5 L  EDUCATION NEEDS:   Education needs addressed  Johndaniel Catlin A. Jimmye Norman, RD, LDN, CDE Pager: [phone] After hours Pager: [phone]

## 2016-11-25 NOTE — Progress Notes (Signed)
PROGRESS NOTE    Jasmin Lee  QJJ:[id number] DOB: 1923-03-05 DOA: 11/24/2016 PCP: [name], MD .    Brief Narrative: 83 year old female presents to the hospital with a fall was found to have profound hyponatremia.    Assessment & Plan:   1-severe profound hyponatremia with hypokalemia :   most likely related to the diuretics ( Lasix) . use in elderly people improving slowly with gentle hydration continue to hydrate with normal saline follow-up with a sodium level closely with a target of 10 mEq correction and every 24 hours , the diuretics in this patient including Lasix and hydrochlorothiazide . 2- severe hip pain : after a fall simple x-rays did not show any fractures I will proceed with a CT scan of the pelvis to exclude any hidden fracture in the meantime nonweightbearing will be approached, continue pain control for now as tolerated , " this last fall she had before this admission led to nondisplaced sacral fracture. 3-history of hypertension : we are going to restart her home medications as tolerated and we are going to be very wise in that regard of pressure control  not to provoke pain further falls with aggressive control. 4-history of COPD stable : at this time continue to monitor .      DVT prophylaxis: (Lovenox)  Code Status:DNR .  Disposition Plan: Home with therapy per her wishes in 2 days .   Consultants:   None   Procedures:   None   Antimicrobials: (specify start and planned stop date. Auto populated tables are space occupying and do not give end dates)  None    Subjective:   Objective: Vitals:   11/24/16 2141 11/25/16 0517 11/25/16 0714 11/25/16 0925  BP: (!) 125/46 (!) 173/65 (!) 168/62   Pulse: 62 71 67 70  Resp: 18 18  18   Temp: 97.9 F (36.6 C) (!) 97.5 F (36.4 C)    TempSrc: Oral Oral    SpO2: 99% 98%  98%    Intake/Output Summary (Last 24 hours) at 11/25/16 1331 Last data filed at 11/25/16 0300  Gross per 24 hour    Intake            437.5 ml  Output                0 ml  Net            437.5 ml   There were no vitals filed for this visit.  Examination:  General exam: NAD , AAO*3. Respiratory system: CTAB . Cardiovascular system: S1 & S2 heard, RRR. No JVD, murmurs, rubs, gallops or clicks. No pedal edema. Gastrointestinal system: Abdomen is nondistended, soft and nontender. No organomegaly or masses felt. Normal bowel sounds heard. Central nervous system: Alert and oriented. No focal neurological deficits. Extremities: Symmetric 5 x 5 power. Skin: No rashes, lesions or ulcers Psychiatry: Judgement and insight appear normal. Mood & affect appropriate.     Data Reviewed: I have personally reviewed following labs and imaging studies  CBC:  Recent Labs Lab 11/24/16 1330  WBC 6.5  NEUTROABS 4.4  HGB 9.6*  HCT 28.2*  MCV 74.0*  PLT 481   Basic Metabolic Panel:  Recent Labs Lab 11/24/16 1330 11/24/16 2113 11/25/16 0608  NA 115* 116* 119*  K 3.3* 3.2* 3.8  CL 80* 84* 88*  CO2 26 24 22   GLUCOSE 97 146* 86  BUN 8 7 8   CREATININE 0.76 0.76 0.76  CALCIUM 8.2* 7.8* 7.9*   GFR:  CrCl cannot be calculated (Unknown ideal weight.). Liver Function Tests: No results for input(s): AST, ALT, ALKPHOS, BILITOT, PROT, ALBUMIN in the last 168 hours. No results for input(s): LIPASE, AMYLASE in the last 168 hours. No results for input(s): AMMONIA in the last 168 hours. Coagulation Profile: No results for input(s): INR, PROTIME in the last 168 hours. Cardiac Enzymes: No results for input(s): CKTOTAL, CKMB, CKMBINDEX, TROPONINI in the last 168 hours. BNP (last 3 results) No results for input(s): PROBNP in the last 8760 hours. HbA1C: No results for input(s): HGBA1C in the last 72 hours. CBG: No results for input(s): GLUCAP in the last 168 hours. Lipid Profile: No results for input(s): CHOL, HDL, LDLCALC, TRIG, CHOLHDL, LDLDIRECT in the last 72 hours. Thyroid Function Tests:  Recent Labs   11/24/16 1852 11/25/16 0848  TSH 14.673*  --   FREET4  --  1.04   Anemia Panel: No results for input(s): VITAMINB12, FOLATE, FERRITIN, TIBC, IRON, RETICCTPCT in the last 72 hours. Urine analysis:    Component Value Date/Time   COLORURINE YELLOW 11/24/2016 1804   APPEARANCEUR CLEAR 11/24/2016 1804   LABSPEC <1.005 (L) 11/24/2016 1804   PHURINE 7.0 11/24/2016 1804   GLUCOSEU NEGATIVE 11/24/2016 1804   HGBUR SMALL (A) 11/24/2016 1804   BILIRUBINUR NEGATIVE 11/24/2016 1804   KETONESUR NEGATIVE 11/24/2016 1804   PROTEINUR 30 (A) 11/24/2016 1804   UROBILINOGEN 0.2 06/11/2013 1951   NITRITE NEGATIVE 11/24/2016 1804   LEUKOCYTESUR TRACE (A) 11/24/2016 1804   Sepsis Labs: @LABRCNTIP (procalcitonin:4,lacticidven:4)  )No results found for this or any previous visit (from the past 240 hour(s)).       Radiology Studies: Dg Lumbar Spine Complete  Result Date: 11/24/2016 CLINICAL DATA:  Back and right leg pain, recent fall EXAM: LUMBAR SPINE - COMPLETE 4+ VIEW COMPARISON:  Lumbar spine films of 11/12/2016 FINDINGS: No acute compression deformity is seen. Diffuse degenerative disc disease throughout the entire lumbar spine is again noted. The bones are osteopenic. Abdominal aortic atherosclerosis again is noted. IMPRESSION: 1. No acute compression deformity. 2. Diffuse degenerative disc disease and osteopenia. 3. Moderate abdominal aortic atherosclerosis. Electronically Signed   By: Ivar Drape M.D.   On: 11/24/2016 11:01   Dg Pelvis 1-2 Views  Result Date: 11/24/2016 CLINICAL DATA:  Back and right leg pain, recent fall EXAM: PELVIS - 1-2 VIEW COMPARISON:  Pelvis film of 11/12/2016 FINDINGS: No acute fracture is seen. There is degenerative change of both hips. The bones are diffusely osteopenic. IMPRESSION: No acute fracture.  Degenerative change of the hips.  Osteopenia. Electronically Signed   By: Ivar Drape M.D.   On: 11/24/2016 11:03   Dg Sacrum/coccyx  Result Date: 11/24/2016 CLINICAL  DATA:  Back and right leg pain, recent fall EXAM: SACRUM AND COCCYX - 2+ VIEW COMPARISON:  Pelvis film of 11/12/2016 FINDINGS: The sacrococcygeal elements are in normal alignment. No compression deformity is seen. The bones are diffusely osteopenic. The sacral foramina appear intact. IMPRESSION: No acute fracture.  Diffuse osteopenia. Electronically Signed   By: Ivar Drape M.D.   On: 11/24/2016 11:02   Ct Pelvis Wo Contrast  Result Date: 11/24/2016 CLINICAL DATA:  Fall, pelvic pain EXAM: CT PELVIS WITHOUT CONTRAST TECHNIQUE: Multidetector CT imaging of the pelvis was performed following the standard protocol without intravenous contrast. COMPARISON:  11/24/2016 FINDINGS: Urinary Tract: Urinary bladder is mildly distended. Distal ureters are decompressed and difficult to follow. Bowel: Sigmoid diverticulosis. No active diverticulitis. Mild gaseous distention of the colon. Vascular/Lymphatic: Diffuse aortic and iliac calcifications.  No aneurysm or adenopathy. Reproductive:  Prior hysterectomy.  No adnexal masses. Other:  No free fluid or free air. Musculoskeletal: No fracture, subluxation or dislocation. IMPRESSION: No acute bony abnormality in the pelvis. Aorta iliac atherosclerosis. Sigmoid diverticulosis. Electronically Signed   By: Rolm Baptise M.D.   On: 11/24/2016 13:32        Scheduled Meds: . acetaminophen  1,000 mg Oral TID  . atorvastatin  10 mg Oral QODAY  . cholecalciferol  1,000 Units Oral Daily  . diltiazem  120 mg Oral Daily  . dimethicone   Topical BID  . enoxaparin (LOVENOX) injection  40 mg Subcutaneous Q24H  . ferrous sulfate  325 mg Oral BID WC  . fluticasone  2 spray Each Nare QHS  . hydrALAZINE  25 mg Oral Q8H  . losartan  150 mg Oral Daily  . mometasone-formoterol  2 puff Inhalation BID  . montelukast  10 mg Oral QHS  . multivitamin with minerals  1 tablet Oral Q breakfast  . pantoprazole  40 mg Oral Daily  . polyvinyl alcohol  1 drop Both Eyes TID  . sodium chloride  flush  3 mL Intravenous Q12H  . cyanocobalamin  1,000 mcg Oral Q breakfast  . vitamin C  250 mg Oral Daily   Continuous Infusions: . 0.9 % NaCl with KCl 20 mEq / L 75 mL/hr at 11/25/16 1004     LOS: 1 day    Time spent: MORE THAN 30 minutes .    Waldron Session, MD [facility] Pager [phone]  If 7PM-7AM, please contact night-coverage [email or web address] Password [email or web address] 11/25/2016, 1:31 PM

## 2016-11-26 DIAGNOSIS — W19XXXA Unspecified fall, initial encounter: Secondary | ICD-10-CM

## 2016-11-26 DIAGNOSIS — E871 Hypo-osmolality and hyponatremia: Principal | ICD-10-CM

## 2016-11-26 DIAGNOSIS — D631 Anemia in chronic kidney disease: Secondary | ICD-10-CM

## 2016-11-26 DIAGNOSIS — I10 Essential (primary) hypertension: Secondary | ICD-10-CM

## 2016-11-26 DIAGNOSIS — J449 Chronic obstructive pulmonary disease, unspecified: Secondary | ICD-10-CM

## 2016-11-26 DIAGNOSIS — N189 Chronic kidney disease, unspecified: Secondary | ICD-10-CM

## 2016-11-26 LAB — COMPREHENSIVE METABOLIC PANEL
ALK PHOS: 100 U/L (ref 38–126)
ALT: 20 U/L (ref 14–54)
AST: 32 U/L (ref 15–41)
Albumin: 2.5 g/dL — ABNORMAL LOW (ref 3.5–5.0)
Anion gap: 5 (ref 5–15)
BUN: 12 mg/dL (ref 6–20)
CALCIUM: 7.9 mg/dL — AB (ref 8.9–10.3)
CO2: 26 mmol/L (ref 22–32)
CREATININE: 0.73 mg/dL (ref 0.44–1.00)
Chloride: 93 mmol/L — ABNORMAL LOW (ref 101–111)
GFR calc non Af Amer: >60 mL/min (ref >60)
Glucose, Bld: 103 mg/dL — ABNORMAL HIGH (ref 65–99)
Potassium: 4 mmol/L (ref 3.5–5.1)
SODIUM: 124 mmol/L — AB (ref 135–145)
Total Bilirubin: 0.4 mg/dL (ref 0.3–1.2)
Total Protein: 4.4 g/dL — ABNORMAL LOW (ref 6.5–8.1)

## 2016-11-26 MED ORDER — ENOXAPARIN SODIUM 30 MG/0.3ML ~~LOC~~ SOLN
30.0000 mg | SUBCUTANEOUS | Status: DC
  Administered 2016-11-26: 30 mg via SUBCUTANEOUS
  Filled 2016-11-26: qty 0.3

## 2016-11-26 NOTE — Care Management Note (Signed)
Case Management Note  Patient Details  Name: Jasmin Lee MRN: [id number] Date of Birth: 08-Oct-1922  Subjective/Objective:                 Presented to the hospital with a fall was found to have profound hyponatremia.      Action/Plan:  Plan is to d/c to home today with home health services.  Expected Discharge Date:     11/26/2016             Expected Discharge Plan:  [place] ( Pt receives 24/7 personal care services through  [facility])  In-House Referral:  Clinical Social Work  Discharge planning Services  CM Consult  Post Acute Care Choice:  Home Health Choice offered to:  Patient  DME Arranged:    DME Agency:     HH Arranged:  PT, OT [place] Agency:  [facility] , referral made with Colman Cater [phone]  Status of Service:  Completed, signed off  If discussed at [place] of Stay Meetings, dates discussed:    Additional Comments:  Sharin Mons, RN 11/26/2016, 11:04 AM

## 2016-11-26 NOTE — Progress Notes (Signed)
Jasmin Lee  HKV:[id number] DOB: 08-Jul-1922 DOA: 11/24/2016 PCP: [name], MD .    Brief Narrative: 83 year old female presents to the hospital with a fall was found to have profound hyponatremia.    Assessment & Plan:   1-severe profound hyponatremia with hypokalemia :  Continues to improve ,  most likely related to the diuretics ( Lasix) . use in elderly people improving slowly with gentle hydration continue to hydrate with normal saline , follow-up with a sodium level closely with a target of 10 mEq correction and every 24 hours , the diuretics in this patient including Lasix and hydrochlorothiazide . 2- severe hip pain : after a fall simple x-rays did not show any fracturescontinue pain control for now as tolerated , " this last fall she had before this admission led to nondisplaced sacral fracture. 3-history of hypertension : we are going to restart her home medications as tolerated and we are going to be very wise in that regard of pressure control  not to provoke pain further falls with aggressive control. 4-history of COPD stable : at this time continue to monitor .   The plan was to discharge today as the sodium is trending up in the right direction the patient has no residual complaints subjectively but the patient asked [place] to defer her discharge tomorrow because of social situation .   DVT prophylaxis: (Lovenox)  Code Status:DNR .  Disposition Plan: Home with therapy per her wishes in 2 days .   Consultants:   None   Procedures:   None   Antimicrobials: (specify start and planned stop date. Auto populated tables are space occupying and do not give end dates)  None    Subjective:  The patient feels better today she has no complaints she is alert awake and oriented she is denying any chest pain or shortness of breath or nausea or vomiting. Objective: Vitals:   11/26/16 0043 11/26/16 0504 11/26/16 0826 11/26/16 1432  BP:  (!)  147/60  (!) 142/75  Pulse:  60 (!) 58 (!) 57  Resp:  18 16 20   Temp: (!) 97.5 F (36.4 C) (!) 97.4 F (36.3 C)  97.6 F (36.4 C)  TempSrc: Oral Oral  Oral  SpO2:  97% 98% 100%  Weight:      Height:        Intake/Output Summary (Last 24 hours) at 11/26/16 1620 Last data filed at 11/26/16 1612  Gross per 24 hour  Intake              885 ml  Output              950 ml  Net              -65 ml   Filed Weights   11/25/16 1738  Weight: 49.9 kg (110 lb)    Examination:  General exam: NAD , AAO*3. Respiratory system: CTAB . Cardiovascular system: S1 & S2 heard, RRR. No JVD, murmurs, rubs, gallops or clicks. No pedal edema. Gastrointestinal system: Abdomen is nondistended, soft and nontender. No organomegaly or masses felt. Normal bowel sounds heard. Central nervous system: Alert and oriented. No focal neurological deficits. Extremities: Symmetric 5 x 5 power. Skin: No rashes, lesions or ulcers Psychiatry: Judgement and insight appear normal. Mood & affect appropriate.     Data Reviewed: I have personally reviewed following labs and imaging studies  CBC:  Recent Labs Lab 11/24/16 1330  WBC 6.5  NEUTROABS  4.4  HGB 9.6*  HCT 28.2*  MCV 74.0*  PLT 962   Basic Metabolic Panel:  Recent Labs Lab 11/24/16 1330 11/24/16 2113 11/25/16 0608 11/25/16 1336 11/26/16 0735  NA 115* 116* 119* 117* 124*  K 3.3* 3.2* 3.8 3.6 4.0  CL 80* 84* 88* 88* 93*  CO2 26 24 22 24 26   GLUCOSE 97 146* 86 104* 103*  BUN 8 7 8 8 12   CREATININE 0.76 0.76 0.76 0.81 0.73  CALCIUM 8.2* 7.8* 7.9* 7.8* 7.9*   GFR: Estimated Creatinine Clearance: 35.3 mL/min (by C-G formula based on SCr of 0.73 mg/dL). Liver Function Tests:  Recent Labs Lab 11/26/16 0735  AST 32  ALT 20  ALKPHOS 100  BILITOT 0.4  PROT 4.4*  ALBUMIN 2.5*   No results for input(s): LIPASE, AMYLASE in the last 168 hours. No results for input(s): AMMONIA in the last 168 hours. Coagulation Profile: No results for  input(s): INR, PROTIME in the last 168 hours. Cardiac Enzymes: No results for input(s): CKTOTAL, CKMB, CKMBINDEX, TROPONINI in the last 168 hours. BNP (last 3 results) No results for input(s): PROBNP in the last 8760 hours. HbA1C: No results for input(s): HGBA1C in the last 72 hours. CBG: No results for input(s): GLUCAP in the last 168 hours. Lipid Profile: No results for input(s): CHOL, HDL, LDLCALC, TRIG, CHOLHDL, LDLDIRECT in the last 72 hours. Thyroid Function Tests:  Recent Labs  11/24/16 1852 11/25/16 0848  TSH 14.673*  --   FREET4  --  1.04   Anemia Panel: No results for input(s): VITAMINB12, FOLATE, FERRITIN, TIBC, IRON, RETICCTPCT in the last 72 hours. Urine analysis:    Component Value Date/Time   COLORURINE YELLOW 11/24/2016 1804   APPEARANCEUR CLEAR 11/24/2016 1804   LABSPEC <1.005 (L) 11/24/2016 1804   PHURINE 7.0 11/24/2016 1804   GLUCOSEU NEGATIVE 11/24/2016 1804   HGBUR SMALL (A) 11/24/2016 1804   BILIRUBINUR NEGATIVE 11/24/2016 1804   KETONESUR NEGATIVE 11/24/2016 1804   PROTEINUR 30 (A) 11/24/2016 1804   UROBILINOGEN 0.2 06/11/2013 1951   NITRITE NEGATIVE 11/24/2016 1804   LEUKOCYTESUR TRACE (A) 11/24/2016 1804   Sepsis Labs: @LABRCNTIP (procalcitonin:4,lacticidven:4)  )No results found for this or any previous visit (from the past 240 hour(s)).       Radiology Studies: No results found.      Scheduled Meds: . acetaminophen  1,000 mg Oral TID  . atorvastatin  10 mg Oral QODAY  . cholecalciferol  1,000 Units Oral Daily  . diltiazem  120 mg Oral Daily  . dimethicone   Topical BID  . enoxaparin (LOVENOX) injection  30 mg Subcutaneous Q24H  . feeding supplement  1 Container Oral BID BM  . ferrous sulfate  325 mg Oral BID WC  . fluticasone  2 spray Each Nare QHS  . hydrALAZINE  25 mg Oral Q8H  . losartan  150 mg Oral Daily  . mometasone-formoterol  2 puff Inhalation BID  . montelukast  10 mg Oral QHS  . multivitamin with minerals  1  tablet Oral Q breakfast  . pantoprazole  40 mg Oral Daily  . polyvinyl alcohol  1 drop Both Eyes TID  . sodium chloride flush  3 mL Intravenous Q12H  . cyanocobalamin  1,000 mcg Oral Q breakfast  . vitamin C  250 mg Oral Daily   Continuous Infusions: . 0.9 % NaCl with KCl 20 mEq / L 1,000 mL (11/26/16 1313)     LOS: 2 days    Time spent: MORE THAN  30 minutes .    Waldron Session, MD [facility] Pager [phone]  If 7PM-7AM, please contact night-coverage [email or web address] Password [email or web address] 11/26/2016, 4:20 PM

## 2016-11-26 NOTE — Discharge Summary (Signed)
Physician Discharge Summary  Jasmin Lee NWG:[id number] DOB: [birth year] DOA: 11/24/2016  PCP: [name], MD  Admit date: 11/24/2016 Discharge date: 11/26/2016  Admitted From: Home. Disposition:  Home.  Recommendations for Outpatient Follow-up:  1. Follow up with PCP in 1 week .  Home Health:yes Equipment/Devices: WALKER .  Discharge Condition:Stable CODE STATUS:DNR Diet recommendation: Heart Healthy  Brief/Interim Summary:    83 y.o. female, With a history of hypertension, palpitations, B12 deficiency, COPD with asthma and frequent falls in the past several weeks Who presents to the hospital with another full was found to have an acute on chronic hyponatremia with a sodium of 114, review of her medication revealed that she is taking Lasix patient was admitted to the hospital started IV fluids normal saline with a gentle hydration she improved significantly CAT scan of the pelvis revealed no fracture , the patient was taken off the Lasix and will be discharged home to continue home health therapy per her request , the patient had no complaints at the time of discharge she is more stable her feet she will use the walker for physical therapy.   Discharge Diagnoses:     1-Severe profound hyponatremia with hypokalemia :  Improved with hydration ,  most likely related to the diuretics ( Lasix) . use in elderly people , the diuretics in this patient including Lasix and hydrochlorothiazide , she is not sure which one she was taken but I instructed her to stop them both. 2- severe hip pain : after a fall simple x-rays did not show any fractures , continue PT OT at home per her request. 3-history of hypertension . 4-history of COPD stable.  Discharge Instructions  Discharge Instructions    Diet - low sodium heart healthy    Complete by:  As directed    Increase activity slowly    Complete by:  As directed      Allergies as of 11/26/2016      Reactions   Almond (diagnostic)  Shortness Of Breath   Asa [aspirin] Shortness Of Breath, Other (See Comments)   On high doses of ASA   Coconut Flavor Shortness Of Breath   Neomycin Shortness Of Breath   Peanut-containing Drug Products Shortness Of Breath   Penicillins Shortness Of Breath, Other (See Comments)   Has patient had a PCN reaction causing immediate rash, facial/tongue/throat swelling, SOB or lightheadedness with hypotension: Yes Has patient had a PCN reaction causing severe rash involving mucus membranes or skin necrosis: No Has patient had a PCN reaction that required hospitalization No Has patient had a PCN reaction occurring within the last 10 years: No If all of the above answers are "NO", then may proceed with Cephalosporin use.   Sulfa Antibiotics Shortness Of Breath   Tizanidine Shortness Of Breath   Lisinopril Cough   Keflex [cephalexin] Rash      Medication List    STOP taking these medications   CALAZIME SKIN PROTECTANT EX   diclofenac sodium 1 % Gel Commonly known as:  VOLTAREN   fluticasone 50 MCG/ACT nasal spray Commonly known as:  FLONASE   Fluticasone-Salmeterol 250-50 MCG/DOSE Aepb Commonly known as:  ADVAIR   furosemide 20 MG tablet Commonly known as:  LASIX   losartan 50 MG tablet Commonly known as:  COZAAR   WOUND CLEANSER EX     TAKE these medications   acetaminophen 500 MG tablet Commonly known as:  TYLENOL Take 1,000 mg by mouth 3 (three) times daily.   albuterol (2.5  MG/3ML) 0.083% nebulizer solution Commonly known as:  PROVENTIL Take 2.5 mg by nebulization every 8 (eight) hours as needed for wheezing or shortness of breath.   albuterol 108 (90 Base) MCG/ACT inhaler Commonly known as:  PROVENTIL HFA;VENTOLIN HFA Inhale 2 puffs into the lungs every 6 (six) hours as needed for wheezing or shortness of breath.   ARTIFICIAL TEARS 1.4 % ophthalmic solution Generic drug:  polyvinyl alcohol Place 1 drop into both eyes 3 (three) times daily.   atorvastatin 10 MG  tablet Commonly known as:  LIPITOR Take 10 mg by mouth every other day.   BIOFREEZE 4 % Gel Generic drug:  Menthol (Topical Analgesic) Apply 1 application topically 3 (three) times daily.   Cholecalciferol 1000 units tablet Take 1,000 Units by mouth daily.   cyanocobalamin 1000 MCG tablet Take 1,000 mcg by mouth daily with breakfast.   DILT-XR 120 MG 24 hr capsule Generic drug:  diltiazem TAKE 1 CAPSULE DAILY   hydrALAZINE 10 MG tablet Commonly known as:  APRESOLINE TAKE 5 TABLETS DAILY (2 TABLETS IN THE MORNING, 1 TABLET IN THE AFTERNOON AND 2 TABLETS IN THE EVENING)   montelukast 10 MG tablet Commonly known as:  SINGULAIR Take 10 mg by mouth at bedtime.   multivitamin with minerals Tabs tablet Take 1 tablet by mouth daily with breakfast.   pantoprazole 40 MG tablet Commonly known as:  PROTONIX Take 1 tablet (40 mg total) by mouth daily.   polyethylene glycol packet Commonly known as:  MIRALAX / GLYCOLAX Take 17 g by mouth daily as needed for mild constipation.   traMADol 50 MG tablet Commonly known as:  ULTRAM Take 1 tablet (50 mg total) by mouth every 6 (six) hours as needed for moderate pain.   vitamin C 250 MG tablet Commonly known as:  ASCORBIC ACID Take 250 mg by mouth daily.      Follow-up Information    [facility] Follow up.   Why:  Home health services arranged, office will call and set up home visits Contact information:   [address], [address]   [place], [place] [address]         Allergies  Allergen Reactions  . Almond (Diagnostic) Shortness Of Breath  . Asa [Aspirin] Shortness Of Breath and Other (See Comments)    On high doses of ASA  . Coconut Flavor Shortness Of Breath  . Neomycin Shortness Of Breath  . Peanut-Containing Drug Products Shortness Of Breath  . Penicillins Shortness Of Breath and Other (See Comments)    Has patient had a PCN reaction causing immediate rash, facial/tongue/throat swelling, SOB or lightheadedness  with hypotension: Yes Has patient had a PCN reaction causing severe rash involving mucus membranes or skin necrosis: No Has patient had a PCN reaction that required hospitalization No Has patient had a PCN reaction occurring within the last 10 years: No If all of the above answers are "NO", then may proceed with Cephalosporin use.   . Sulfa Antibiotics Shortness Of Breath  . Tizanidine Shortness Of Breath  . Lisinopril Cough  . Keflex [Cephalexin] Rash    Consultations: None .  Procedures/Studies: Dg Lumbar Spine Complete  Result Date: 11/24/2016 CLINICAL DATA:  Back and right leg pain, recent fall EXAM: LUMBAR SPINE - COMPLETE 4+ VIEW COMPARISON:  Lumbar spine films of 11/12/2016 FINDINGS: No acute compression deformity is seen. Diffuse degenerative disc disease throughout the entire lumbar spine is again noted. The bones are osteopenic. Abdominal aortic atherosclerosis again is noted.  IMPRESSION: 1. No acute compression deformity. 2. Diffuse degenerative disc disease and osteopenia. 3. Moderate abdominal aortic atherosclerosis. Electronically Signed   By: Ivar Drape M.D.   On: 11/24/2016 11:01   Dg Lumbar Spine Complete  Result Date: 11/12/2016 CLINICAL DATA:  Fall with lumbosacral and sacrococcygeal back pain. EXAM: LUMBAR SPINE - COMPLETE 4+ VIEW COMPARISON:  Radiograph 09/10/2016 FINDINGS: Vertebral body labeled L5 may be in fact lumbarization of S1, difficult to differentiate on the PA view as there may be 6 non-rib-bearing lumbar vertebra. No acute fracture. Stable mild rightward curvature of the lumbar spine. Vertebral body heights are maintained. Diffuse disc space narrowing, endplate spurring, and facet arthropathy throughout the lumbar spine, unchanged from prior exam. Dense aortic atherosclerosis. IMPRESSION: No evidence of acute lumbar spine fracture. Diffuse degenerative disc disease and facet arthropathy. Electronically Signed   By: Jeb Levering M.D.   On: 11/12/2016 02:16    Dg Pelvis 1-2 Views  Result Date: 11/24/2016 CLINICAL DATA:  Back and right leg pain, recent fall EXAM: PELVIS - 1-2 VIEW COMPARISON:  Pelvis film of 11/12/2016 FINDINGS: No acute fracture is seen. There is degenerative change of both hips. The bones are diffusely osteopenic. IMPRESSION: No acute fracture.  Degenerative change of the hips.  Osteopenia. Electronically Signed   By: Ivar Drape M.D.   On: 11/24/2016 11:03   Dg Pelvis 1-2 Views  Result Date: 11/12/2016 CLINICAL DATA:  Fall with lumbosacral and sacrococcygeal back pain. EXAM: PELVIS - 1-2 VIEW COMPARISON:  Pelvic MRI 10/05/2016 FINDINGS: Cortical margins of the bony pelvis are intact. The as for sacral fracture on prior MRI is not seen on AP view the pelvis. Pubic rami are intact. Both femoral heads are seated in the acetabulum. Pubic symphysis and sacroiliac joints are congruent. IMPRESSION: No evidence of acute pelvic fracture. Electronically Signed   By: Jeb Levering M.D.   On: 11/12/2016 02:12   Dg Sacrum/coccyx  Result Date: 11/24/2016 CLINICAL DATA:  Back and right leg pain, recent fall EXAM: SACRUM AND COCCYX - 2+ VIEW COMPARISON:  Pelvis film of 11/12/2016 FINDINGS: The sacrococcygeal elements are in normal alignment. No compression deformity is seen. The bones are diffusely osteopenic. The sacral foramina appear intact. IMPRESSION: No acute fracture.  Diffuse osteopenia. Electronically Signed   By: Ivar Drape M.D.   On: 11/24/2016 11:02   Dg Sacrum/coccyx  Result Date: 11/12/2016 CLINICAL DATA:  Fall with lumbosacral and sacrococcygeal back pain. EXAM: SACRUM AND COCCYX - 2+ VIEW COMPARISON:  Pelvic MRI 10/05/2016 FINDINGS: Minimally displaced fracture in the lower sacrum, as seen on prior MRI. No evidence of new fracture. The sacral ala are maintained. Sacroiliac joints are congruent. IMPRESSION: Minimally displaced sacral fracture, as seen on recent MRI. No evidence of new fracture. Electronically Signed   By: Jeb Levering M.D.   On: 11/12/2016 02:17   Dg Elbow 2 Views Right  Result Date: 11/12/2016 CLINICAL DATA:  83 year old female with fall and right elbow pain. EXAM: RIGHT ELBOW - 2 VIEW COMPARISON:  None. FINDINGS: No obvious fracture identified. Evaluation of the fracture however is limited due to osteopenia. There is no dislocation. There is elevation of the anterior fat pad indicative of joint effusion. An occult fracture is not entirely excluded CT may provide better evaluation if there is high clinical suspicion for fracture. There is mild diffuse subcutaneous edema. IMPRESSION: No definite acute fracture. CT may provide better evaluation if there is high clinical concern for an occult fracture. Electronically Signed  By: Anner Crete M.D.   On: 11/12/2016 02:13   Ct Head Wo Contrast  Result Date: 11/12/2016 CLINICAL DATA:  Fall. EXAM: CT HEAD WITHOUT CONTRAST TECHNIQUE: Contiguous axial images were obtained from the base of the skull through the vertex without intravenous contrast. COMPARISON:  Head CT 06/30/2016 FINDINGS: Brain: No evidence of acute infarction, hemorrhage, hydrocephalus, extra-axial collection or mass lesion/mass effect. Age related atrophy and mild for age chronic small vessel ischemia. Vascular: Atherosclerosis of skullbase vasculature without hyperdense vessel or abnormal calcification. Skull: No acute fracture.  No focal lesion. Sinuses/Orbits: Unchanged opacification of left mastoid air cells. Paranasal sinuses are well-aerated. Bilateral cataract resection. Other: None. IMPRESSION: No acute intracranial abnormality.  No skull fracture. Chronic opacification of left mastoid air cells. Electronically Signed   By: Jeb Levering M.D.   On: 11/12/2016 02:32   Ct Pelvis Wo Contrast  Result Date: 11/24/2016 CLINICAL DATA:  Fall, pelvic pain EXAM: CT PELVIS WITHOUT CONTRAST TECHNIQUE: Multidetector CT imaging of the pelvis was performed following the standard protocol without  intravenous contrast. COMPARISON:  11/24/2016 FINDINGS: Urinary Tract: Urinary bladder is mildly distended. Distal ureters are decompressed and difficult to follow. Bowel: Sigmoid diverticulosis. No active diverticulitis. Mild gaseous distention of the colon. Vascular/Lymphatic: Diffuse aortic and iliac calcifications. No aneurysm or adenopathy. Reproductive:  Prior hysterectomy.  No adnexal masses. Other:  No free fluid or free air. Musculoskeletal: No fracture, subluxation or dislocation. IMPRESSION: No acute bony abnormality in the pelvis. Aorta iliac atherosclerosis. Sigmoid diverticulosis. Electronically Signed   By: Rolm Baptise M.D.   On: 11/24/2016 13:32    (Echo, Carotid, EGD, Colonoscopy, ERCP)    Subjective:   Discharge Exam: Vitals:   11/26/16 0826 11/26/16 1432  BP:  (!) 142/75  Pulse: (!) 58 (!) 57  Resp: 16 20  Temp:  97.6 F (36.4 C)   Vitals:   11/26/16 0043 11/26/16 0504 11/26/16 0826 11/26/16 1432  BP:  (!) 147/60  (!) 142/75  Pulse:  60 (!) 58 (!) 57  Resp:  18 16 20   Temp: (!) 97.5 F (36.4 C) (!) 97.4 F (36.3 C)  97.6 F (36.4 C)  TempSrc: Oral Oral  Oral  SpO2:  97% 98% 100%  Weight:      Height:        General: Pt is alert, awake, not in acute distress Cardiovascular: RRR, S1/S2 +, no rubs, no gallops Respiratory: CTA bilaterally, no wheezing, no rhonchi Abdominal: Soft, NT, ND, bowel sounds + Extremities: no edema, no cyanosis    The results of significant diagnostics from this hospitalization (including imaging, microbiology, ancillary and laboratory) are listed below for reference.     Microbiology: No results found for this or any previous visit (from the past 240 hour(s)).   Labs: BNP (last 3 results) No results for input(s): BNP in the last 8760 hours. Basic Metabolic Panel:  Recent Labs Lab 11/24/16 1330 11/24/16 2113 11/25/16 0608 11/25/16 1336 11/26/16 0735  NA 115* 116* 119* 117* 124*  K 3.3* 3.2* 3.8 3.6 4.0  CL 80* 84*  88* 88* 93*  CO2 26 24 22 24 26   GLUCOSE 97 146* 86 104* 103*  BUN 8 7 8 8 12   CREATININE 0.76 0.76 0.76 0.81 0.73  CALCIUM 8.2* 7.8* 7.9* 7.8* 7.9*   Liver Function Tests:  Recent Labs Lab 11/26/16 0735  AST 32  ALT 20  ALKPHOS 100  BILITOT 0.4  PROT 4.4*  ALBUMIN 2.5*   No results for input(s): LIPASE,  AMYLASE in the last 168 hours. No results for input(s): AMMONIA in the last 168 hours. CBC:  Recent Labs Lab 11/24/16 1330  WBC 6.5  NEUTROABS 4.4  HGB 9.6*  HCT 28.2*  MCV 74.0*  PLT 289   Cardiac Enzymes: No results for input(s): CKTOTAL, CKMB, CKMBINDEX, TROPONINI in the last 168 hours. BNP: Invalid input(s): POCBNP CBG: No results for input(s): GLUCAP in the last 168 hours. D-Dimer No results for input(s): DDIMER in the last 72 hours. Hgb A1c No results for input(s): HGBA1C in the last 72 hours. Lipid Profile No results for input(s): CHOL, HDL, LDLCALC, TRIG, CHOLHDL, LDLDIRECT in the last 72 hours. Thyroid function studies  Recent Labs  11/24/16 1852  TSH 14.673*   Anemia work up No results for input(s): VITAMINB12, FOLATE, FERRITIN, TIBC, IRON, RETICCTPCT in the last 72 hours. Urinalysis    Component Value Date/Time   COLORURINE YELLOW 11/24/2016 1804   APPEARANCEUR CLEAR 11/24/2016 1804   LABSPEC <1.005 (L) 11/24/2016 1804   PHURINE 7.0 11/24/2016 1804   GLUCOSEU NEGATIVE 11/24/2016 1804   HGBUR SMALL (A) 11/24/2016 1804   BILIRUBINUR NEGATIVE 11/24/2016 1804   KETONESUR NEGATIVE 11/24/2016 1804   PROTEINUR 30 (A) 11/24/2016 1804   UROBILINOGEN 0.2 06/11/2013 1951   NITRITE NEGATIVE 11/24/2016 1804   LEUKOCYTESUR TRACE (A) 11/24/2016 1804   Sepsis Labs Invalid input(s): PROCALCITONIN,  WBC,  LACTICIDVEN Microbiology No results found for this or any previous visit (from the past 240 hour(s)).   Time coordinating discharge: Over 30 minutes  SIGNED:   Waldron Session, MD  [facility] 11/26/2016, 4:10 PM Pager [phone]  If  7PM-7AM, please contact night-coverage [email or web address] Password [email or web address]

## 2016-11-26 NOTE — Progress Notes (Signed)
Physical Therapy Treatment Patient Details Name: Jasmin Lee MRN: [id number] DOB: Sep 26, 1922 Today's Date: 11/26/2016    History of Present Illness Pt is a 83 y/o female admitted secondary to sustaining a mechanical fall at home. Pt also found to be hyponatremic. Of note, pt has had multiple falls recently, one of which she sustained a sacral fx (~6 weeks ago). All x-rays negative this admission. PMH including but not limited to HTN, COPD with asthma.    PT Comments    Pt seen for mobility progression. Pt making very slow progress towards achieving her current functional goals. PT continuing to recommend pt d/c to SNF for ST rehab prior to returning home. Pt would continue to benefit from skilled physical therapy services at this time while admitted and after d/c to address the below listed limitations in order to improve overall safety and independence with functional mobility.    Follow Up Recommendations  SNF     Equipment Recommendations  None recommended by PT    Recommendations for Other Services       Precautions / Restrictions Precautions Precautions: Fall Restrictions Weight Bearing Restrictions: No    Mobility  Bed Mobility Overal bed mobility: Needs Assistance Bed Mobility: Supine to Sit;Sit to Supine     Supine to sit: Mod assist Sit to supine: Mod assist   General bed mobility comments: increased time, vc'ing for sequencing, use of bed rails, mod A to elevate trunk and use of bed pads to position pt's hips at EOB. Mod A with bilateral LEs to return to supine  Transfers Overall transfer level: Needs assistance Equipment used: 2 person hand held assist;Rolling walker (2 wheeled) Transfers: Sit to/from [facility] Sit to Stand: Mod assist Stand pivot transfers: Mod assist       General transfer comment: increased time, cueing for sequencing/technique, mod A to rise from bed and for stability. pt performed sit<>stand from bed x3 and from [facility]  x1  Ambulation/Gait                 Stairs            Wheelchair Mobility    Modified Rankin (Stroke Patients Only)       Balance Overall balance assessment: Needs assistance Sitting-balance support: Feet supported Sitting balance-Leahy Scale: Good     Standing balance support: During functional activity;Bilateral upper extremity supported Standing balance-Leahy Scale: Poor Standing balance comment: pt reliant on bilateral UEs on external supports and mod A                            Cognition Arousal/Alertness: Awake/alert Behavior During Therapy: WFL for tasks assessed/performed Overall Cognitive Status: Impaired/Different from baseline Area of Impairment: Memory;Following commands;Problem solving                     Memory: Decreased short-term memory Following Commands: Follows one step commands with increased time     Problem Solving: Difficulty sequencing;Requires verbal cues;Requires tactile cues General Comments: caregiver present this session and reporting that she has some cognitive decline      Exercises      General Comments        Pertinent Vitals/Pain Pain Assessment: Faces Faces Pain Scale: Hurts even more Pain Location: R buttock Pain Descriptors / Indicators: Sore;Grimacing;Guarding Pain Intervention(s): Monitored during session;Repositioned    Home Living  Prior Function            PT Goals (current goals can now be found in the care plan section) Acute Rehab PT Goals PT Goal Formulation: With patient Time For Goal Achievement: 12/09/16 Potential to Achieve Goals: Good Progress towards PT goals: Progressing toward goals    Frequency    Min 3X/week      PT Plan Current plan remains appropriate    Co-evaluation              AM-PAC PT "6 Clicks" Daily Activity  Outcome Measure  Difficulty turning over in bed (including adjusting bedclothes, sheets and  blankets)?: A Lot Difficulty moving from lying on back to sitting on the side of the bed? : Total Difficulty sitting down on and standing up from a chair with arms (e.g., wheelchair, bedside commode, etc,.)?: Total Help needed moving to and from a bed to chair (including a wheelchair)?: A Lot Help needed walking in hospital room?: A Lot Help needed climbing 3-5 steps with a railing? : Total 6 Click Score: 9    End of Session Equipment Utilized During Treatment: Gait belt Activity Tolerance: Patient limited by fatigue Patient left: in bed;with call bell/phone within reach;with nursing/sitter in room;with family/visitor present Nurse Communication: Mobility status PT Visit Diagnosis: Other abnormalities of gait and mobility (R26.89);Pain;History of falling (Z91.81) Pain - Right/Left: Right Pain - part of body:  (buttock)     Time: 6269-4854 PT Time Calculation (min) (ACUTE ONLY): 24 min  Charges:  $Therapeutic Activity: 23-37 mins                    G Codes:       Kimball, PT, DPT [place] 11/26/2016, 12:12 PM

## 2016-11-27 NOTE — Care Management Important Message (Signed)
Important Message  Patient Details  Name: MARILYNN EKSTEIN MRN: [id number] Date of Birth: Oct 06, 1922   Medicare Important Message Given:  Yes    Rushie Brazel 11/27/2016, 12:19 PM

## 2016-11-27 NOTE — Progress Notes (Signed)
Patient was discharged home by MD order; discharged instructions review and give to patient with care notes; IV DIC; patient will be escorted to the car by a volunteer via wheelchair.  

## 2016-11-28 LAB — OSMOLALITY: Osmolality: 241 mOsm/kg — CL (ref 275–295)

## 2016-11-30 ENCOUNTER — Other Ambulatory Visit: Payer: Self-pay | Admitting: Cardiology

## 2016-11-30 DIAGNOSIS — J449 Chronic obstructive pulmonary disease, unspecified: Secondary | ICD-10-CM | POA: Diagnosis not present

## 2016-11-30 DIAGNOSIS — D631 Anemia in chronic kidney disease: Secondary | ICD-10-CM | POA: Diagnosis not present

## 2016-11-30 DIAGNOSIS — S50312D Abrasion of left elbow, subsequent encounter: Secondary | ICD-10-CM | POA: Diagnosis not present

## 2016-11-30 DIAGNOSIS — N189 Chronic kidney disease, unspecified: Secondary | ICD-10-CM | POA: Diagnosis not present

## 2016-11-30 DIAGNOSIS — S50311D Abrasion of right elbow, subsequent encounter: Secondary | ICD-10-CM | POA: Diagnosis not present

## 2016-11-30 DIAGNOSIS — I429 Cardiomyopathy, unspecified: Secondary | ICD-10-CM | POA: Diagnosis not present

## 2016-11-30 DIAGNOSIS — I2510 Atherosclerotic heart disease of native coronary artery without angina pectoris: Secondary | ICD-10-CM | POA: Diagnosis not present

## 2016-11-30 DIAGNOSIS — I129 Hypertensive chronic kidney disease with stage 1 through stage 4 chronic kidney disease, or unspecified chronic kidney disease: Secondary | ICD-10-CM | POA: Diagnosis not present

## 2016-11-30 DIAGNOSIS — S3210XD Unspecified fracture of sacrum, subsequent encounter for fracture with routine healing: Secondary | ICD-10-CM | POA: Diagnosis not present

## 2016-11-30 DIAGNOSIS — E785 Hyperlipidemia, unspecified: Secondary | ICD-10-CM | POA: Diagnosis not present

## 2016-12-02 ENCOUNTER — Telehealth: Payer: Self-pay | Admitting: Cardiology

## 2016-12-02 NOTE — Telephone Encounter (Signed)
Attempt to return call, continues to ring without answer or going to VM 

## 2016-12-02 NOTE — Telephone Encounter (Signed)
New message       Pt was recently in the hosp.  Talk to a nurse to go over medication list.  She states that lots of medications were stopped.  Please call

## 2016-12-03 ENCOUNTER — Ambulatory Visit: Payer: Medicare HMO | Admitting: Podiatry

## 2016-12-03 DIAGNOSIS — S50312D Abrasion of left elbow, subsequent encounter: Secondary | ICD-10-CM | POA: Diagnosis not present

## 2016-12-03 DIAGNOSIS — E785 Hyperlipidemia, unspecified: Secondary | ICD-10-CM | POA: Diagnosis not present

## 2016-12-03 DIAGNOSIS — I429 Cardiomyopathy, unspecified: Secondary | ICD-10-CM | POA: Diagnosis not present

## 2016-12-03 DIAGNOSIS — D631 Anemia in chronic kidney disease: Secondary | ICD-10-CM | POA: Diagnosis not present

## 2016-12-03 DIAGNOSIS — S50311D Abrasion of right elbow, subsequent encounter: Secondary | ICD-10-CM | POA: Diagnosis not present

## 2016-12-03 DIAGNOSIS — N189 Chronic kidney disease, unspecified: Secondary | ICD-10-CM | POA: Diagnosis not present

## 2016-12-03 DIAGNOSIS — I2510 Atherosclerotic heart disease of native coronary artery without angina pectoris: Secondary | ICD-10-CM | POA: Diagnosis not present

## 2016-12-03 DIAGNOSIS — I129 Hypertensive chronic kidney disease with stage 1 through stage 4 chronic kidney disease, or unspecified chronic kidney disease: Secondary | ICD-10-CM | POA: Diagnosis not present

## 2016-12-03 DIAGNOSIS — S3210XD Unspecified fracture of sacrum, subsequent encounter for fracture with routine healing: Secondary | ICD-10-CM | POA: Diagnosis not present

## 2016-12-03 DIAGNOSIS — J449 Chronic obstructive pulmonary disease, unspecified: Secondary | ICD-10-CM | POA: Diagnosis not present

## 2016-12-04 NOTE — Telephone Encounter (Signed)
Spoke with pt, the home health nurse came out and got the medications straight for them. They have no other questions at this time.

## 2016-12-09 DIAGNOSIS — J449 Chronic obstructive pulmonary disease, unspecified: Secondary | ICD-10-CM | POA: Diagnosis not present

## 2016-12-09 DIAGNOSIS — E785 Hyperlipidemia, unspecified: Secondary | ICD-10-CM | POA: Diagnosis not present

## 2016-12-09 DIAGNOSIS — D631 Anemia in chronic kidney disease: Secondary | ICD-10-CM | POA: Diagnosis not present

## 2016-12-09 DIAGNOSIS — I129 Hypertensive chronic kidney disease with stage 1 through stage 4 chronic kidney disease, or unspecified chronic kidney disease: Secondary | ICD-10-CM | POA: Diagnosis not present

## 2016-12-09 DIAGNOSIS — I2510 Atherosclerotic heart disease of native coronary artery without angina pectoris: Secondary | ICD-10-CM | POA: Diagnosis not present

## 2016-12-09 DIAGNOSIS — I429 Cardiomyopathy, unspecified: Secondary | ICD-10-CM | POA: Diagnosis not present

## 2016-12-09 DIAGNOSIS — N189 Chronic kidney disease, unspecified: Secondary | ICD-10-CM | POA: Diagnosis not present

## 2016-12-09 DIAGNOSIS — S50311D Abrasion of right elbow, subsequent encounter: Secondary | ICD-10-CM | POA: Diagnosis not present

## 2016-12-09 DIAGNOSIS — S50312D Abrasion of left elbow, subsequent encounter: Secondary | ICD-10-CM | POA: Diagnosis not present

## 2016-12-09 DIAGNOSIS — S3210XD Unspecified fracture of sacrum, subsequent encounter for fracture with routine healing: Secondary | ICD-10-CM | POA: Diagnosis not present

## 2016-12-10 DIAGNOSIS — E782 Mixed hyperlipidemia: Secondary | ICD-10-CM | POA: Diagnosis not present

## 2016-12-10 DIAGNOSIS — N390 Urinary tract infection, site not specified: Secondary | ICD-10-CM | POA: Diagnosis not present

## 2016-12-10 DIAGNOSIS — I10 Essential (primary) hypertension: Secondary | ICD-10-CM | POA: Diagnosis not present

## 2016-12-10 DIAGNOSIS — Z0000 Encounter for general adult medical examination without abnormal findings: Secondary | ICD-10-CM | POA: Diagnosis not present

## 2016-12-10 DIAGNOSIS — D649 Anemia, unspecified: Secondary | ICD-10-CM | POA: Diagnosis not present

## 2016-12-14 DIAGNOSIS — S50311D Abrasion of right elbow, subsequent encounter: Secondary | ICD-10-CM | POA: Diagnosis not present

## 2016-12-14 DIAGNOSIS — I129 Hypertensive chronic kidney disease with stage 1 through stage 4 chronic kidney disease, or unspecified chronic kidney disease: Secondary | ICD-10-CM | POA: Diagnosis not present

## 2016-12-14 DIAGNOSIS — S3210XD Unspecified fracture of sacrum, subsequent encounter for fracture with routine healing: Secondary | ICD-10-CM | POA: Diagnosis not present

## 2016-12-15 DIAGNOSIS — I429 Cardiomyopathy, unspecified: Secondary | ICD-10-CM | POA: Diagnosis not present

## 2016-12-15 DIAGNOSIS — I129 Hypertensive chronic kidney disease with stage 1 through stage 4 chronic kidney disease, or unspecified chronic kidney disease: Secondary | ICD-10-CM | POA: Diagnosis not present

## 2016-12-15 DIAGNOSIS — E785 Hyperlipidemia, unspecified: Secondary | ICD-10-CM | POA: Diagnosis not present

## 2016-12-15 DIAGNOSIS — S50312D Abrasion of left elbow, subsequent encounter: Secondary | ICD-10-CM | POA: Diagnosis not present

## 2016-12-15 DIAGNOSIS — S50311D Abrasion of right elbow, subsequent encounter: Secondary | ICD-10-CM | POA: Diagnosis not present

## 2016-12-15 DIAGNOSIS — S3210XD Unspecified fracture of sacrum, subsequent encounter for fracture with routine healing: Secondary | ICD-10-CM | POA: Diagnosis not present

## 2016-12-15 DIAGNOSIS — J449 Chronic obstructive pulmonary disease, unspecified: Secondary | ICD-10-CM | POA: Diagnosis not present

## 2016-12-15 DIAGNOSIS — N189 Chronic kidney disease, unspecified: Secondary | ICD-10-CM | POA: Diagnosis not present

## 2016-12-15 DIAGNOSIS — D631 Anemia in chronic kidney disease: Secondary | ICD-10-CM | POA: Diagnosis not present

## 2016-12-15 DIAGNOSIS — I2510 Atherosclerotic heart disease of native coronary artery without angina pectoris: Secondary | ICD-10-CM | POA: Diagnosis not present

## 2016-12-16 DIAGNOSIS — S50312D Abrasion of left elbow, subsequent encounter: Secondary | ICD-10-CM | POA: Diagnosis not present

## 2016-12-16 DIAGNOSIS — I2510 Atherosclerotic heart disease of native coronary artery without angina pectoris: Secondary | ICD-10-CM | POA: Diagnosis not present

## 2016-12-16 DIAGNOSIS — D631 Anemia in chronic kidney disease: Secondary | ICD-10-CM | POA: Diagnosis not present

## 2016-12-16 DIAGNOSIS — I429 Cardiomyopathy, unspecified: Secondary | ICD-10-CM | POA: Diagnosis not present

## 2016-12-16 DIAGNOSIS — N189 Chronic kidney disease, unspecified: Secondary | ICD-10-CM | POA: Diagnosis not present

## 2016-12-16 DIAGNOSIS — J449 Chronic obstructive pulmonary disease, unspecified: Secondary | ICD-10-CM | POA: Diagnosis not present

## 2016-12-16 DIAGNOSIS — E785 Hyperlipidemia, unspecified: Secondary | ICD-10-CM | POA: Diagnosis not present

## 2016-12-16 DIAGNOSIS — S3210XD Unspecified fracture of sacrum, subsequent encounter for fracture with routine healing: Secondary | ICD-10-CM | POA: Diagnosis not present

## 2016-12-16 DIAGNOSIS — I129 Hypertensive chronic kidney disease with stage 1 through stage 4 chronic kidney disease, or unspecified chronic kidney disease: Secondary | ICD-10-CM | POA: Diagnosis not present

## 2016-12-16 DIAGNOSIS — S50311D Abrasion of right elbow, subsequent encounter: Secondary | ICD-10-CM | POA: Diagnosis not present

## 2016-12-17 DIAGNOSIS — E782 Mixed hyperlipidemia: Secondary | ICD-10-CM | POA: Diagnosis not present

## 2016-12-17 DIAGNOSIS — D508 Other iron deficiency anemias: Secondary | ICD-10-CM | POA: Diagnosis not present

## 2016-12-17 DIAGNOSIS — E039 Hypothyroidism, unspecified: Secondary | ICD-10-CM | POA: Diagnosis not present

## 2016-12-17 DIAGNOSIS — M150 Primary generalized (osteo)arthritis: Secondary | ICD-10-CM | POA: Diagnosis not present

## 2016-12-18 DIAGNOSIS — S50312D Abrasion of left elbow, subsequent encounter: Secondary | ICD-10-CM | POA: Diagnosis not present

## 2016-12-18 DIAGNOSIS — N189 Chronic kidney disease, unspecified: Secondary | ICD-10-CM | POA: Diagnosis not present

## 2016-12-18 DIAGNOSIS — S3210XD Unspecified fracture of sacrum, subsequent encounter for fracture with routine healing: Secondary | ICD-10-CM | POA: Diagnosis not present

## 2016-12-18 DIAGNOSIS — I2510 Atherosclerotic heart disease of native coronary artery without angina pectoris: Secondary | ICD-10-CM | POA: Diagnosis not present

## 2016-12-18 DIAGNOSIS — E785 Hyperlipidemia, unspecified: Secondary | ICD-10-CM | POA: Diagnosis not present

## 2016-12-18 DIAGNOSIS — J449 Chronic obstructive pulmonary disease, unspecified: Secondary | ICD-10-CM | POA: Diagnosis not present

## 2016-12-18 DIAGNOSIS — I429 Cardiomyopathy, unspecified: Secondary | ICD-10-CM | POA: Diagnosis not present

## 2016-12-18 DIAGNOSIS — S50311D Abrasion of right elbow, subsequent encounter: Secondary | ICD-10-CM | POA: Diagnosis not present

## 2016-12-18 DIAGNOSIS — I129 Hypertensive chronic kidney disease with stage 1 through stage 4 chronic kidney disease, or unspecified chronic kidney disease: Secondary | ICD-10-CM | POA: Diagnosis not present

## 2016-12-18 DIAGNOSIS — D631 Anemia in chronic kidney disease: Secondary | ICD-10-CM | POA: Diagnosis not present

## 2016-12-21 ENCOUNTER — Ambulatory Visit (INDEPENDENT_AMBULATORY_CARE_PROVIDER_SITE_OTHER): Payer: Medicare HMO | Admitting: Podiatry

## 2016-12-21 DIAGNOSIS — I129 Hypertensive chronic kidney disease with stage 1 through stage 4 chronic kidney disease, or unspecified chronic kidney disease: Secondary | ICD-10-CM | POA: Diagnosis not present

## 2016-12-21 DIAGNOSIS — M79674 Pain in right toe(s): Secondary | ICD-10-CM

## 2016-12-21 DIAGNOSIS — B351 Tinea unguium: Secondary | ICD-10-CM

## 2016-12-21 DIAGNOSIS — S50311D Abrasion of right elbow, subsequent encounter: Secondary | ICD-10-CM | POA: Diagnosis not present

## 2016-12-21 DIAGNOSIS — N189 Chronic kidney disease, unspecified: Secondary | ICD-10-CM | POA: Diagnosis not present

## 2016-12-21 DIAGNOSIS — I2510 Atherosclerotic heart disease of native coronary artery without angina pectoris: Secondary | ICD-10-CM | POA: Diagnosis not present

## 2016-12-21 DIAGNOSIS — S50312D Abrasion of left elbow, subsequent encounter: Secondary | ICD-10-CM | POA: Diagnosis not present

## 2016-12-21 DIAGNOSIS — E785 Hyperlipidemia, unspecified: Secondary | ICD-10-CM | POA: Diagnosis not present

## 2016-12-21 DIAGNOSIS — I429 Cardiomyopathy, unspecified: Secondary | ICD-10-CM | POA: Diagnosis not present

## 2016-12-21 DIAGNOSIS — J449 Chronic obstructive pulmonary disease, unspecified: Secondary | ICD-10-CM | POA: Diagnosis not present

## 2016-12-21 DIAGNOSIS — D631 Anemia in chronic kidney disease: Secondary | ICD-10-CM | POA: Diagnosis not present

## 2016-12-21 DIAGNOSIS — S3210XD Unspecified fracture of sacrum, subsequent encounter for fracture with routine healing: Secondary | ICD-10-CM | POA: Diagnosis not present

## 2016-12-21 NOTE — Progress Notes (Signed)
Subjective: 83 y.o. returns the office today for painful, elongated, thickened toenails which she cannot trim herself. Denies any redness or drainage around the nails. Denies any acute changes since last appointment and no new complaints today. Denies any systemic complaints such as fevers, chills, nausea, vomiting.   Objective: AAO 3, NAD DP/PT pulses palpable, CRT less than 3 seconds Nails hypertrophic, dystrophic, elongated, brittle, discolored 10. There is tenderness overlying the nails 1-5 bilaterally. There is no surrounding erythema or drainage along the nail sites. No open lesions or pre-ulcerative lesions are identified. No other areas of tenderness bilateral lower extremities. No overlying edema, erythema, increased warmth. No pain with calf compression, swelling, warmth, erythema.  Assessment: Patient presents with symptomatic onychomycosis  Plan: -Treatment options including alternatives, risks, complications were discussed -Nails sharply debrided 10 without complication/bleeding. -Discussed daily foot inspection. If there are any changes, to call the office immediately.  -Follow-up in 3 months or sooner if any problems are to arise. In the meantime, encouraged to call the office with any questions, concerns, changes symptoms.  Celesta Gentile, DPM

## 2016-12-22 DIAGNOSIS — S3210XD Unspecified fracture of sacrum, subsequent encounter for fracture with routine healing: Secondary | ICD-10-CM | POA: Diagnosis not present

## 2016-12-22 DIAGNOSIS — J449 Chronic obstructive pulmonary disease, unspecified: Secondary | ICD-10-CM | POA: Diagnosis not present

## 2016-12-22 DIAGNOSIS — I129 Hypertensive chronic kidney disease with stage 1 through stage 4 chronic kidney disease, or unspecified chronic kidney disease: Secondary | ICD-10-CM | POA: Diagnosis not present

## 2016-12-22 DIAGNOSIS — E785 Hyperlipidemia, unspecified: Secondary | ICD-10-CM | POA: Diagnosis not present

## 2016-12-22 DIAGNOSIS — I2510 Atherosclerotic heart disease of native coronary artery without angina pectoris: Secondary | ICD-10-CM | POA: Diagnosis not present

## 2016-12-22 DIAGNOSIS — N189 Chronic kidney disease, unspecified: Secondary | ICD-10-CM | POA: Diagnosis not present

## 2016-12-22 DIAGNOSIS — I429 Cardiomyopathy, unspecified: Secondary | ICD-10-CM | POA: Diagnosis not present

## 2016-12-22 DIAGNOSIS — D631 Anemia in chronic kidney disease: Secondary | ICD-10-CM | POA: Diagnosis not present

## 2016-12-22 DIAGNOSIS — S50312D Abrasion of left elbow, subsequent encounter: Secondary | ICD-10-CM | POA: Diagnosis not present

## 2016-12-22 DIAGNOSIS — S50311D Abrasion of right elbow, subsequent encounter: Secondary | ICD-10-CM | POA: Diagnosis not present

## 2016-12-23 DIAGNOSIS — D631 Anemia in chronic kidney disease: Secondary | ICD-10-CM | POA: Diagnosis not present

## 2016-12-23 DIAGNOSIS — I429 Cardiomyopathy, unspecified: Secondary | ICD-10-CM | POA: Diagnosis not present

## 2016-12-23 DIAGNOSIS — J449 Chronic obstructive pulmonary disease, unspecified: Secondary | ICD-10-CM | POA: Diagnosis not present

## 2016-12-23 DIAGNOSIS — I2510 Atherosclerotic heart disease of native coronary artery without angina pectoris: Secondary | ICD-10-CM | POA: Diagnosis not present

## 2016-12-23 DIAGNOSIS — I129 Hypertensive chronic kidney disease with stage 1 through stage 4 chronic kidney disease, or unspecified chronic kidney disease: Secondary | ICD-10-CM | POA: Diagnosis not present

## 2016-12-23 DIAGNOSIS — E538 Deficiency of other specified B group vitamins: Secondary | ICD-10-CM | POA: Diagnosis not present

## 2016-12-23 DIAGNOSIS — S3210XD Unspecified fracture of sacrum, subsequent encounter for fracture with routine healing: Secondary | ICD-10-CM | POA: Diagnosis not present

## 2016-12-23 DIAGNOSIS — M25511 Pain in right shoulder: Secondary | ICD-10-CM | POA: Diagnosis not present

## 2016-12-23 DIAGNOSIS — N189 Chronic kidney disease, unspecified: Secondary | ICD-10-CM | POA: Diagnosis not present

## 2016-12-23 DIAGNOSIS — M25512 Pain in left shoulder: Secondary | ICD-10-CM | POA: Diagnosis not present

## 2016-12-24 DIAGNOSIS — I129 Hypertensive chronic kidney disease with stage 1 through stage 4 chronic kidney disease, or unspecified chronic kidney disease: Secondary | ICD-10-CM | POA: Diagnosis not present

## 2016-12-24 DIAGNOSIS — M25511 Pain in right shoulder: Secondary | ICD-10-CM | POA: Diagnosis not present

## 2016-12-24 DIAGNOSIS — I429 Cardiomyopathy, unspecified: Secondary | ICD-10-CM | POA: Diagnosis not present

## 2016-12-24 DIAGNOSIS — S3210XD Unspecified fracture of sacrum, subsequent encounter for fracture with routine healing: Secondary | ICD-10-CM | POA: Diagnosis not present

## 2016-12-24 DIAGNOSIS — D631 Anemia in chronic kidney disease: Secondary | ICD-10-CM | POA: Diagnosis not present

## 2016-12-24 DIAGNOSIS — N189 Chronic kidney disease, unspecified: Secondary | ICD-10-CM | POA: Diagnosis not present

## 2016-12-24 DIAGNOSIS — E538 Deficiency of other specified B group vitamins: Secondary | ICD-10-CM | POA: Diagnosis not present

## 2016-12-24 DIAGNOSIS — I2510 Atherosclerotic heart disease of native coronary artery without angina pectoris: Secondary | ICD-10-CM | POA: Diagnosis not present

## 2016-12-24 DIAGNOSIS — M25512 Pain in left shoulder: Secondary | ICD-10-CM | POA: Diagnosis not present

## 2016-12-24 DIAGNOSIS — J449 Chronic obstructive pulmonary disease, unspecified: Secondary | ICD-10-CM | POA: Diagnosis not present

## 2016-12-25 DIAGNOSIS — M25512 Pain in left shoulder: Secondary | ICD-10-CM | POA: Diagnosis not present

## 2016-12-25 DIAGNOSIS — S3210XD Unspecified fracture of sacrum, subsequent encounter for fracture with routine healing: Secondary | ICD-10-CM | POA: Diagnosis not present

## 2016-12-25 DIAGNOSIS — M25511 Pain in right shoulder: Secondary | ICD-10-CM | POA: Diagnosis not present

## 2016-12-25 DIAGNOSIS — I2510 Atherosclerotic heart disease of native coronary artery without angina pectoris: Secondary | ICD-10-CM | POA: Diagnosis not present

## 2016-12-25 DIAGNOSIS — I129 Hypertensive chronic kidney disease with stage 1 through stage 4 chronic kidney disease, or unspecified chronic kidney disease: Secondary | ICD-10-CM | POA: Diagnosis not present

## 2016-12-25 DIAGNOSIS — N189 Chronic kidney disease, unspecified: Secondary | ICD-10-CM | POA: Diagnosis not present

## 2016-12-25 DIAGNOSIS — I429 Cardiomyopathy, unspecified: Secondary | ICD-10-CM | POA: Diagnosis not present

## 2016-12-25 DIAGNOSIS — E538 Deficiency of other specified B group vitamins: Secondary | ICD-10-CM | POA: Diagnosis not present

## 2016-12-25 DIAGNOSIS — D631 Anemia in chronic kidney disease: Secondary | ICD-10-CM | POA: Diagnosis not present

## 2016-12-25 DIAGNOSIS — J449 Chronic obstructive pulmonary disease, unspecified: Secondary | ICD-10-CM | POA: Diagnosis not present

## 2016-12-28 DIAGNOSIS — I2510 Atherosclerotic heart disease of native coronary artery without angina pectoris: Secondary | ICD-10-CM | POA: Diagnosis not present

## 2016-12-28 DIAGNOSIS — D631 Anemia in chronic kidney disease: Secondary | ICD-10-CM | POA: Diagnosis not present

## 2016-12-28 DIAGNOSIS — N189 Chronic kidney disease, unspecified: Secondary | ICD-10-CM | POA: Diagnosis not present

## 2016-12-28 DIAGNOSIS — I429 Cardiomyopathy, unspecified: Secondary | ICD-10-CM | POA: Diagnosis not present

## 2016-12-28 DIAGNOSIS — J449 Chronic obstructive pulmonary disease, unspecified: Secondary | ICD-10-CM | POA: Diagnosis not present

## 2016-12-28 DIAGNOSIS — M25512 Pain in left shoulder: Secondary | ICD-10-CM | POA: Diagnosis not present

## 2016-12-28 DIAGNOSIS — E538 Deficiency of other specified B group vitamins: Secondary | ICD-10-CM | POA: Diagnosis not present

## 2016-12-28 DIAGNOSIS — M25511 Pain in right shoulder: Secondary | ICD-10-CM | POA: Diagnosis not present

## 2016-12-28 DIAGNOSIS — S3210XD Unspecified fracture of sacrum, subsequent encounter for fracture with routine healing: Secondary | ICD-10-CM | POA: Diagnosis not present

## 2016-12-28 DIAGNOSIS — I129 Hypertensive chronic kidney disease with stage 1 through stage 4 chronic kidney disease, or unspecified chronic kidney disease: Secondary | ICD-10-CM | POA: Diagnosis not present

## 2016-12-30 ENCOUNTER — Telehealth: Payer: Self-pay | Admitting: Cardiology

## 2016-12-30 DIAGNOSIS — S3210XD Unspecified fracture of sacrum, subsequent encounter for fracture with routine healing: Secondary | ICD-10-CM | POA: Diagnosis not present

## 2016-12-30 DIAGNOSIS — M25512 Pain in left shoulder: Secondary | ICD-10-CM | POA: Diagnosis not present

## 2016-12-30 DIAGNOSIS — N189 Chronic kidney disease, unspecified: Secondary | ICD-10-CM | POA: Diagnosis not present

## 2016-12-30 DIAGNOSIS — I129 Hypertensive chronic kidney disease with stage 1 through stage 4 chronic kidney disease, or unspecified chronic kidney disease: Secondary | ICD-10-CM | POA: Diagnosis not present

## 2016-12-30 DIAGNOSIS — J449 Chronic obstructive pulmonary disease, unspecified: Secondary | ICD-10-CM | POA: Diagnosis not present

## 2016-12-30 DIAGNOSIS — I2510 Atherosclerotic heart disease of native coronary artery without angina pectoris: Secondary | ICD-10-CM | POA: Diagnosis not present

## 2016-12-30 DIAGNOSIS — E538 Deficiency of other specified B group vitamins: Secondary | ICD-10-CM | POA: Diagnosis not present

## 2016-12-30 DIAGNOSIS — I429 Cardiomyopathy, unspecified: Secondary | ICD-10-CM | POA: Diagnosis not present

## 2016-12-30 DIAGNOSIS — D631 Anemia in chronic kidney disease: Secondary | ICD-10-CM | POA: Diagnosis not present

## 2016-12-30 DIAGNOSIS — M25511 Pain in right shoulder: Secondary | ICD-10-CM | POA: Diagnosis not present

## 2016-12-30 NOTE — Telephone Encounter (Signed)
Returned call to patient no answer.Unable to leave message no voice mail. 

## 2016-12-30 NOTE — Telephone Encounter (Signed)
New message    Pt is calling. She needs help with her medications from when she was in the hospital. Please call.

## 2016-12-31 DIAGNOSIS — D508 Other iron deficiency anemias: Secondary | ICD-10-CM | POA: Diagnosis not present

## 2016-12-31 NOTE — Telephone Encounter (Signed)
If blood pressure is controlled with 5 tables of hydralazine per day, decrease dose to 3 tablets per day may cause a significant increase in BP.  Patient should contact her pharmacy to discuss inaccurate number of pills dispensed on last refill.

## 2016-12-31 NOTE — Telephone Encounter (Signed)
WE CAN CALL IN NEW PRESCRIPTION TO LOCAL PHARMACY  , ALSO NEEDS AN OFFICE APPOINTMENT?  WILL ALSO DEFER TO CVRR?

## 2016-12-31 NOTE — Telephone Encounter (Signed)
Follow up      Pt c/o medication issue:  1. Name of Medication: hydralazine 2. How are you currently taking this medication (dosage and times per day)?  10mg ----5 tab daily  3. Are you having a reaction (difficulty breathing--STAT)?  no 4. What is your medication issue?  Pt states that she only has enough pills to last her until sept 6th if she continues taking 5 a day.  She does not know why her presc was short of pills.  She cannot get another refill until sept 18th.  Pt started taking 3 pills daily about 2 days ago.  Can she continue taking 2 or 3 pills daily until she can get another refill?  Please call friday

## 2017-01-01 DIAGNOSIS — S3210XD Unspecified fracture of sacrum, subsequent encounter for fracture with routine healing: Secondary | ICD-10-CM | POA: Diagnosis not present

## 2017-01-01 DIAGNOSIS — M25511 Pain in right shoulder: Secondary | ICD-10-CM | POA: Diagnosis not present

## 2017-01-01 DIAGNOSIS — E538 Deficiency of other specified B group vitamins: Secondary | ICD-10-CM | POA: Diagnosis not present

## 2017-01-01 DIAGNOSIS — N189 Chronic kidney disease, unspecified: Secondary | ICD-10-CM | POA: Diagnosis not present

## 2017-01-01 DIAGNOSIS — M25512 Pain in left shoulder: Secondary | ICD-10-CM | POA: Diagnosis not present

## 2017-01-01 DIAGNOSIS — I2510 Atherosclerotic heart disease of native coronary artery without angina pectoris: Secondary | ICD-10-CM | POA: Diagnosis not present

## 2017-01-01 DIAGNOSIS — J449 Chronic obstructive pulmonary disease, unspecified: Secondary | ICD-10-CM | POA: Diagnosis not present

## 2017-01-01 DIAGNOSIS — I429 Cardiomyopathy, unspecified: Secondary | ICD-10-CM | POA: Diagnosis not present

## 2017-01-01 DIAGNOSIS — D631 Anemia in chronic kidney disease: Secondary | ICD-10-CM | POA: Diagnosis not present

## 2017-01-01 DIAGNOSIS — I129 Hypertensive chronic kidney disease with stage 1 through stage 4 chronic kidney disease, or unspecified chronic kidney disease: Secondary | ICD-10-CM | POA: Diagnosis not present

## 2017-01-01 MED ORDER — HYDRALAZINE HCL 10 MG PO TABS: ORAL_TABLET | ORAL | 0 refills | Status: DC

## 2017-01-01 NOTE — Telephone Encounter (Signed)
Pt notified of RPH direction, she will increase hydralazine 10mg  back to 5 tablets daily as previously ordered. Physical therapist was there when I called and she states that today BP 170/70 and HR 65, she states that she will go to [facility] to p/u pts rx for hydralazine. PT states that she will return on Tuesday 02-05-17 to take BP, pt will call back with BP reading.

## 2017-01-01 NOTE — Telephone Encounter (Signed)
F/U Call:  Patient wanted to make Jasmin Lee aware that the pharmacy on [address] is too far and that [facility]

## 2017-01-01 NOTE — Telephone Encounter (Signed)
Spoke with pt she states that she is taking hydralazine 10 mg and she states that her pills "all messed up since she was in the hospital" she states that if she takes 1-TID she will have enough to last until 01-28-17 (#84) she [facility] that [facility] could not send a refill until 02-09-17. Pt states that she has not had any sx (dizziness, fatigue, etc) and that her physical therapist was there wed also and stated her BP was normal she thinks that he said it was 138. She states that she will keep a log of her BP when PT is there and takes it. Pt states that at this time she cannot afford a BP cuff and has no transportation to take her to have it done and transportation $$. She states that she does not know what time PT will be coming today, will call back at ~1 pm to get BP for today.  S/w Raquel, RPH she states that pt needs to go back to #5 QD as ordered. Pt will need #35 until she can have a refill. [facility] is out of stock. [facility] has them in stock rx sent. Spoke with pt caregiver she state that she will pick up for pt.

## 2017-01-02 DIAGNOSIS — S3210XD Unspecified fracture of sacrum, subsequent encounter for fracture with routine healing: Secondary | ICD-10-CM | POA: Diagnosis not present

## 2017-01-02 DIAGNOSIS — I129 Hypertensive chronic kidney disease with stage 1 through stage 4 chronic kidney disease, or unspecified chronic kidney disease: Secondary | ICD-10-CM | POA: Diagnosis not present

## 2017-01-02 DIAGNOSIS — M25511 Pain in right shoulder: Secondary | ICD-10-CM | POA: Diagnosis not present

## 2017-01-02 DIAGNOSIS — D631 Anemia in chronic kidney disease: Secondary | ICD-10-CM | POA: Diagnosis not present

## 2017-01-02 DIAGNOSIS — N189 Chronic kidney disease, unspecified: Secondary | ICD-10-CM | POA: Diagnosis not present

## 2017-01-02 DIAGNOSIS — I2510 Atherosclerotic heart disease of native coronary artery without angina pectoris: Secondary | ICD-10-CM | POA: Diagnosis not present

## 2017-01-02 DIAGNOSIS — M25512 Pain in left shoulder: Secondary | ICD-10-CM | POA: Diagnosis not present

## 2017-01-02 DIAGNOSIS — E538 Deficiency of other specified B group vitamins: Secondary | ICD-10-CM | POA: Diagnosis not present

## 2017-01-02 DIAGNOSIS — J449 Chronic obstructive pulmonary disease, unspecified: Secondary | ICD-10-CM | POA: Diagnosis not present

## 2017-01-02 DIAGNOSIS — I429 Cardiomyopathy, unspecified: Secondary | ICD-10-CM | POA: Diagnosis not present

## 2017-01-04 DIAGNOSIS — E538 Deficiency of other specified B group vitamins: Secondary | ICD-10-CM | POA: Diagnosis not present

## 2017-01-04 DIAGNOSIS — I2510 Atherosclerotic heart disease of native coronary artery without angina pectoris: Secondary | ICD-10-CM | POA: Diagnosis not present

## 2017-01-04 DIAGNOSIS — I129 Hypertensive chronic kidney disease with stage 1 through stage 4 chronic kidney disease, or unspecified chronic kidney disease: Secondary | ICD-10-CM | POA: Diagnosis not present

## 2017-01-04 DIAGNOSIS — N189 Chronic kidney disease, unspecified: Secondary | ICD-10-CM | POA: Diagnosis not present

## 2017-01-04 DIAGNOSIS — J449 Chronic obstructive pulmonary disease, unspecified: Secondary | ICD-10-CM | POA: Diagnosis not present

## 2017-01-04 DIAGNOSIS — M25512 Pain in left shoulder: Secondary | ICD-10-CM | POA: Diagnosis not present

## 2017-01-04 DIAGNOSIS — M25511 Pain in right shoulder: Secondary | ICD-10-CM | POA: Diagnosis not present

## 2017-01-04 DIAGNOSIS — S3210XD Unspecified fracture of sacrum, subsequent encounter for fracture with routine healing: Secondary | ICD-10-CM | POA: Diagnosis not present

## 2017-01-04 DIAGNOSIS — I429 Cardiomyopathy, unspecified: Secondary | ICD-10-CM | POA: Diagnosis not present

## 2017-01-04 DIAGNOSIS — D631 Anemia in chronic kidney disease: Secondary | ICD-10-CM | POA: Diagnosis not present

## 2017-01-05 DIAGNOSIS — J449 Chronic obstructive pulmonary disease, unspecified: Secondary | ICD-10-CM | POA: Diagnosis not present

## 2017-01-05 DIAGNOSIS — N189 Chronic kidney disease, unspecified: Secondary | ICD-10-CM | POA: Diagnosis not present

## 2017-01-05 DIAGNOSIS — I2510 Atherosclerotic heart disease of native coronary artery without angina pectoris: Secondary | ICD-10-CM | POA: Diagnosis not present

## 2017-01-05 DIAGNOSIS — M25511 Pain in right shoulder: Secondary | ICD-10-CM | POA: Diagnosis not present

## 2017-01-05 DIAGNOSIS — S3210XD Unspecified fracture of sacrum, subsequent encounter for fracture with routine healing: Secondary | ICD-10-CM | POA: Diagnosis not present

## 2017-01-05 DIAGNOSIS — E538 Deficiency of other specified B group vitamins: Secondary | ICD-10-CM | POA: Diagnosis not present

## 2017-01-05 DIAGNOSIS — I129 Hypertensive chronic kidney disease with stage 1 through stage 4 chronic kidney disease, or unspecified chronic kidney disease: Secondary | ICD-10-CM | POA: Diagnosis not present

## 2017-01-05 DIAGNOSIS — I429 Cardiomyopathy, unspecified: Secondary | ICD-10-CM | POA: Diagnosis not present

## 2017-01-05 DIAGNOSIS — D631 Anemia in chronic kidney disease: Secondary | ICD-10-CM | POA: Diagnosis not present

## 2017-01-05 DIAGNOSIS — M25512 Pain in left shoulder: Secondary | ICD-10-CM | POA: Diagnosis not present

## 2017-01-07 DIAGNOSIS — D508 Other iron deficiency anemias: Secondary | ICD-10-CM | POA: Diagnosis not present

## 2017-01-08 DIAGNOSIS — M25512 Pain in left shoulder: Secondary | ICD-10-CM | POA: Diagnosis not present

## 2017-01-08 DIAGNOSIS — D631 Anemia in chronic kidney disease: Secondary | ICD-10-CM | POA: Diagnosis not present

## 2017-01-08 DIAGNOSIS — I429 Cardiomyopathy, unspecified: Secondary | ICD-10-CM | POA: Diagnosis not present

## 2017-01-08 DIAGNOSIS — S3210XD Unspecified fracture of sacrum, subsequent encounter for fracture with routine healing: Secondary | ICD-10-CM | POA: Diagnosis not present

## 2017-01-08 DIAGNOSIS — M25511 Pain in right shoulder: Secondary | ICD-10-CM | POA: Diagnosis not present

## 2017-01-08 DIAGNOSIS — N189 Chronic kidney disease, unspecified: Secondary | ICD-10-CM | POA: Diagnosis not present

## 2017-01-08 DIAGNOSIS — J449 Chronic obstructive pulmonary disease, unspecified: Secondary | ICD-10-CM | POA: Diagnosis not present

## 2017-01-08 DIAGNOSIS — I2510 Atherosclerotic heart disease of native coronary artery without angina pectoris: Secondary | ICD-10-CM | POA: Diagnosis not present

## 2017-01-08 DIAGNOSIS — I129 Hypertensive chronic kidney disease with stage 1 through stage 4 chronic kidney disease, or unspecified chronic kidney disease: Secondary | ICD-10-CM | POA: Diagnosis not present

## 2017-01-08 DIAGNOSIS — E538 Deficiency of other specified B group vitamins: Secondary | ICD-10-CM | POA: Diagnosis not present

## 2017-01-11 DIAGNOSIS — N189 Chronic kidney disease, unspecified: Secondary | ICD-10-CM | POA: Diagnosis not present

## 2017-01-11 DIAGNOSIS — D631 Anemia in chronic kidney disease: Secondary | ICD-10-CM | POA: Diagnosis not present

## 2017-01-11 DIAGNOSIS — M25512 Pain in left shoulder: Secondary | ICD-10-CM | POA: Diagnosis not present

## 2017-01-11 DIAGNOSIS — I129 Hypertensive chronic kidney disease with stage 1 through stage 4 chronic kidney disease, or unspecified chronic kidney disease: Secondary | ICD-10-CM | POA: Diagnosis not present

## 2017-01-11 DIAGNOSIS — E538 Deficiency of other specified B group vitamins: Secondary | ICD-10-CM | POA: Diagnosis not present

## 2017-01-11 DIAGNOSIS — I429 Cardiomyopathy, unspecified: Secondary | ICD-10-CM | POA: Diagnosis not present

## 2017-01-11 DIAGNOSIS — S3210XD Unspecified fracture of sacrum, subsequent encounter for fracture with routine healing: Secondary | ICD-10-CM | POA: Diagnosis not present

## 2017-01-11 DIAGNOSIS — M25511 Pain in right shoulder: Secondary | ICD-10-CM | POA: Diagnosis not present

## 2017-01-11 DIAGNOSIS — J449 Chronic obstructive pulmonary disease, unspecified: Secondary | ICD-10-CM | POA: Diagnosis not present

## 2017-01-11 DIAGNOSIS — I2510 Atherosclerotic heart disease of native coronary artery without angina pectoris: Secondary | ICD-10-CM | POA: Diagnosis not present

## 2017-01-12 DIAGNOSIS — M25512 Pain in left shoulder: Secondary | ICD-10-CM | POA: Diagnosis not present

## 2017-01-12 DIAGNOSIS — M25511 Pain in right shoulder: Secondary | ICD-10-CM | POA: Diagnosis not present

## 2017-01-12 DIAGNOSIS — N189 Chronic kidney disease, unspecified: Secondary | ICD-10-CM | POA: Diagnosis not present

## 2017-01-12 DIAGNOSIS — D631 Anemia in chronic kidney disease: Secondary | ICD-10-CM | POA: Diagnosis not present

## 2017-01-12 DIAGNOSIS — E538 Deficiency of other specified B group vitamins: Secondary | ICD-10-CM | POA: Diagnosis not present

## 2017-01-12 DIAGNOSIS — I2510 Atherosclerotic heart disease of native coronary artery without angina pectoris: Secondary | ICD-10-CM | POA: Diagnosis not present

## 2017-01-12 DIAGNOSIS — I429 Cardiomyopathy, unspecified: Secondary | ICD-10-CM | POA: Diagnosis not present

## 2017-01-12 DIAGNOSIS — S3210XD Unspecified fracture of sacrum, subsequent encounter for fracture with routine healing: Secondary | ICD-10-CM | POA: Diagnosis not present

## 2017-01-12 DIAGNOSIS — J449 Chronic obstructive pulmonary disease, unspecified: Secondary | ICD-10-CM | POA: Diagnosis not present

## 2017-01-12 DIAGNOSIS — I129 Hypertensive chronic kidney disease with stage 1 through stage 4 chronic kidney disease, or unspecified chronic kidney disease: Secondary | ICD-10-CM | POA: Diagnosis not present

## 2017-01-13 DIAGNOSIS — S3210XD Unspecified fracture of sacrum, subsequent encounter for fracture with routine healing: Secondary | ICD-10-CM | POA: Diagnosis not present

## 2017-01-13 DIAGNOSIS — E538 Deficiency of other specified B group vitamins: Secondary | ICD-10-CM | POA: Diagnosis not present

## 2017-01-13 DIAGNOSIS — I2510 Atherosclerotic heart disease of native coronary artery without angina pectoris: Secondary | ICD-10-CM | POA: Diagnosis not present

## 2017-01-13 DIAGNOSIS — R2689 Other abnormalities of gait and mobility: Secondary | ICD-10-CM | POA: Diagnosis not present

## 2017-01-13 DIAGNOSIS — M25511 Pain in right shoulder: Secondary | ICD-10-CM | POA: Diagnosis not present

## 2017-01-13 DIAGNOSIS — J4520 Mild intermittent asthma, uncomplicated: Secondary | ICD-10-CM | POA: Diagnosis not present

## 2017-01-13 DIAGNOSIS — M25552 Pain in left hip: Secondary | ICD-10-CM | POA: Diagnosis not present

## 2017-01-13 DIAGNOSIS — I429 Cardiomyopathy, unspecified: Secondary | ICD-10-CM | POA: Diagnosis not present

## 2017-01-13 DIAGNOSIS — M6281 Muscle weakness (generalized): Secondary | ICD-10-CM | POA: Diagnosis not present

## 2017-01-13 DIAGNOSIS — M25512 Pain in left shoulder: Secondary | ICD-10-CM | POA: Diagnosis not present

## 2017-01-13 DIAGNOSIS — I129 Hypertensive chronic kidney disease with stage 1 through stage 4 chronic kidney disease, or unspecified chronic kidney disease: Secondary | ICD-10-CM | POA: Diagnosis not present

## 2017-01-13 DIAGNOSIS — J449 Chronic obstructive pulmonary disease, unspecified: Secondary | ICD-10-CM | POA: Diagnosis not present

## 2017-01-13 DIAGNOSIS — D631 Anemia in chronic kidney disease: Secondary | ICD-10-CM | POA: Diagnosis not present

## 2017-01-13 DIAGNOSIS — N189 Chronic kidney disease, unspecified: Secondary | ICD-10-CM | POA: Diagnosis not present

## 2017-01-13 DIAGNOSIS — S3210XA Unspecified fracture of sacrum, initial encounter for closed fracture: Secondary | ICD-10-CM | POA: Diagnosis not present

## 2017-01-15 DIAGNOSIS — J449 Chronic obstructive pulmonary disease, unspecified: Secondary | ICD-10-CM | POA: Diagnosis not present

## 2017-01-15 DIAGNOSIS — N189 Chronic kidney disease, unspecified: Secondary | ICD-10-CM | POA: Diagnosis not present

## 2017-01-15 DIAGNOSIS — I129 Hypertensive chronic kidney disease with stage 1 through stage 4 chronic kidney disease, or unspecified chronic kidney disease: Secondary | ICD-10-CM | POA: Diagnosis not present

## 2017-01-15 DIAGNOSIS — M25511 Pain in right shoulder: Secondary | ICD-10-CM | POA: Diagnosis not present

## 2017-01-15 DIAGNOSIS — S3210XD Unspecified fracture of sacrum, subsequent encounter for fracture with routine healing: Secondary | ICD-10-CM | POA: Diagnosis not present

## 2017-01-15 DIAGNOSIS — I2510 Atherosclerotic heart disease of native coronary artery without angina pectoris: Secondary | ICD-10-CM | POA: Diagnosis not present

## 2017-01-15 DIAGNOSIS — D631 Anemia in chronic kidney disease: Secondary | ICD-10-CM | POA: Diagnosis not present

## 2017-01-15 DIAGNOSIS — I429 Cardiomyopathy, unspecified: Secondary | ICD-10-CM | POA: Diagnosis not present

## 2017-01-15 DIAGNOSIS — E538 Deficiency of other specified B group vitamins: Secondary | ICD-10-CM | POA: Diagnosis not present

## 2017-01-15 DIAGNOSIS — M25512 Pain in left shoulder: Secondary | ICD-10-CM | POA: Diagnosis not present

## 2017-01-19 DIAGNOSIS — M25512 Pain in left shoulder: Secondary | ICD-10-CM | POA: Diagnosis not present

## 2017-01-19 DIAGNOSIS — M25511 Pain in right shoulder: Secondary | ICD-10-CM | POA: Diagnosis not present

## 2017-01-19 DIAGNOSIS — D631 Anemia in chronic kidney disease: Secondary | ICD-10-CM | POA: Diagnosis not present

## 2017-01-19 DIAGNOSIS — I129 Hypertensive chronic kidney disease with stage 1 through stage 4 chronic kidney disease, or unspecified chronic kidney disease: Secondary | ICD-10-CM | POA: Diagnosis not present

## 2017-01-19 DIAGNOSIS — I2510 Atherosclerotic heart disease of native coronary artery without angina pectoris: Secondary | ICD-10-CM | POA: Diagnosis not present

## 2017-01-19 DIAGNOSIS — N189 Chronic kidney disease, unspecified: Secondary | ICD-10-CM | POA: Diagnosis not present

## 2017-01-19 DIAGNOSIS — J449 Chronic obstructive pulmonary disease, unspecified: Secondary | ICD-10-CM | POA: Diagnosis not present

## 2017-01-19 DIAGNOSIS — E538 Deficiency of other specified B group vitamins: Secondary | ICD-10-CM | POA: Diagnosis not present

## 2017-01-19 DIAGNOSIS — S3210XD Unspecified fracture of sacrum, subsequent encounter for fracture with routine healing: Secondary | ICD-10-CM | POA: Diagnosis not present

## 2017-01-19 DIAGNOSIS — I429 Cardiomyopathy, unspecified: Secondary | ICD-10-CM | POA: Diagnosis not present

## 2017-01-22 DIAGNOSIS — I129 Hypertensive chronic kidney disease with stage 1 through stage 4 chronic kidney disease, or unspecified chronic kidney disease: Secondary | ICD-10-CM | POA: Diagnosis not present

## 2017-01-22 DIAGNOSIS — D631 Anemia in chronic kidney disease: Secondary | ICD-10-CM | POA: Diagnosis not present

## 2017-01-22 DIAGNOSIS — J449 Chronic obstructive pulmonary disease, unspecified: Secondary | ICD-10-CM | POA: Diagnosis not present

## 2017-01-22 DIAGNOSIS — I429 Cardiomyopathy, unspecified: Secondary | ICD-10-CM | POA: Diagnosis not present

## 2017-01-22 DIAGNOSIS — N189 Chronic kidney disease, unspecified: Secondary | ICD-10-CM | POA: Diagnosis not present

## 2017-01-22 DIAGNOSIS — E538 Deficiency of other specified B group vitamins: Secondary | ICD-10-CM | POA: Diagnosis not present

## 2017-01-22 DIAGNOSIS — M25512 Pain in left shoulder: Secondary | ICD-10-CM | POA: Diagnosis not present

## 2017-01-22 DIAGNOSIS — I2510 Atherosclerotic heart disease of native coronary artery without angina pectoris: Secondary | ICD-10-CM | POA: Diagnosis not present

## 2017-01-22 DIAGNOSIS — S3210XD Unspecified fracture of sacrum, subsequent encounter for fracture with routine healing: Secondary | ICD-10-CM | POA: Diagnosis not present

## 2017-01-22 DIAGNOSIS — M25511 Pain in right shoulder: Secondary | ICD-10-CM | POA: Diagnosis not present

## 2017-01-25 DIAGNOSIS — I429 Cardiomyopathy, unspecified: Secondary | ICD-10-CM | POA: Diagnosis not present

## 2017-01-25 DIAGNOSIS — D631 Anemia in chronic kidney disease: Secondary | ICD-10-CM | POA: Diagnosis not present

## 2017-01-25 DIAGNOSIS — J449 Chronic obstructive pulmonary disease, unspecified: Secondary | ICD-10-CM | POA: Diagnosis not present

## 2017-01-25 DIAGNOSIS — S3210XD Unspecified fracture of sacrum, subsequent encounter for fracture with routine healing: Secondary | ICD-10-CM | POA: Diagnosis not present

## 2017-01-25 DIAGNOSIS — I2510 Atherosclerotic heart disease of native coronary artery without angina pectoris: Secondary | ICD-10-CM | POA: Diagnosis not present

## 2017-01-25 DIAGNOSIS — E538 Deficiency of other specified B group vitamins: Secondary | ICD-10-CM | POA: Diagnosis not present

## 2017-01-25 DIAGNOSIS — M25511 Pain in right shoulder: Secondary | ICD-10-CM | POA: Diagnosis not present

## 2017-01-25 DIAGNOSIS — N189 Chronic kidney disease, unspecified: Secondary | ICD-10-CM | POA: Diagnosis not present

## 2017-01-25 DIAGNOSIS — I129 Hypertensive chronic kidney disease with stage 1 through stage 4 chronic kidney disease, or unspecified chronic kidney disease: Secondary | ICD-10-CM | POA: Diagnosis not present

## 2017-01-25 DIAGNOSIS — M25512 Pain in left shoulder: Secondary | ICD-10-CM | POA: Diagnosis not present

## 2017-01-29 DIAGNOSIS — I429 Cardiomyopathy, unspecified: Secondary | ICD-10-CM | POA: Diagnosis not present

## 2017-01-29 DIAGNOSIS — D631 Anemia in chronic kidney disease: Secondary | ICD-10-CM | POA: Diagnosis not present

## 2017-01-29 DIAGNOSIS — E538 Deficiency of other specified B group vitamins: Secondary | ICD-10-CM | POA: Diagnosis not present

## 2017-01-29 DIAGNOSIS — M25511 Pain in right shoulder: Secondary | ICD-10-CM | POA: Diagnosis not present

## 2017-01-29 DIAGNOSIS — J449 Chronic obstructive pulmonary disease, unspecified: Secondary | ICD-10-CM | POA: Diagnosis not present

## 2017-01-29 DIAGNOSIS — I129 Hypertensive chronic kidney disease with stage 1 through stage 4 chronic kidney disease, or unspecified chronic kidney disease: Secondary | ICD-10-CM | POA: Diagnosis not present

## 2017-01-29 DIAGNOSIS — M25512 Pain in left shoulder: Secondary | ICD-10-CM | POA: Diagnosis not present

## 2017-01-29 DIAGNOSIS — I2510 Atherosclerotic heart disease of native coronary artery without angina pectoris: Secondary | ICD-10-CM | POA: Diagnosis not present

## 2017-01-29 DIAGNOSIS — S3210XD Unspecified fracture of sacrum, subsequent encounter for fracture with routine healing: Secondary | ICD-10-CM | POA: Diagnosis not present

## 2017-01-29 DIAGNOSIS — N189 Chronic kidney disease, unspecified: Secondary | ICD-10-CM | POA: Diagnosis not present

## 2017-02-01 ENCOUNTER — Other Ambulatory Visit: Payer: Self-pay | Admitting: Cardiology

## 2017-02-02 DIAGNOSIS — J449 Chronic obstructive pulmonary disease, unspecified: Secondary | ICD-10-CM | POA: Diagnosis not present

## 2017-02-02 DIAGNOSIS — E538 Deficiency of other specified B group vitamins: Secondary | ICD-10-CM | POA: Diagnosis not present

## 2017-02-02 DIAGNOSIS — I129 Hypertensive chronic kidney disease with stage 1 through stage 4 chronic kidney disease, or unspecified chronic kidney disease: Secondary | ICD-10-CM | POA: Diagnosis not present

## 2017-02-02 DIAGNOSIS — M25512 Pain in left shoulder: Secondary | ICD-10-CM | POA: Diagnosis not present

## 2017-02-02 DIAGNOSIS — N189 Chronic kidney disease, unspecified: Secondary | ICD-10-CM | POA: Diagnosis not present

## 2017-02-02 DIAGNOSIS — I429 Cardiomyopathy, unspecified: Secondary | ICD-10-CM | POA: Diagnosis not present

## 2017-02-02 DIAGNOSIS — I2510 Atherosclerotic heart disease of native coronary artery without angina pectoris: Secondary | ICD-10-CM | POA: Diagnosis not present

## 2017-02-02 DIAGNOSIS — M25511 Pain in right shoulder: Secondary | ICD-10-CM | POA: Diagnosis not present

## 2017-02-02 DIAGNOSIS — S3210XD Unspecified fracture of sacrum, subsequent encounter for fracture with routine healing: Secondary | ICD-10-CM | POA: Diagnosis not present

## 2017-02-02 DIAGNOSIS — D631 Anemia in chronic kidney disease: Secondary | ICD-10-CM | POA: Diagnosis not present

## 2017-02-03 DIAGNOSIS — M25512 Pain in left shoulder: Secondary | ICD-10-CM | POA: Diagnosis not present

## 2017-02-03 DIAGNOSIS — S3210XD Unspecified fracture of sacrum, subsequent encounter for fracture with routine healing: Secondary | ICD-10-CM | POA: Diagnosis not present

## 2017-02-03 DIAGNOSIS — I129 Hypertensive chronic kidney disease with stage 1 through stage 4 chronic kidney disease, or unspecified chronic kidney disease: Secondary | ICD-10-CM | POA: Diagnosis not present

## 2017-02-03 DIAGNOSIS — N189 Chronic kidney disease, unspecified: Secondary | ICD-10-CM | POA: Diagnosis not present

## 2017-02-03 DIAGNOSIS — I2510 Atherosclerotic heart disease of native coronary artery without angina pectoris: Secondary | ICD-10-CM | POA: Diagnosis not present

## 2017-02-03 DIAGNOSIS — D631 Anemia in chronic kidney disease: Secondary | ICD-10-CM | POA: Diagnosis not present

## 2017-02-03 DIAGNOSIS — J449 Chronic obstructive pulmonary disease, unspecified: Secondary | ICD-10-CM | POA: Diagnosis not present

## 2017-02-03 DIAGNOSIS — E538 Deficiency of other specified B group vitamins: Secondary | ICD-10-CM | POA: Diagnosis not present

## 2017-02-03 DIAGNOSIS — I429 Cardiomyopathy, unspecified: Secondary | ICD-10-CM | POA: Diagnosis not present

## 2017-02-03 DIAGNOSIS — M25511 Pain in right shoulder: Secondary | ICD-10-CM | POA: Diagnosis not present

## 2017-02-04 ENCOUNTER — Ambulatory Visit (INDEPENDENT_AMBULATORY_CARE_PROVIDER_SITE_OTHER): Payer: Medicare HMO | Admitting: Cardiology

## 2017-02-04 VITALS — BP 154/83 | HR 92 | Ht <= 58 in | Wt 110.0 lb

## 2017-02-04 DIAGNOSIS — I10 Essential (primary) hypertension: Secondary | ICD-10-CM | POA: Diagnosis not present

## 2017-02-04 DIAGNOSIS — E785 Hyperlipidemia, unspecified: Secondary | ICD-10-CM | POA: Diagnosis not present

## 2017-02-04 DIAGNOSIS — I4891 Unspecified atrial fibrillation: Secondary | ICD-10-CM

## 2017-02-04 DIAGNOSIS — Z87898 Personal history of other specified conditions: Secondary | ICD-10-CM | POA: Diagnosis not present

## 2017-02-04 DIAGNOSIS — R600 Localized edema: Secondary | ICD-10-CM

## 2017-02-04 MED ORDER — ASPIRIN EC 81 MG PO TBEC: 81.0000 mg | DELAYED_RELEASE_TABLET | Freq: Every day | ORAL | 3 refills | Status: DC

## 2017-02-04 NOTE — Patient Instructions (Addendum)
TAKE AN ASPIRIN 57 MG ONCE DAILY   Your physician recommends that you schedule a follow-up appointment in SEPT 27 ,2018 [facility]

## 2017-02-04 NOTE — Progress Notes (Signed)
PCP: [name], MD  Clinic Note: Chief Complaint  Patient presents with  . Hospitalization Follow-up    HTN, Edema - h/o hyponatremia    HPI: Jasmin Lee is a 83 y.o. female with a PMH below who presents today for delayed annual follow-up of mild hypertensive heart disease and palpitations. She has labile blood pressure.Jasmin Lee was last seen in 2015-10-22 - doing well.  Caregiver for husband (who has now recently died in 10/21/2016).  Recent Hospitalizations: ER hospitalization on July 3 for hyponatremia. She had a fall on June 21.  Studies Personally Reviewed - (if available, images/films reviewed: From Epic Chart or Care Everywhere)  LE Venous Dopplers Jan 2018 - LLE Lesser Saphenous Vein chronic thrombus, No DVT.  Interval History: Jasmin Lee presents today for delayed follow-up, mostly due to her hospitalization.  In the last several months, she has suffered 2 falls - broken sacrum.  She has chronic LE edema that responds well to her Lasix (but since her Hyponatremia admission - dosing was decreased) that she has Rx'd as QOD. On the off days, the swelling gets pretty significant & makes her legs feel tight.  This makes walking difficult & her legs ache.   She notes occasionally feeling short of breath with wheezing & uses her inhaler, but denies any notable CP or SOB with exertion.   No PND, orthopnea She actually says that her palpitations are less noticeable & denies any rapid/irregular Heartbeats. No lightheadedness, dizziness, weakness or syncope/near syncope.. No claudication.  ROS: A comprehensive was performed. Review of Systems  Constitutional: Negative for malaise/fatigue.  HENT: Negative for congestion and nosebleeds.   Respiratory: Positive for cough and wheezing.        Uses her inhaler for asthma symptoms  Cardiovascular:       Per history of present illness  Gastrointestinal: Negative for blood in stool, constipation, heartburn and melena.    Genitourinary: Negative for dysuria, frequency and hematuria.  Musculoskeletal: Positive for back pain, falls (noted in history of present illness) and joint pain (hips and knees).       She does walk using a walker, but has a slow unsteady gait.  Neurological: Positive for weakness (generalized). Negative for dizziness (poor balance) and focal weakness.  Endo/Heme/Allergies: Does not bruise/bleed easily.  Psychiatric/Behavioral: Positive for memory loss (easily confused). Negative for depression. The patient is not nervous/anxious (she still has anxiety issues, but is much less so since the death of her husband. Not having on that responsibility makes her feel better.).   All other systems reviewed and are negative.  I have reviewed and (if needed) personally updated the patient's problem list, medications, allergies, past medical and surgical history, social and family history.   Past Medical History:  Diagnosis Date  . Anemia   . Aortic atherosclerosis ([place]) 2015   CTA Chest: Diffuse atherosclerosis of the aorta, great vessels and coronary arteries  . Arthritis    "in my legs and feet" (05/11/2014)  . Cardiomyopathy ([place])   . Chronic bronchitis ([place]) "2-3 times/yr"  . Complication of anesthesia    "started flinching at start of hysterectomy; they had to give me more anesthetic"  . COPD with asthma ([place])   . Dyslipidemia   . Echocardiogram abnormal 12/05/2010   Moderate concentric left ventricular hypertrophy EF > 89% stage I diastolic dysfunction, left atrium mild to moderate dilatation, moderate mitral annular calcification with trace MR. Trace TR.  Marland Kitchen HTN (hypertension)   .  Hyponatremia 11/2016  . Pneumonia "several times"  . Squamous carcinoma    "left face and thigh"  . Uterine cancer [facility])     Past Surgical History:  Procedure Laterality Date  . APPENDECTOMY  1954  . BREAST BIOPSY Left    "it was nothing"  . CARDIAC EVENT MONITOR  12/05/10-01/02/11   SINUS RHYTHM  .  CATARACT EXTRACTION W/ INTRAOCULAR LENS  IMPLANT, BILATERAL Bilateral   . CHOLECYSTECTOMY  2003  . DILATION AND CURETTAGE OF UTERUS  1966  . SQUAMOUS CELL CARCINOMA EXCISION Left    face and thigh  . TRANSTHORACIC ECHOCARDIOGRAM  December 05, 2010   moderate cocentric LVH WITH STAGE 1 IMPARIED DIASTOLIC DYSFUNCTION , NORMAL EF AND NORMAL LV PRESSURES. LEFT ATRIUM WAS ONLY MILD TO MODERATELY DILATED .MOD MITRAL CALCIFICATION WITH ONLY MILD REGURGITATION.,OTHERWISE RELATIVELY NORMAL  . VAGINAL HYSTERECTOMY  1966   Normal LV size and function Hyperdynamic left ventricle.Moderate concentric LVH with impaired relaxation.  Mild aortic stenosis  Current Meds  Medication Sig  . acetaminophen (TYLENOL) 500 MG tablet Take 1,000 mg by mouth 3 (three) times daily.   Marland Kitchen albuterol (PROVENTIL HFA;VENTOLIN HFA) 108 (90 Base) MCG/ACT inhaler Inhale 2 puffs into the lungs every 6 (six) hours as needed for wheezing or shortness of breath.  Marland Kitchen albuterol (PROVENTIL) (2.5 MG/3ML) 0.083% nebulizer solution Take 2.5 mg by nebulization every 8 (eight) hours as needed for wheezing or shortness of breath.   Marland Kitchen atorvastatin (LIPITOR) 10 MG tablet Take 10 mg by mouth every other day.   Marland Kitchen CALCIUM-MAGNESIUM-ZINC PO Take 1 tablet by mouth daily.  . Cholecalciferol 1000 units tablet Take 1,000 Units by mouth daily.  . cyanocobalamin 1000 MCG tablet Take 1,000 mcg by mouth daily with breakfast.  . DILT-XR 120 MG 24 hr capsule TAKE 1 CAPSULE DAILY  . furosemide (LASIX) 20 MG tablet Take 20 mg by mouth. Monday, Wednesday, Friday, and Saturday.  . hydrALAZINE (APRESOLINE) 10 MG tablet TAKE 5 TABLETS DAILY (2 TABLETS IN THE MORNING, 1 TABLET IN THE AFTERNOON AND 2 TABLETS IN THE EVENING)  . levothyroxine (SYNTHROID, LEVOTHROID) 25 MCG tablet Take 25 mcg by mouth daily before breakfast.  . Menthol, Topical Analgesic, (BIOFREEZE) 4 % GEL Apply 1 application topically 3 (three) times daily.  . montelukast (SINGULAIR) 10 MG tablet Take 10  mg by mouth at bedtime.   . Multiple Vitamin (MULTIVITAMIN WITH MINERALS) TABS tablet Take 1 tablet by mouth daily with breakfast.   . Multiple Vitamins-Minerals (OCUVITE-LUTEIN PO) Take 1 tablet by mouth daily.  . pantoprazole (PROTONIX) 40 MG tablet Take 1 tablet (40 mg total) by mouth daily.  . phenazopyridine (PYRIDIUM) 200 MG tablet Take 200 mg by mouth as needed for pain.  . polyethylene glycol (MIRALAX / GLYCOLAX) packet Take 17 g by mouth daily as needed for mild constipation.  . polyvinyl alcohol (ARTIFICIAL TEARS) 1.4 % ophthalmic solution Place 1 drop into both eyes 3 (three) times daily.  Marland Kitchen tiZANidine (ZANAFLEX) 2 MG tablet Take 2 mg by mouth 2 (two) times daily.  . traMADol (ULTRAM) 50 MG tablet Take 1 tablet (50 mg total) by mouth every 6 (six) hours as needed for moderate pain.  . vitamin C (ASCORBIC ACID) 250 MG tablet Take 250 mg by mouth daily.    Allergies  Allergen Reactions  . Almond (Diagnostic) Shortness Of Breath  . Asa [Aspirin] Shortness Of Breath and Other (See Comments)    On high doses of ASA  . Coconut Flavor Shortness Of  Breath  . Neomycin Shortness Of Breath  . Peanut-Containing Drug Products Shortness Of Breath  . Penicillins Shortness Of Breath and Other (See Comments)    Has patient had a PCN reaction causing immediate rash, facial/tongue/throat swelling, SOB or lightheadedness with hypotension: Yes Has patient had a PCN reaction causing severe rash involving mucus membranes or skin necrosis: No Has patient had a PCN reaction that required hospitalization No Has patient had a PCN reaction occurring within the last 10 years: No If all of the above answers are "NO", then may proceed with Cephalosporin use.   . Sulfa Antibiotics Shortness Of Breath  . Tizanidine Shortness Of Breath  . Lisinopril Cough  . Keflex [Cephalexin] Rash    Social History   Social History  . Marital status: Married    Spouse name: N/A  . Number of children: N/A  . Years  of education: N/A   Social History Main Topics  . Smoking status: Never Smoker  . Smokeless tobacco: Never Used  . Alcohol use No  . Drug use: No  . Sexual activity: No   Other Topics Concern  . None   Social History Narrative  . None    family history includes Asthma in her daughter; Cancer in her father; Heart attack in her maternal grandmother and mother; Heart disease in her son.  Wt Readings from Last 3 Encounters:  02/04/17 110 lb (49.9 kg)  11/25/16 110 lb (49.9 kg)  10/20/16 116 lb (52.6 kg)    PHYSICAL EXAM BP (!) 154/83   Pulse 92   Ht 4\' 10"  (1.473 m)   Wt 110 lb (49.9 kg)   BMI 22.99 kg/m  Physical Exam  Constitutional: She appears well-developed and well-nourished. No distress.  healthy-appearing.  HENT:  Head: Normocephalic and atraumatic.  Mouth/Throat: No oropharyngeal exudate.  Eyes: EOM are normal.  Neck: Normal range of motion. Neck supple. No hepatojugular reflux and no JVD present. Carotid bruit is not present.  Cardiovascular: Normal rate, S1 normal and S2 normal.  An irregularly irregular rhythm present. Frequent extrasystoles are present. PMI is not displaced.  Exam reveals no gallop.   Murmur heard.  Medium-pitched harsh crescendo-decrescendo early systolic murmur is present  at the upper right sternal border    Adult ECG Report -- Checked at the end of the visit.  Rate: 96 ;  Rhythm: atrial fibrillation and ormal axis, intervals and durations. No ischemic changes;   Narrative Interpretation: new finding of atrial fibrillation   Other studies Reviewed: Additional studies/ records that were reviewed today include:  Recent Labs:   Lab Results  Component Value Date   CREATININE 0.73 11/26/2016   BUN 12 11/26/2016   NA 124 (L) 11/26/2016   K 4.0 11/26/2016   CL 93 (L) 11/26/2016   CO2 26 11/26/2016    ASSESSMENT / PLAN: Problem List Items Addressed This Visit    Bilateral lower extremity edema - Primary    Probably related to  venous stasis. Again we discussed support stockings and foot elevation. She was doing very well with Lasix combination of having hyponatremia. She is now back on 20 mg every other day. She has will be attempted take 10 mg daily which sounds reasonable.      Dyslipidemia (Chronic)    Remains on low-dose atorvastatin. Labs followed by PCP.      Essential hypertension (Chronic)    Again her blood pressure is low but high today and she is somewhat stressed out. Will make a change  this time some seeing her back in a few weeks to reassess. We may need to be adjusting medications for rate control of A. fib. Would prefer to have mild permissive hypertension.  Blood pressure was better however with taking hydralazine 2 tabs in the morning 1 tab in midday and 2 in the evening. She is currently taking 1, 1,1. We will refill her regular prescription so she can restart original dosing.      Relevant Medications   furosemide (LASIX) 20 MG tablet   aspirin EC 81 MG tablet   History of palpitations (Chronic)    She's had frequent episodes of palpitations in the past, but we have been unable to capture any arrhythmia. Now with found atrial fibrillation. I suspect she is probably having PACs.  Currently not symptomatic.Marland Kitchen Continue diltiazem      New onset a-fib [facility])    This is a brand-new diagnosis for her, and she is totally asymptomatic.  It was diagnosed at the end of her clinic visit. She was ready to leave, and did not want to wait to discuss new information about EKG. We will see her back in 1-2 weeks to discuss further management of A. fib and what it entails.  Recommend that she takes at least baby aspirin daily Would not pursue significant ischemic evaluation, and would likely opt for rate control of her rhythm control.  As it turns out she is already on diltiazem      Relevant Medications   furosemide (LASIX) 20 MG tablet   aspirin EC 81 MG tablet   Other Relevant Orders   EKG 12-Lead  (Completed)     This patients CHA2DS2-VASc Score and unadjusted Ischemic Stroke Rate (% per year) is equal to 7.2 % stroke rate/year from a score of 5  Above score calculated as 1 point each if present [CHF, HTN, DM, Vascular=MI/PAD/Aortic Plaque, Age if 7-74, or Female]; 2 points each if present [Age > 75, or Stroke/TIA/TE]   Current medicines are reviewed at length with the patient today. (+/- concerns) n/a The following changes have been made: start ASA 81 mg  Patient Instructions  TAKE AN ASPIRIN 81 MG ONCE DAILY   Your physician recommends that you schedule a follow-up appointment in SEPT 27 ,2018 THURSDAY    Studies Ordered:   Orders Placed This Encounter  Procedures  . EKG 12-Lead      Glenetta Hew, M.D., M.S. Interventional Cardiologist   Pager # [phone] Phone # [phone] [address] [place], [place] [address]

## 2017-02-05 DIAGNOSIS — M25511 Pain in right shoulder: Secondary | ICD-10-CM | POA: Diagnosis not present

## 2017-02-05 DIAGNOSIS — M25512 Pain in left shoulder: Secondary | ICD-10-CM | POA: Diagnosis not present

## 2017-02-05 DIAGNOSIS — I129 Hypertensive chronic kidney disease with stage 1 through stage 4 chronic kidney disease, or unspecified chronic kidney disease: Secondary | ICD-10-CM | POA: Diagnosis not present

## 2017-02-05 DIAGNOSIS — I482 Chronic atrial fibrillation, unspecified: Secondary | ICD-10-CM | POA: Insufficient documentation

## 2017-02-05 NOTE — Assessment & Plan Note (Signed)
She's had frequent episodes of palpitations in the past, but we have been unable to capture any arrhythmia. Now with found atrial fibrillation. I suspect she is probably having PACs.  Currently not symptomatic.Marland Kitchen Continue diltiazem

## 2017-02-05 NOTE — Assessment & Plan Note (Addendum)
This is a brand-new diagnosis for her, and she is totally asymptomatic.  It was diagnosed at the end of her clinic visit. She was ready to leave, and did not want to wait to discuss new information about EKG. We will see her back in 1-2 weeks to discuss further management of A. fib and what it entails.  Recommend that she takes at least baby aspirin daily Would not pursue significant ischemic evaluation, and would likely opt for rate control of her rhythm control.  As it turns out she is already on diltiazem

## 2017-02-05 NOTE — Assessment & Plan Note (Addendum)
Again her blood pressure is low but high today and she is somewhat stressed out. Will make a change this time some seeing her back in a few weeks to reassess. We may need to be adjusting medications for rate control of A. fib. Would prefer to have mild permissive hypertension.  Blood pressure was better however with taking hydralazine 2 tabs in the morning 1 tab in midday and 2 in the evening. She is currently taking 1, 1,1. We will refill her regular prescription so she can restart original dosing.

## 2017-02-05 NOTE — Assessment & Plan Note (Signed)
Probably related to venous stasis. Again we discussed support stockings and foot elevation. She was doing very well with Lasix combination of having hyponatremia. She is now back on 20 mg every other day. She has will be attempted take 10 mg daily which sounds reasonable.

## 2017-02-06 ENCOUNTER — Encounter: Payer: Self-pay | Admitting: Cardiology

## 2017-02-06 NOTE — Assessment & Plan Note (Signed)
Remains on low-dose atorvastatin. Labs followed by PCP.

## 2017-02-11 ENCOUNTER — Telehealth: Payer: Self-pay | Admitting: Cardiology

## 2017-02-11 NOTE — Telephone Encounter (Signed)
No answer/no answering machine

## 2017-02-11 NOTE — Telephone Encounter (Signed)
Follow up    Pt returned call to [facility]. She asked if she can be called after 4.

## 2017-02-11 NOTE — Telephone Encounter (Signed)
SPOKE WITH PATIENT SHE STATES SHE WILL BE ABLE TO COME TO APPOINTMENT ON 02/18/17  AT 1:20 PM

## 2017-02-11 NOTE — Telephone Encounter (Signed)
New message    Pt is calling asking for a call back from Dale. She said she is returning the call.

## 2017-02-12 DIAGNOSIS — I129 Hypertensive chronic kidney disease with stage 1 through stage 4 chronic kidney disease, or unspecified chronic kidney disease: Secondary | ICD-10-CM | POA: Diagnosis not present

## 2017-02-12 DIAGNOSIS — M25511 Pain in right shoulder: Secondary | ICD-10-CM | POA: Diagnosis not present

## 2017-02-12 DIAGNOSIS — M25512 Pain in left shoulder: Secondary | ICD-10-CM | POA: Diagnosis not present

## 2017-02-12 DIAGNOSIS — S3210XD Unspecified fracture of sacrum, subsequent encounter for fracture with routine healing: Secondary | ICD-10-CM | POA: Diagnosis not present

## 2017-02-18 ENCOUNTER — Ambulatory Visit: Payer: Medicare HMO | Admitting: Cardiology

## 2017-02-18 ENCOUNTER — Encounter: Payer: Self-pay | Admitting: Cardiology

## 2017-02-18 ENCOUNTER — Ambulatory Visit (INDEPENDENT_AMBULATORY_CARE_PROVIDER_SITE_OTHER): Payer: Medicare HMO | Admitting: Cardiology

## 2017-02-18 VITALS — BP 164/78 | HR 92 | Ht <= 58 in | Wt 106.2 lb

## 2017-02-18 DIAGNOSIS — R600 Localized edema: Secondary | ICD-10-CM | POA: Diagnosis not present

## 2017-02-18 DIAGNOSIS — E440 Moderate protein-calorie malnutrition: Secondary | ICD-10-CM

## 2017-02-18 DIAGNOSIS — E871 Hypo-osmolality and hyponatremia: Secondary | ICD-10-CM | POA: Diagnosis not present

## 2017-02-18 DIAGNOSIS — I10 Essential (primary) hypertension: Secondary | ICD-10-CM | POA: Diagnosis not present

## 2017-02-18 DIAGNOSIS — I4891 Unspecified atrial fibrillation: Secondary | ICD-10-CM | POA: Diagnosis not present

## 2017-02-18 MED ORDER — APIXABAN 2.5 MG PO TABS: 2.5000 mg | ORAL_TABLET | Freq: Two times a day (BID) | ORAL | 0 refills | Status: DC

## 2017-02-18 MED ORDER — DILTIAZEM HCL ER COATED BEADS 180 MG PO CP24: 180.0000 mg | ORAL_CAPSULE | Freq: Every day | ORAL | 3 refills | Status: AC

## 2017-02-18 MED ORDER — APIXABAN 2.5 MG PO TABS: 2.5000 mg | ORAL_TABLET | Freq: Two times a day (BID) | ORAL | 2 refills | Status: AC

## 2017-02-18 NOTE — Assessment & Plan Note (Signed)
Significantly reduced allergen level of 2.5 back in July. This is quite likely is a major component leading to her edema. We talked about nutrition supplementation with recommendations noted below.

## 2017-02-18 NOTE — Assessment & Plan Note (Signed)
Her blood pressure is higher today than usual and she is now taking her high dose of hydralazine. I am increasing her diltiazem dose which may potentially help this. She has had issues with low blood pressures as well so I am leery of being too aggressive. The lack of CHF symptoms other than edema would suggest that her lower extremity edema is probably more venous stasis and protein malnutrition related more so than heart failure. Therefore think allowing for mild permissive hypertension may be prudent.

## 2017-02-18 NOTE — Assessment & Plan Note (Signed)
Hyponatremia probably related to low albumin levels as well. It also makes it very difficult for [place] to use higher dose of diuretic. For now, need to consider a different type of diuretic for instance spironolactone. Labs being followed by PCP next week.

## 2017-02-18 NOTE — Assessment & Plan Note (Addendum)
I spent half of the visit today (roughly 20 minutes) discussing the pathophysiology of atrial fibrillation, symptoms associated with it and even options as well as the risk of stroke with coagulation versus bleeding risk.  Plan: Based on her lack of true symptoms from A. fib, I think the best course of action is simply rate control. Since her heart rate is 92 bpm I will increase her diltiazem to 180 mg daily. -- We discussed anticoagulation with her CHA2DS2-Vasc score on noted below - recommendation would be full affect regulation with ELIQUIS are Xarelto. I've chosen to use the low-dose 2.5 mg of ELIQUIS and for her to stop aspirin when she starts this.    This patients CHA2DS2-VASc Score and unadjusted Ischemic Stroke Rate (% per year) is equal to 7.2 % stroke rate/year from a score of 5 Above score calculated as 1 point each if present [CHF, HTN, DM, Vascular=MI/PAD/Aortic Plaque, Age if 65-74, or Female]; 2 points each if present [Age > 75, or Stroke/TIA/TE]

## 2017-02-18 NOTE — Patient Instructions (Addendum)
RECOMMENDATION-- FOER LE SWELLING Please purchase--  Compression knee -hi- Try the zippered- kind  - can find at [facility] or possibly at [employer]  If unable to locate can you use ace wraps -legs.  use Gauze over the area that is weeping.   BECAUSE YOU DO NOT HAVE ENOUGH PROTEIN IN YOUR BODY TRY USE THIS TYPE OF SUPPLEMENTS      1) [employer] STORE-- CAN PURCHASE DAIRY FREE PROTEIN POWDER TO USE       2) CAN USE LACTOSE FREE MILKS- SOY MILK, ALMOND MILK FOR THE              MIXTURE      3) CARNATION BREAKFAST  OF MILK-use soy milk, almond milk   HAVE LAB RESULT SENT TO OFFICE   MEDICATIONS INCREASE DILTIAZEM 180 mg ONE TABLET DAILY    START  TAKING ELIQUIS 2.5 MG TWICE A DAY, THEN STOP TAKING ASPIRIN DAILY      Your physician wants you to follow-up in JAN 2018 [place]. You will receive a reminder letter in the mail two months in advance. If you don't receive a letter, please call our office to schedule the follow-up appointment.   If you need a refill on your cardiac medications before your next appointment, please call your pharmacy.

## 2017-02-18 NOTE — Progress Notes (Signed)
PCP: [name], MD  Clinic Note: Chief Complaint  Patient presents with  . Follow-up  . Leg Swelling  . Atrial Fibrillation    HPI: Jasmin Lee is a 83 y.o. female with a PMH below who presents today for Short f/u to discuss Atrial Fibrillation identified during her recent annual follow-up of mild hypertensive heart disease and palpitations. She has labile blood pressure.. 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  Jasmin Lee was last seen on 02/04/2017 for hospital follow-up  /delayed annual follow-up secondary to multiple hospitalizations. She had 2 falls of late. She was noted to have motion edema that responded well to Lasix but had to have the dose reduced secondary to hyponatremia.  She was noted on her EKG at the end of the visit did have atrial fibrillation, but barely noted palpitations. Because of the length of that visit I decided to have her come back to discuss atrial fibrillation.   Recent Hospitalizations: ER hospitalization on July 3 for hyponatremia. She had a fall on June 21.  Studies Personally Reviewed - (if available, images/films reviewed: From Epic Chart or Care Everywhere)  LE Venous Dopplers Jan 2018 - LLE Lesser Saphenous Vein chronic thrombus, No DVT.  Interval History: Jasmin Lee presents today to discuss her atrial fibrillation, but also complaining of significant lower extremity edema with oozing edema in the left leg.  Jasmin Lee really has been doing fairly well otherwise. She tries to get around as much as possible. She is trying to elevate her legs with the assistance of her caregiver. One thing is that she has not really been eating as much of late, but is trying to figure out a way to supplement her meals. She does note that recently she has been feeling her heart out of rhythm a little bit, but denies any exertional dyspnea. She sleeps on 2 pillows because of her "asthma" but denies any PND along  with her pitting/weeping edema. She's not had any further falls, and notes that she is usually relatively stable as long as she uses her walker. We had tried to have her take 10 mg of Lasix every day, but she was not able to cut the pill in half so she is still taking 20 mg every other day.   She continues to deny any chest tightness or pressure rest or exertion. Her dyspnea is usually alleviated with eating her inhaler. He denies any lightheadedness, dizziness or wooziness, syncope/near syncope or TIA/amaurosis fugax. No claudication.   ROS: A comprehensive was performed. Review of Systems  Constitutional: Positive for weight loss. Negative for malaise/fatigue.  HENT: Negative for congestion and nosebleeds.   Respiratory: Positive for wheezing (Associated with asthma). Negative for cough.        Uses her inhaler for asthma symptoms  Cardiovascular: Positive for leg swelling.       Per history of present illness  Gastrointestinal: Negative for blood in stool, constipation, heartburn and melena.  Genitourinary: Negative for dysuria, frequency and hematuria.  Musculoskeletal: Positive for back pain and joint pain (hips and knees). Negative for falls (No further falls).       She does walk using a walker, but has a slow unsteady gait.  Neurological: Positive for weakness (generalized). Negative for dizziness (poor balance) and focal weakness.  Endo/Heme/Allergies: Does not bruise/bleed easily.  Psychiatric/Behavioral: Positive for memory loss (easily confused). Negative for depression. The patient is not nervous/anxious (Much less without the  responsibility of caring for her husband.).   All other systems reviewed and are negative.  I have reviewed and (if needed) personally updated the patient's problem list, medications, allergies, past medical and surgical history, social and family history.   Past Medical History:  Diagnosis Date  . Anemia   . Aortic atherosclerosis ([place]) 2015   CTA  Chest: Diffuse atherosclerosis of the aorta, great vessels and coronary arteries  . Arthritis    "in my legs and feet" (05/11/2014)  . Cardiomyopathy ([place])   . Chronic bronchitis ([place]) "2-3 times/yr"  . Complication of anesthesia    "started flinching at start of hysterectomy; they had to give me more anesthetic"  . COPD with asthma ([place])   . Dyslipidemia   . Echocardiogram abnormal 12/05/2010   Moderate concentric left ventricular hypertrophy EF > 01% stage I diastolic dysfunction, left atrium mild to moderate dilatation, moderate mitral annular calcification with trace MR. Trace TR.  Marland Kitchen HTN (hypertension)   . Hyponatremia 11/2016  . Pneumonia "several times"  . Squamous carcinoma    "left face and thigh"  . Uterine cancer [facility])     Past Surgical History:  Procedure Laterality Date  . APPENDECTOMY  1954  . BREAST BIOPSY Left    "it was nothing"  . CARDIAC EVENT MONITOR  12/05/10-01/02/11   SINUS RHYTHM  . CATARACT EXTRACTION W/ INTRAOCULAR LENS  IMPLANT, BILATERAL Bilateral   . CHOLECYSTECTOMY  2003  . DILATION AND CURETTAGE OF UTERUS  1966  . SQUAMOUS CELL CARCINOMA EXCISION Left    face and thigh  . TRANSTHORACIC ECHOCARDIOGRAM  04/2014    Normal LV size and function Hyperdynamic left ventricle.Moderate concentric LVH with impaired relaxation.  Mild aortic stenosis  . VAGINAL HYSTERECTOMY  1966    Current Meds  Medication Sig  . acetaminophen (TYLENOL) 500 MG tablet Take 1,000 mg by mouth 3 (three) times daily.   Marland Kitchen albuterol (PROVENTIL HFA;VENTOLIN HFA) 108 (90 Base) MCG/ACT inhaler Inhale 2 puffs into the lungs every 6 (six) hours as needed for wheezing or shortness of breath.  Marland Kitchen albuterol (PROVENTIL) (2.5 MG/3ML) 0.083% nebulizer solution Take 2.5 mg by nebulization every 8 (eight) hours as needed for wheezing or shortness of breath.   Marland Kitchen atorvastatin (LIPITOR) 10 MG tablet Take 10 mg by mouth every other day.   Marland Kitchen CALCIUM-MAGNESIUM-ZINC PO Take 1 tablet by mouth daily.  .  Cholecalciferol 1000 units tablet Take 1,000 Units by mouth daily.  . cyanocobalamin 1000 MCG tablet Take 1,000 mcg by mouth daily with breakfast.  . furosemide (LASIX) 20 MG tablet Take 20 mg by mouth. Monday, Wednesday, Friday, and Saturday.  . hydrALAZINE (APRESOLINE) 10 MG tablet TAKE 5 TABLETS DAILY (2 TABLETS IN THE MORNING, 1 TABLET IN THE AFTERNOON AND 2 TABLETS IN THE EVENING)  . levothyroxine (SYNTHROID, LEVOTHROID) 25 MCG tablet Take 25 mcg by mouth daily before breakfast.  . Menthol, Topical Analgesic, (BIOFREEZE) 4 % GEL Apply 1 application topically 3 (three) times daily.  . montelukast (SINGULAIR) 10 MG tablet Take 10 mg by mouth at bedtime.   . Multiple Vitamin (MULTIVITAMIN WITH MINERALS) TABS tablet Take 1 tablet by mouth daily with breakfast.   . Multiple Vitamins-Minerals (OCUVITE-LUTEIN PO) Take 1 tablet by mouth daily.  . pantoprazole (PROTONIX) 40 MG tablet Take 1 tablet (40 mg total) by mouth daily.  . phenazopyridine (PYRIDIUM) 200 MG tablet Take 200 mg by mouth as needed for pain.  . polyethylene glycol (MIRALAX / GLYCOLAX) packet Take 17 g  by mouth daily as needed for mild constipation.  . polyvinyl alcohol (ARTIFICIAL TEARS) 1.4 % ophthalmic solution Place 1 drop into both eyes 3 (three) times daily.  Marland Kitchen tiZANidine (ZANAFLEX) 2 MG tablet Take 2 mg by mouth 2 (two) times daily.  . traMADol (ULTRAM) 50 MG tablet Take 1 tablet (50 mg total) by mouth every 6 (six) hours as needed for moderate pain.  . vitamin C (ASCORBIC ACID) 250 MG tablet Take 250 mg by mouth daily.  . [DISCONTINUED] aspirin EC 81 MG tablet Take 1 tablet (81 mg total) by mouth daily.  . [DISCONTINUED] DILT-XR 120 MG 24 hr capsule TAKE 1 CAPSULE DAILY    Allergies  Allergen Reactions  . Almond (Diagnostic) Shortness Of Breath  . Asa [Aspirin] Shortness Of Breath and Other (See Comments)    On high doses of ASA  . Coconut Flavor Shortness Of Breath  . Neomycin Shortness Of Breath  . Peanut-Containing  Drug Products Shortness Of Breath  . Penicillins Shortness Of Breath and Other (See Comments)    Has patient had a PCN reaction causing immediate rash, facial/tongue/throat swelling, SOB or lightheadedness with hypotension: Yes Has patient had a PCN reaction causing severe rash involving mucus membranes or skin necrosis: No Has patient had a PCN reaction that required hospitalization No Has patient had a PCN reaction occurring within the last 10 years: No If all of the above answers are "NO", then may proceed with Cephalosporin use.   . Sulfa Antibiotics Shortness Of Breath  . Tizanidine Shortness Of Breath  . Lisinopril Cough  . Keflex [Cephalexin] Rash    Social History   Social History  . Marital status: Married    Spouse name: N/A  . Number of children: N/A  . Years of education: N/A   Social History Main Topics  . Smoking status: Never Smoker  . Smokeless tobacco: Never Used  . Alcohol use No  . Drug use: No  . Sexual activity: No   Other Topics Concern  . None   Social History Narrative  . None   Family History family history includes Asthma in her daughter; Cancer in her father; Heart attack in her maternal grandmother and mother; Heart disease in her son.  Wt Readings from Last 3 Encounters:  02/18/17 106 lb 3.2 oz (48.2 kg)  02/04/17 110 lb (49.9 kg)  11/25/16 110 lb (49.9 kg)    PHYSICAL EXAM BP (!) 164/78   Pulse 92   Ht 4\' 10"  (1.473 m)   Wt 106 lb 3.2 oz (48.2 kg)   BMI 22.20 kg/m  Physical Exam  Constitutional: She is oriented to person, place, and time. She appears well-developed and well-nourished. No distress.  healthy-appearing.  HENT:  Head: Normocephalic and atraumatic.  Mouth/Throat: No oropharyngeal exudate.  Eyes: EOM are normal.  Neck: Normal range of motion. Neck supple. No hepatojugular reflux and no JVD present. Carotid bruit is not present.  Cardiovascular: Normal rate, S1 normal and S2 normal.  An irregularly irregular rhythm  present. Frequent extrasystoles are present. PMI is not displaced.  Exam reveals no gallop.   Murmur heard.  Medium-pitched harsh crescendo-decrescendo early systolic murmur is present with a grade of 1/6  at the upper right sternal border Pulmonary/Chest: Effort normal and breath sounds normal. No respiratory distress. She has no wheezes. She has no rales.  Abdominal: Soft. Bowel sounds are normal. She exhibits no distension. There is no tenderness. There is no rebound and no guarding.  Musculoskeletal: She exhibits  edema (3-4+ bilateral LE to thighs).  Neurological: She is alert and oriented to person, place, and time.  Skin: Skin is warm.  Left lower extremity has weeping liquid filled blisters. The right leg does not yet have leaking oozing blisters.  Nursing note and vitals reviewed.    Adult ECG Report -- Checked at the end of the visit.  Rate: 92 ;  Rhythm: atrial fibrillation.  Non-specific ST-T abnormalities. Otherwise, normal axis, intervals and durations. No ischemic changes;   Narrative Interpretation: Stable compared to last visit   Other studies Reviewed: Additional studies/ records that were reviewed today include:  Recent Labs:   Lab Results  Component Value Date   CREATININE 0.73 11/26/2016   BUN 12 11/26/2016   NA 124 (L) 11/26/2016   K 4.0 11/26/2016   CL 93 (L) 11/26/2016   CO2 26 11/26/2016   Albumin 2.5**   ASSESSMENT / PLAN: Problem List Items Addressed This Visit    Bilateral lower extremity edema (Chronic)    This is clearly related to venous stasis almond, but also with an albumin of 2.5 is related to protein malnutrition. We talked in great detail about foot elevation and using Ace wraps for now and then converting to the zip-up support stockings. I am leery of increasing her diuretic dose anymore because of her hyponatremia. She is having labs checked by her PCP next week. If her potassium and sodium levels are stable, would consider adding Spiriva  lactone for additional blood pressure control as well as diuresis.  More importantly, however we need to increase her albumin level which means we talked about nutrition supplementation. We discussed several different options for nutrition supplementation as noted below in patient instructions.      Essential hypertension (Chronic)    Her blood pressure is higher today than usual and she is now taking her high dose of hydralazine. I am increasing her diltiazem dose which may potentially help this. She has had issues with low blood pressures as well so I am leery of being too aggressive. The lack of CHF symptoms other than edema would suggest that her lower extremity edema is probably more venous stasis and protein malnutrition related more so than heart failure. Therefore think allowing for mild permissive hypertension may be prudent.      Relevant Medications   diltiazem (CARDIZEM CD) 180 MG 24 hr capsule   apixaban (ELIQUIS) 2.5 MG TABS tablet   apixaban (ELIQUIS) 2.5 MG TABS tablet   Hyponatremia    Hyponatremia probably related to low albumin levels as well. It also makes it very difficult for [place] to use higher dose of diuretic. For now, need to consider a different type of diuretic for instance spironolactone. Labs being followed by PCP next week.      New onset a-fib ([place]) - Primary    I spent half of the visit today (roughly 20 minutes) discussing the pathophysiology of atrial fibrillation, symptoms associated with it and even options as well as the risk of stroke with coagulation versus bleeding risk.  Plan: Based on her lack of true symptoms from A. fib, I think the best course of action is simply rate control. Since her heart rate is 92 bpm I will increase her diltiazem to 180 mg daily. -- We discussed anticoagulation with her CHA2DS2-Vasc score on noted below - recommendation would be full affect regulation with ELIQUIS are Xarelto. I've chosen to use the low-dose 2.5 mg of ELIQUIS  and for her to stop aspirin  when she starts this.    This patients CHA2DS2-VASc Score and unadjusted Ischemic Stroke Rate (% per year) is equal to 7.2 % stroke rate/year from a score of 5 Above score calculated as 1 point each if present [CHF, HTN, DM, Vascular=MI/PAD/Aortic Plaque, Age if 61-74, or Female]; 2 points each if present [Age > 75, or Stroke/TIA/TE]      Relevant Medications   diltiazem (CARDIZEM CD) 180 MG 24 hr capsule   apixaban (ELIQUIS) 2.5 MG TABS tablet   apixaban (ELIQUIS) 2.5 MG TABS tablet   Protein-calorie malnutrition, moderate (HCC) (Chronic)    Significantly reduced allergen level of 2.5 back in July. This is quite likely is a major component leading to her edema. We talked about nutrition supplementation with recommendations noted below.        Although seemingly uncomplicated, based on the patient's advanced age, new diagnosis of A. fib and profound edema with malnutrition, the overall visit was 45 minutes.   > 75% of this time was spent in direct patient counseling/education and discussing treatment options/recommendations.   Current medicines are reviewed at length with the patient today. (+/- concerns) n/a The following changes have been made: start ELIQUIS 2.5 mg twice a day and then stop ASA 81 mg  Patient Instructions  RECOMMENDATION-- FOER LE SWELLING Please purchase--  Compression knee -hi- Try the zippered- kind  - can find at [facility] or possibly at [employer]  If unable to locate can you use ace wraps -legs.  use Gauze over the area that is weeping.   BECAUSE YOU DO NOT HAVE ENOUGH PROTEIN IN YOUR BODY TRY USE THIS TYPE OF SUPPLEMENTS      1) [employer] [facility]ORE-- CAN PURCHASE DAIRY FREE PROTEIN POWDER TO USE       2) CAN USE LACTOSE FREE MILKS- SOY MILK, ALMOND MILK FOR THE              MIXTURE      3) CARNATION BREAKFAST  OF MILK-use soy milk, almond milk   HAVE LAB RESULT SENT TO OFFICE   MEDICATIONS INCREASE DILTIAZEM 180 mg  ONE TABLET DAILY    START  TAKING ELIQUIS 2.5 MG TWICE A DAY, THEN STOP TAKING ASPIRIN DAILY      Your physician wants you to follow-up in JAN 2018 [place]. You will receive a reminder letter in the mail two months in advance. If you don't receive a letter, please call our office to schedule the follow-up appointment.   If you need a refill on your cardiac medications before your next appointment, please call your pharmacy.     Studies Ordered:   No orders of the defined types were placed in this encounter.     Glenetta Hew, M.D., M.S. Interventional Cardiologist   Pager # [phone] Phone # [phone] [address] [place],  [address]

## 2017-02-18 NOTE — Assessment & Plan Note (Signed)
This is clearly related to venous stasis almond, but also with an albumin of 2.5 is related to protein malnutrition. We talked in great detail about foot elevation and using Ace wraps for now and then converting to the zip-up support stockings. I am leery of increasing her diuretic dose anymore because of her hyponatremia. She is having labs checked by her PCP next week. If her potassium and sodium levels are stable, would consider adding Spiriva lactone for additional blood pressure control as well as diuresis.  More importantly, however we need to increase her albumin level which means we talked about nutrition supplementation. We discussed several different options for nutrition supplementation as noted below in patient instructions.

## 2017-02-19 ENCOUNTER — Ambulatory Visit: Payer: Medicare HMO | Admitting: Cardiology

## 2017-02-22 DIAGNOSIS — J4530 Mild persistent asthma, uncomplicated: Secondary | ICD-10-CM | POA: Diagnosis not present

## 2017-02-22 DIAGNOSIS — J309 Allergic rhinitis, unspecified: Secondary | ICD-10-CM | POA: Diagnosis not present

## 2017-02-23 NOTE — Addendum Note (Signed)
Addended by: Zebedee Iba on: 02/23/2017 08:10 AM   Modules accepted: Orders

## 2017-02-25 DIAGNOSIS — E039 Hypothyroidism, unspecified: Secondary | ICD-10-CM | POA: Diagnosis not present

## 2017-02-28 ENCOUNTER — Other Ambulatory Visit: Payer: Self-pay | Admitting: Cardiology

## 2017-03-04 DIAGNOSIS — Z23 Encounter for immunization: Secondary | ICD-10-CM | POA: Diagnosis not present

## 2017-03-04 DIAGNOSIS — E782 Mixed hyperlipidemia: Secondary | ICD-10-CM | POA: Diagnosis not present

## 2017-03-04 DIAGNOSIS — E039 Hypothyroidism, unspecified: Secondary | ICD-10-CM | POA: Diagnosis not present

## 2017-03-29 ENCOUNTER — Ambulatory Visit: Payer: Medicare HMO | Admitting: Podiatry

## 2017-04-13 DIAGNOSIS — M25512 Pain in left shoulder: Secondary | ICD-10-CM | POA: Diagnosis not present

## 2017-04-13 DIAGNOSIS — M25511 Pain in right shoulder: Secondary | ICD-10-CM | POA: Diagnosis not present

## 2017-04-13 DIAGNOSIS — I129 Hypertensive chronic kidney disease with stage 1 through stage 4 chronic kidney disease, or unspecified chronic kidney disease: Secondary | ICD-10-CM | POA: Diagnosis not present

## 2017-04-19 ENCOUNTER — Ambulatory Visit (INDEPENDENT_AMBULATORY_CARE_PROVIDER_SITE_OTHER): Payer: Medicare HMO | Admitting: Podiatry

## 2017-04-19 ENCOUNTER — Encounter: Payer: Self-pay | Admitting: Podiatry

## 2017-04-19 DIAGNOSIS — D689 Coagulation defect, unspecified: Secondary | ICD-10-CM

## 2017-04-19 DIAGNOSIS — B351 Tinea unguium: Secondary | ICD-10-CM

## 2017-04-19 DIAGNOSIS — M79675 Pain in left toe(s): Secondary | ICD-10-CM | POA: Diagnosis not present

## 2017-04-19 DIAGNOSIS — M79674 Pain in right toe(s): Secondary | ICD-10-CM

## 2017-04-19 NOTE — Progress Notes (Signed)
   Subjective:    Patient ID: Jasmin Lee, female    DOB: [birth year], 83 y.o.   MRN: [id number]  HPI    Review of Systems  All other systems reviewed and are negative.      Objective:   Physical Exam        Assessment & Plan:

## 2017-04-19 NOTE — Progress Notes (Signed)
Patient ID: Jasmin Lee, female   DOB: Jul 01, 1922, 83 y.o.   MRN: [id number]   Subjective: Patient presents today stating her toenails are thick and elongated and are all comfortable walking wearing shoes. Patient states she is unable to trim the toenails are self. This patient was last evaluated treated on 12/21/2016 for symptomatic onychomycoses Patient's caregiver present in the treatment room today  Objective: Responsive orientated 3 DP pulses 2/4 bilaterally PT pulses 2/4 bilaterally Capillary reflex within normal limits bilaterally Sensation to 10 g monofilament wire intact 8/8 bilaterally Vibratory sensation reactive bilaterally Ankle reflexes reactive bilaterally No open skin lesions bilaterally Atrophic skin with absent hair growth bilaterally Atrophic fad pad MPJ and heels bilaterally The toenails elongated, brittle, deformed, hypertrophic, discolored and tender direct palpation 6-10 Hammertoe second bilaterally HAV left Manual motor testing dorsi flexion, plantar flexion 5/5 bilaterally  Assessment: Satisfactory neurovascular status Blood clotting disorder associated with Eliquis Symptomatic mycotic toenails 6-10  Plan: Debridement toenails 6-10 mechanically and electrically without any bleeding  Reappoint 3 months

## 2017-04-22 DIAGNOSIS — R601 Generalized edema: Secondary | ICD-10-CM | POA: Diagnosis not present

## 2017-04-22 DIAGNOSIS — M25522 Pain in left elbow: Secondary | ICD-10-CM | POA: Diagnosis not present

## 2017-04-22 DIAGNOSIS — M7989 Other specified soft tissue disorders: Secondary | ICD-10-CM | POA: Diagnosis not present

## 2017-04-26 DIAGNOSIS — M7989 Other specified soft tissue disorders: Secondary | ICD-10-CM | POA: Diagnosis not present

## 2017-04-29 DIAGNOSIS — R600 Localized edema: Secondary | ICD-10-CM | POA: Diagnosis not present

## 2017-04-29 DIAGNOSIS — M7989 Other specified soft tissue disorders: Secondary | ICD-10-CM | POA: Diagnosis not present

## 2017-04-29 DIAGNOSIS — R601 Generalized edema: Secondary | ICD-10-CM | POA: Diagnosis not present

## 2017-04-29 DIAGNOSIS — M25522 Pain in left elbow: Secondary | ICD-10-CM | POA: Diagnosis not present

## 2017-05-12 ENCOUNTER — Encounter (HOSPITAL_COMMUNITY): Payer: Self-pay | Admitting: Emergency Medicine

## 2017-05-12 ENCOUNTER — Inpatient Hospital Stay (HOSPITAL_COMMUNITY)
Admission: EM | Admit: 2017-05-12 | Discharge: 2017-05-20 | DRG: 193 | Disposition: A | Discharge status: Skilled Nursing Facility | Payer: Medicare HMO | Attending: Family Medicine | Admitting: Family Medicine

## 2017-05-12 ENCOUNTER — Other Ambulatory Visit: Payer: Self-pay

## 2017-05-12 DIAGNOSIS — Z825 Family history of asthma and other chronic lower respiratory diseases: Secondary | ICD-10-CM | POA: Diagnosis not present

## 2017-05-12 DIAGNOSIS — E785 Hyperlipidemia, unspecified: Secondary | ICD-10-CM | POA: Diagnosis present

## 2017-05-12 DIAGNOSIS — J181 Lobar pneumonia, unspecified organism: Secondary | ICD-10-CM | POA: Diagnosis not present

## 2017-05-12 DIAGNOSIS — J9622 Acute and chronic respiratory failure with hypercapnia: Secondary | ICD-10-CM | POA: Diagnosis not present

## 2017-05-12 DIAGNOSIS — Z91018 Allergy to other foods: Secondary | ICD-10-CM

## 2017-05-12 DIAGNOSIS — Z91010 Allergy to peanuts: Secondary | ICD-10-CM

## 2017-05-12 DIAGNOSIS — I499 Cardiac arrhythmia, unspecified: Secondary | ICD-10-CM | POA: Diagnosis not present

## 2017-05-12 DIAGNOSIS — R001 Bradycardia, unspecified: Secondary | ICD-10-CM | POA: Diagnosis not present

## 2017-05-12 DIAGNOSIS — R0602 Shortness of breath: Secondary | ICD-10-CM

## 2017-05-12 DIAGNOSIS — J449 Chronic obstructive pulmonary disease, unspecified: Secondary | ICD-10-CM | POA: Diagnosis present

## 2017-05-12 DIAGNOSIS — J189 Pneumonia, unspecified organism: Secondary | ICD-10-CM | POA: Diagnosis not present

## 2017-05-12 DIAGNOSIS — H10023 Other mucopurulent conjunctivitis, bilateral: Secondary | ICD-10-CM | POA: Diagnosis not present

## 2017-05-12 DIAGNOSIS — H1033 Unspecified acute conjunctivitis, bilateral: Secondary | ICD-10-CM | POA: Diagnosis not present

## 2017-05-12 DIAGNOSIS — I10 Essential (primary) hypertension: Secondary | ICD-10-CM | POA: Diagnosis present

## 2017-05-12 DIAGNOSIS — J441 Chronic obstructive pulmonary disease with (acute) exacerbation: Secondary | ICD-10-CM | POA: Diagnosis not present

## 2017-05-12 DIAGNOSIS — E440 Moderate protein-calorie malnutrition: Secondary | ICD-10-CM | POA: Diagnosis not present

## 2017-05-12 DIAGNOSIS — Z7901 Long term (current) use of anticoagulants: Secondary | ICD-10-CM

## 2017-05-12 DIAGNOSIS — I700 Atherosclerosis of aorta: Secondary | ICD-10-CM | POA: Diagnosis present

## 2017-05-12 DIAGNOSIS — L89312 Pressure ulcer of right buttock, stage 2: Secondary | ICD-10-CM | POA: Diagnosis not present

## 2017-05-12 DIAGNOSIS — Z79890 Hormone replacement therapy (postmenopausal): Secondary | ICD-10-CM | POA: Diagnosis not present

## 2017-05-12 DIAGNOSIS — I482 Chronic atrial fibrillation, unspecified: Secondary | ICD-10-CM | POA: Diagnosis present

## 2017-05-12 DIAGNOSIS — E039 Hypothyroidism, unspecified: Secondary | ICD-10-CM | POA: Diagnosis present

## 2017-05-12 DIAGNOSIS — J9601 Acute respiratory failure with hypoxia: Secondary | ICD-10-CM | POA: Diagnosis not present

## 2017-05-12 DIAGNOSIS — L89152 Pressure ulcer of sacral region, stage 2: Secondary | ICD-10-CM | POA: Diagnosis present

## 2017-05-12 DIAGNOSIS — R278 Other lack of coordination: Secondary | ICD-10-CM | POA: Diagnosis not present

## 2017-05-12 DIAGNOSIS — H7292 Unspecified perforation of tympanic membrane, left ear: Secondary | ICD-10-CM | POA: Diagnosis present

## 2017-05-12 DIAGNOSIS — H1089 Other conjunctivitis: Secondary | ICD-10-CM | POA: Diagnosis present

## 2017-05-12 DIAGNOSIS — E871 Hypo-osmolality and hyponatremia: Secondary | ICD-10-CM | POA: Diagnosis not present

## 2017-05-12 DIAGNOSIS — R112 Nausea with vomiting, unspecified: Secondary | ICD-10-CM

## 2017-05-12 DIAGNOSIS — Z66 Do not resuscitate: Secondary | ICD-10-CM | POA: Diagnosis present

## 2017-05-12 DIAGNOSIS — Z881 Allergy status to other antibiotic agents status: Secondary | ICD-10-CM

## 2017-05-12 DIAGNOSIS — J440 Chronic obstructive pulmonary disease with acute lower respiratory infection: Secondary | ICD-10-CM | POA: Diagnosis not present

## 2017-05-12 DIAGNOSIS — R2689 Other abnormalities of gait and mobility: Secondary | ICD-10-CM | POA: Diagnosis not present

## 2017-05-12 DIAGNOSIS — K219 Gastro-esophageal reflux disease without esophagitis: Secondary | ICD-10-CM | POA: Diagnosis present

## 2017-05-12 DIAGNOSIS — Z882 Allergy status to sulfonamides status: Secondary | ICD-10-CM

## 2017-05-12 DIAGNOSIS — R600 Localized edema: Secondary | ICD-10-CM | POA: Diagnosis present

## 2017-05-12 DIAGNOSIS — E876 Hypokalemia: Secondary | ICD-10-CM | POA: Diagnosis present

## 2017-05-12 DIAGNOSIS — Z888 Allergy status to other drugs, medicaments and biological substances status: Secondary | ICD-10-CM

## 2017-05-12 DIAGNOSIS — Z886 Allergy status to analgesic agent status: Secondary | ICD-10-CM

## 2017-05-12 DIAGNOSIS — Z9102 Food additives allergy status: Secondary | ICD-10-CM

## 2017-05-12 DIAGNOSIS — K5730 Diverticulosis of large intestine without perforation or abscess without bleeding: Secondary | ICD-10-CM | POA: Diagnosis not present

## 2017-05-12 DIAGNOSIS — J9621 Acute and chronic respiratory failure with hypoxia: Secondary | ICD-10-CM | POA: Diagnosis not present

## 2017-05-12 DIAGNOSIS — Z880 Allergy status to penicillin: Secondary | ICD-10-CM | POA: Diagnosis not present

## 2017-05-12 DIAGNOSIS — Z6822 Body mass index (BMI) 22.0-22.9, adult: Secondary | ICD-10-CM | POA: Diagnosis not present

## 2017-05-12 DIAGNOSIS — L8990 Pressure ulcer of unspecified site, unspecified stage: Secondary | ICD-10-CM

## 2017-05-12 DIAGNOSIS — R05 Cough: Secondary | ICD-10-CM | POA: Diagnosis not present

## 2017-05-12 DIAGNOSIS — M6281 Muscle weakness (generalized): Secondary | ICD-10-CM | POA: Diagnosis not present

## 2017-05-12 DIAGNOSIS — J80 Acute respiratory distress syndrome: Secondary | ICD-10-CM | POA: Diagnosis not present

## 2017-05-12 LAB — CBC WITH DIFFERENTIAL/PLATELET
Basophils Absolute: 0 10*3/uL (ref 0.0–0.1)
Basophils Relative: 0 %
EOS PCT: 0 %
Eosinophils Absolute: 0 10*3/uL (ref 0.0–0.7)
HEMATOCRIT: 32 % — AB (ref 36.0–46.0)
Hemoglobin: 11.1 g/dL — ABNORMAL LOW (ref 12.0–15.0)
LYMPHS PCT: 6 %
Lymphs Abs: 0.9 10*3/uL (ref 0.7–4.0)
MCH: 28.6 pg (ref 26.0–34.0)
MCHC: 34.7 g/dL (ref 30.0–36.0)
MCV: 82.5 fL (ref 78.0–100.0)
MONO ABS: 1.4 10*3/uL — AB (ref 0.1–1.0)
MONOS PCT: 9 %
NEUTROS ABS: 13.9 10*3/uL — AB (ref 1.7–7.7)
Neutrophils Relative %: 85 %
PLATELETS: 301 10*3/uL (ref 150–400)
RBC: 3.88 MIL/uL (ref 3.87–5.11)
RDW: 16.6 % — AB (ref 11.5–15.5)
WBC: 16.2 10*3/uL — ABNORMAL HIGH (ref 4.0–10.5)

## 2017-05-12 LAB — I-STAT CG4 LACTIC ACID, ED: Lactic Acid, Venous: 0.98 mmol/L (ref 0.5–1.9)

## 2017-05-12 MED ORDER — IPRATROPIUM-ALBUTEROL 0.5-2.5 (3) MG/3ML IN SOLN
3.0000 mL | RESPIRATORY_TRACT | Status: DC
  Administered 2017-05-13 (×2): 3 mL via RESPIRATORY_TRACT
  Filled 2017-05-12 (×2): qty 3

## 2017-05-12 MED ORDER — CIPROFLOXACIN HCL 0.3 % OP SOLN
2.0000 [drp] | OPHTHALMIC | Status: AC
  Administered 2017-05-13 (×2): 2 [drp] via OPHTHALMIC
  Filled 2017-05-12: qty 2.5

## 2017-05-12 NOTE — ED Triage Notes (Addendum)
Pt arriving via EMS from home complaining of congestion present x 2 days, and left ear congestion with difficulty hearing. Pt has significant yellow secretions in both eyes. Pt has hx of asthma, some wheezing present at this time. Pt ambulatory with walker, lives independently.  134/86 HR 100 Resp 24 100% RA 5mg  albuterol 20g left forearm CBG 132

## 2017-05-12 NOTE — ED Provider Notes (Signed)
[facility] DE[facility] Provider Note: Jasmin Spurling, MD, FACEP  CSN: [id number] MRN: [id number] ARRIVAL: 05/12/17 at 2304 ROOM: [place]  05/12/17 11:36 PM Jasmin Lee is a 83 y.o. female with a history of asthma/COPD.  She is here with a 2-day increase in shortness of breath, productive cough and nasal congestion.  She also had pain in her left ear earlier today.  The pain resolved when her left ear began draining sanguinous material.  Today she is developed profuse yellow discharge from her eyes which is matting her eyelid shut.  This is gotten severe enough that she essentially cannot see to get around her house.  She lives alone and ambulates with a walker.  She is not aware of having a fever.  She denies chills, chest pain, abdominal pain, nausea, vomiting and diarrhea.  She is more short of breath than usual with increased wheezing.  She has been using her inhalers.  She has chronic edema of the lower legs for which she takes Lasix 3 times a week.   Past Medical History:  Diagnosis Date  . Anemia   . Aortic atherosclerosis ([place]) 2015   CTA Chest: Diffuse atherosclerosis of the aorta, great vessels and coronary arteries  . Arthritis    "in my legs and feet" (05/11/2014)  . Cardiomyopathy ([place])   . Chronic bronchitis ([place]) "2-3 times/yr"  . Complication of anesthesia    "started flinching at start of hysterectomy; they had to give me more anesthetic"  . COPD with asthma ([place])   . Dyslipidemia   . Echocardiogram abnormal 12/05/2010   Moderate concentric left ventricular hypertrophy EF > 06% stage I diastolic dysfunction, left atrium mild to moderate dilatation, moderate mitral annular calcification with trace MR. Trace TR.  Marland Kitchen HTN (hypertension)   . Hyponatremia 11/2016  . Pneumonia "several times"  . Squamous carcinoma    "left face and thigh"  . Uterine cancer [facility])     Past Surgical History:    Procedure Laterality Date  . APPENDECTOMY  1954  . BREAST BIOPSY Left    "it was nothing"  . CARDIAC EVENT MONITOR  12/05/10-01/02/11   SINUS RHYTHM  . CATARACT EXTRACTION W/ INTRAOCULAR LENS  IMPLANT, BILATERAL Bilateral   . CHOLECYSTECTOMY  2003  . DILATION AND CURETTAGE OF UTERUS  1966  . SQUAMOUS CELL CARCINOMA EXCISION Left    face and thigh  . TRANSTHORACIC ECHOCARDIOGRAM  04/2014    Normal LV size and function Hyperdynamic left ventricle.Moderate concentric LVH with impaired relaxation.  Mild aortic stenosis  . VAGINAL HYSTERECTOMY  1966    Family History  Problem Relation Age of Onset  . Heart attack Mother   . Cancer Father   . Heart attack Maternal Grandmother   . Heart disease Son   . Asthma Daughter     Social History   Tobacco Use  . Smoking status: Never Smoker  . Smokeless tobacco: Never Used  Substance Use Topics  . Alcohol use: No  . Drug use: No    Prior to Admission medications   Medication Sig Start Date End Date Taking? Authorizing Provider  acetaminophen (TYLENOL) 500 MG tablet Take 1,000 mg by mouth 3 (three) times daily.     [provider]  albuterol (PROVENTIL HFA;VENTOLIN HFA) 108 (90 Base) MCG/ACT inhaler Inhale 2 puffs into the lungs every 6 (six) hours as needed for wheezing or shortness of breath.  [provider]  albuterol (PROVENTIL) (2.5 MG/3ML) 0.083% nebulizer solution Take 2.5 mg by nebulization every 8 (eight) hours as needed for wheezing or shortness of breath.     [provider]  apixaban (ELIQUIS) 2.5 MG TABS tablet Take 1 tablet (2.5 mg total) by mouth 2 (two) times daily. 02/18/17   [name], MD  apixaban (ELIQUIS) 2.5 MG TABS tablet Take 1 tablet (2.5 mg total) by mouth 2 (two) times daily. 02/18/17   [name], MD  atorvastatin (LIPITOR) 10 MG tablet Take 10 mg by mouth every other day.  01/30/13   [provider]  CALCIUM-MAGNESIUM-ZINC PO Take 1 tablet by mouth daily.     [provider]  Cholecalciferol 1000 units tablet Take 1,000 Units by mouth daily.    [provider]  cyanocobalamin 1000 MCG tablet Take 1,000 mcg by mouth daily with breakfast.    [provider]  diltiazem (CARDIZEM CD) 180 MG 24 hr capsule Take 1 capsule (180 mg total) by mouth daily. 02/18/17   [name], MD  furosemide (LASIX) 20 MG tablet Take 20 mg by mouth. Monday, Wednesday, Friday, and Saturday.    [provider]  hydrALAZINE (APRESOLINE) 10 MG tablet TAKE 5 TABLETS DAILY (2 TABLETS IN THE MORNING, 1 TABLET IN THE AFTERNOON AND 2 TABLETS IN THE EVENING) 02/01/17   [name], MD  levothyroxine (SYNTHROID, LEVOTHROID) 25 MCG tablet Take 25 mcg by mouth daily before breakfast.    [provider]  Menthol, Topical Analgesic, (BIOFREEZE) 4 % GEL Apply 1 application topically 3 (three) times daily.    [provider]  montelukast (SINGULAIR) 10 MG tablet Take 10 mg by mouth at bedtime.  12/03/12   [provider]  Multiple Vitamin (MULTIVITAMIN WITH MINERALS) TABS tablet Take 1 tablet by mouth daily with breakfast.     [provider]  Multiple Vitamins-Minerals (OCUVITE-LUTEIN PO) Take 1 tablet by mouth daily.    [provider]  pantoprazole (PROTONIX) 40 MG tablet Take 1 tablet (40 mg total) by mouth daily. 06/13/13   [name], MD  phenazopyridine (PYRIDIUM) 200 MG tablet Take 200 mg by mouth as needed for pain.    [provider]  polyethylene glycol (MIRALAX / GLYCOLAX) packet Take 17 g by mouth daily as needed for mild constipation. 10/08/16   [name], MD  polyvinyl alcohol (ARTIFICIAL TEARS) 1.4 % ophthalmic solution Place 1 drop into both eyes 3 (three) times daily.    [provider]  tiZANidine (ZANAFLEX) 2 MG tablet Take 2 mg by mouth 2 (two) times daily.    [provider]  traMADol (ULTRAM) 50 MG tablet Take 1 tablet (50 mg total) by mouth every 6  (six) hours as needed for moderate pain. 10/08/16   [name], MD  vitamin C (ASCORBIC ACID) 250 MG tablet Take 250 mg by mouth daily.    [provider]    Allergies Almond (diagnostic); Asa [aspirin]; Coconut flavor; Neomycin; Peanut-containing drug products; Penicillins; Sulfa antibiotics; Tizanidine; Lisinopril; and Keflex [cephalexin]   REVIEW OF SYSTEMS  Negative except as noted here or in the History of Present Illness.   PHYSICAL EXAMINATION  Initial Vital Signs Blood pressure 138/68, pulse 100, temperature 98.6 F (37 C), temperature source Oral, resp. rate 19, height 4\' 10"  (1.473 m), weight 49.9 kg (110 lb), SpO2 95 %.  Examination General: Well-developed, well-nourished female in no acute distress; appearance consistent with age of record HENT: normocephalic;  atraumatic; right TM normal; left TM obscured by blood in external auditory canal Eyes: Pupils equal round and pinpoint; extraocular muscles grossly intact; profuse yellow conjunctival discharge with erythema of the eyelids Neck: supple Heart: Irregular rhythm Lungs: Decreased air movement in the bases; rattly cough Abdomen: soft; nondistended; nontender; no masses or hepatosplenomegaly; bowel sounds present Extremities: No deformity; full range of motion; pulses normal; 2+ pitting edema of lower legs Neurologic: Awake, alert and oriented; motor function intact in all extremities and symmetric; no facial droop Skin: Warm and dry Psychiatric: Normal mood and affect   RESULTS  Summary of this visit's results, reviewed by myself:   EKG Interpretation  Date/Time:    Ventricular Rate:    PR Interval:    QRS Duration:   QT Interval:    QTC Calculation:   R Axis:     Text Interpretation:        Laboratory Studies: Results for orders placed or performed during the hospital encounter of 05/12/17 (from the past 24 hour(s))  CBC with Differential     Status: Abnormal   Collection Time: 05/12/17  11:27 PM  Result Value Ref Range   WBC 16.2 (H) 4.0 - 10.5 K/uL   RBC 3.88 3.87 - 5.11 MIL/uL   Hemoglobin 11.1 (L) 12.0 - 15.0 g/dL   HCT 32.0 (L) 36.0 - 46.0 %   MCV 82.5 78.0 - 100.0 fL   MCH 28.6 26.0 - 34.0 pg   MCHC 34.7 30.0 - 36.0 g/dL   RDW 16.6 (H) 11.5 - 15.5 %   Platelets 301 150 - 400 K/uL   Neutrophils Relative % 85 %   Neutro Abs 13.9 (H) 1.7 - 7.7 K/uL   Lymphocytes Relative 6 %   Lymphs Abs 0.9 0.7 - 4.0 K/uL   Monocytes Relative 9 %   Monocytes Absolute 1.4 (H) 0.1 - 1.0 K/uL   Eosinophils Relative 0 %   Eosinophils Absolute 0.0 0.0 - 0.7 K/uL   Basophils Relative 0 %   Basophils Absolute 0.0 0.0 - 0.1 K/uL  Basic metabolic panel     Status: Abnormal   Collection Time: 05/12/17 11:27 PM  Result Value Ref Range   Sodium 130 (L) 135 - 145 mmol/L   Potassium 3.1 (L) 3.5 - 5.1 mmol/L   Chloride 93 (L) 101 - 111 mmol/L   CO2 27 22 - 32 mmol/L   Glucose, Bld 114 (H) 65 - 99 mg/dL   BUN 15 6 - 20 mg/dL   Creatinine, Ser 0.74 0.44 - 1.00 mg/dL   Calcium 8.1 (L) 8.9 - 10.3 mg/dL   GFR calc non Af Amer >60 >60 mL/min   GFR calc Af Amer >60 >60 mL/min   Anion gap 10 5 - 15  I-Stat CG4 Lactic Acid, ED     Status: None   Collection Time: 05/12/17 11:55 PM  Result Value Ref Range   Lactic Acid, Venous 0.98 0.5 - 1.9 mmol/L   Imaging Studies: Dg Chest 2 View  Result Date: 05/13/2017 CLINICAL DATA:  Cough and shortness of breath EXAM: CHEST  2 VIEW COMPARISON:  Chest radiograph 05/11/2014 FINDINGS: There is shallow lung inflation with small bilateral pleural effusions and associated atelectasis. Small area of consolidation in the left lower lobe. Calcific aortic atherosclerosis. IMPRESSION: Small bilateral pleural effusions with associated atelectasis and possible left lower lobe consolidation. The Aortic Atherosclerosis (ICD10-I70.0). Electronically Signed   By: Ulyses Jarred M.D.   On: 05/13/2017 00:28    ED COURSE  Nursing notes and initial vitals signs, including  pulse oximetry, reviewed.  Vitals:   05/12/17 2315 05/13/17 0027  BP: 138/68   Pulse: 100   Resp: 19   Temp: 98.6 F (37 C)   TempSrc: Oral   SpO2: 95% 94%  Weight: 49.9 kg (110 lb)   Height: 4\' 10"  (1.473 m)    11:57 PM Eye exudate cultured.  Ciprofloxacin ophthalmic eyedrops ordered.  12:39 AM Levofloxacin ordered for community-acquired pneumonia.  Patient has a cephalosporin allergy so Rocephin not ordered.  Hospitalist to admit.  PROCEDURES    ED DIAGNOSES     ICD-10-CM   1. Community acquired pneumonia of left lower lobe of lung ([place]) J18.1   2. COPD exacerbation ([place]) J44.1   3. Tympanic membrane perforation, left H72.92   4. Acute bacterial conjunctivitis of both eyes H10.33        Rainer Mounce, Jenny Reichmann, MD 05/13/17 [phone]

## 2017-05-12 NOTE — ED Notes (Signed)
Bed: JJ00 Expected date:  Expected time:  Means of arrival:  Comments: 83 yo F  Congestion

## 2017-05-13 ENCOUNTER — Emergency Department (HOSPITAL_COMMUNITY): Payer: Medicare HMO

## 2017-05-13 DIAGNOSIS — H7292 Unspecified perforation of tympanic membrane, left ear: Secondary | ICD-10-CM | POA: Diagnosis present

## 2017-05-13 DIAGNOSIS — R05 Cough: Secondary | ICD-10-CM | POA: Diagnosis not present

## 2017-05-13 DIAGNOSIS — Z7901 Long term (current) use of anticoagulants: Secondary | ICD-10-CM | POA: Diagnosis not present

## 2017-05-13 DIAGNOSIS — J189 Pneumonia, unspecified organism: Secondary | ICD-10-CM | POA: Diagnosis present

## 2017-05-13 DIAGNOSIS — Z882 Allergy status to sulfonamides status: Secondary | ICD-10-CM | POA: Diagnosis not present

## 2017-05-13 DIAGNOSIS — H1033 Unspecified acute conjunctivitis, bilateral: Secondary | ICD-10-CM

## 2017-05-13 DIAGNOSIS — E876 Hypokalemia: Secondary | ICD-10-CM

## 2017-05-13 DIAGNOSIS — Z6822 Body mass index (BMI) 22.0-22.9, adult: Secondary | ICD-10-CM | POA: Diagnosis not present

## 2017-05-13 DIAGNOSIS — R0602 Shortness of breath: Secondary | ICD-10-CM | POA: Diagnosis not present

## 2017-05-13 DIAGNOSIS — E440 Moderate protein-calorie malnutrition: Secondary | ICD-10-CM

## 2017-05-13 DIAGNOSIS — J9601 Acute respiratory failure with hypoxia: Secondary | ICD-10-CM | POA: Diagnosis not present

## 2017-05-13 DIAGNOSIS — I482 Chronic atrial fibrillation: Secondary | ICD-10-CM | POA: Diagnosis not present

## 2017-05-13 DIAGNOSIS — M6281 Muscle weakness (generalized): Secondary | ICD-10-CM | POA: Diagnosis not present

## 2017-05-13 DIAGNOSIS — L89152 Pressure ulcer of sacral region, stage 2: Secondary | ICD-10-CM | POA: Diagnosis present

## 2017-05-13 DIAGNOSIS — H1089 Other conjunctivitis: Secondary | ICD-10-CM | POA: Diagnosis present

## 2017-05-13 DIAGNOSIS — J9621 Acute and chronic respiratory failure with hypoxia: Secondary | ICD-10-CM | POA: Diagnosis not present

## 2017-05-13 DIAGNOSIS — Z66 Do not resuscitate: Secondary | ICD-10-CM | POA: Diagnosis present

## 2017-05-13 DIAGNOSIS — I10 Essential (primary) hypertension: Secondary | ICD-10-CM

## 2017-05-13 DIAGNOSIS — R278 Other lack of coordination: Secondary | ICD-10-CM | POA: Diagnosis not present

## 2017-05-13 DIAGNOSIS — K219 Gastro-esophageal reflux disease without esophagitis: Secondary | ICD-10-CM | POA: Diagnosis not present

## 2017-05-13 DIAGNOSIS — E785 Hyperlipidemia, unspecified: Secondary | ICD-10-CM | POA: Diagnosis present

## 2017-05-13 DIAGNOSIS — J441 Chronic obstructive pulmonary disease with (acute) exacerbation: Secondary | ICD-10-CM

## 2017-05-13 DIAGNOSIS — J9622 Acute and chronic respiratory failure with hypercapnia: Secondary | ICD-10-CM | POA: Diagnosis not present

## 2017-05-13 DIAGNOSIS — R600 Localized edema: Secondary | ICD-10-CM | POA: Diagnosis present

## 2017-05-13 DIAGNOSIS — Z79890 Hormone replacement therapy (postmenopausal): Secondary | ICD-10-CM | POA: Diagnosis not present

## 2017-05-13 DIAGNOSIS — J80 Acute respiratory distress syndrome: Secondary | ICD-10-CM | POA: Diagnosis not present

## 2017-05-13 DIAGNOSIS — L8990 Pressure ulcer of unspecified site, unspecified stage: Secondary | ICD-10-CM

## 2017-05-13 DIAGNOSIS — R2689 Other abnormalities of gait and mobility: Secondary | ICD-10-CM | POA: Diagnosis not present

## 2017-05-13 DIAGNOSIS — L89312 Pressure ulcer of right buttock, stage 2: Secondary | ICD-10-CM | POA: Diagnosis not present

## 2017-05-13 DIAGNOSIS — I700 Atherosclerosis of aorta: Secondary | ICD-10-CM | POA: Diagnosis present

## 2017-05-13 DIAGNOSIS — K5730 Diverticulosis of large intestine without perforation or abscess without bleeding: Secondary | ICD-10-CM | POA: Diagnosis not present

## 2017-05-13 DIAGNOSIS — Z880 Allergy status to penicillin: Secondary | ICD-10-CM | POA: Diagnosis not present

## 2017-05-13 DIAGNOSIS — J440 Chronic obstructive pulmonary disease with acute lower respiratory infection: Secondary | ICD-10-CM | POA: Diagnosis not present

## 2017-05-13 DIAGNOSIS — Z825 Family history of asthma and other chronic lower respiratory diseases: Secondary | ICD-10-CM | POA: Diagnosis not present

## 2017-05-13 DIAGNOSIS — J181 Lobar pneumonia, unspecified organism: Secondary | ICD-10-CM | POA: Diagnosis not present

## 2017-05-13 DIAGNOSIS — E039 Hypothyroidism, unspecified: Secondary | ICD-10-CM | POA: Diagnosis not present

## 2017-05-13 DIAGNOSIS — E871 Hypo-osmolality and hyponatremia: Secondary | ICD-10-CM | POA: Diagnosis not present

## 2017-05-13 LAB — CBC WITH DIFFERENTIAL/PLATELET
Basophils Absolute: 0 10*3/uL (ref 0.0–0.1)
Basophils Relative: 0 %
EOS PCT: 0 %
Eosinophils Absolute: 0 10*3/uL (ref 0.0–0.7)
HEMATOCRIT: 32.1 % — AB (ref 36.0–46.0)
HEMOGLOBIN: 10.9 g/dL — AB (ref 12.0–15.0)
LYMPHS ABS: 0.9 10*3/uL (ref 0.7–4.0)
LYMPHS PCT: 4 %
MCH: 28.2 pg (ref 26.0–34.0)
MCHC: 34 g/dL (ref 30.0–36.0)
MCV: 82.9 fL (ref 78.0–100.0)
Monocytes Absolute: 1 10*3/uL (ref 0.1–1.0)
Monocytes Relative: 5 %
NEUTROS ABS: 18 10*3/uL — AB (ref 1.7–7.7)
NEUTROS PCT: 91 %
Platelets: 298 10*3/uL (ref 150–400)
RBC: 3.87 MIL/uL (ref 3.87–5.11)
RDW: 16.8 % — ABNORMAL HIGH (ref 11.5–15.5)
WBC: 19.9 10*3/uL — AB (ref 4.0–10.5)

## 2017-05-13 LAB — BASIC METABOLIC PANEL
ANION GAP: 10 (ref 5–15)
BUN: 15 mg/dL (ref 6–20)
CHLORIDE: 93 mmol/L — AB (ref 101–111)
CO2: 27 mmol/L (ref 22–32)
Calcium: 8.1 mg/dL — ABNORMAL LOW (ref 8.9–10.3)
Creatinine, Ser: 0.74 mg/dL (ref 0.44–1.00)
GFR calc Af Amer: >60 mL/min (ref >60)
GFR calc non Af Amer: >60 mL/min (ref >60)
Glucose, Bld: 114 mg/dL — ABNORMAL HIGH (ref 65–99)
POTASSIUM: 3.1 mmol/L — AB (ref 3.5–5.1)
SODIUM: 130 mmol/L — AB (ref 135–145)

## 2017-05-13 LAB — RESPIRATORY PANEL BY PCR
Adenovirus: NOT DETECTED
BORDETELLA PERTUSSIS-RVPCR: NOT DETECTED
Chlamydophila pneumoniae: NOT DETECTED
Coronavirus 229E: NOT DETECTED
Coronavirus HKU1: NOT DETECTED
Coronavirus NL63: NOT DETECTED
Coronavirus OC43: NOT DETECTED
INFLUENZA B-RVPPCR: NOT DETECTED
Influenza A: NOT DETECTED
Metapneumovirus: NOT DETECTED
Mycoplasma pneumoniae: NOT DETECTED
PARAINFLUENZA VIRUS 2-RVPPCR: NOT DETECTED
PARAINFLUENZA VIRUS 3-RVPPCR: NOT DETECTED
Parainfluenza Virus 1: NOT DETECTED
Parainfluenza Virus 4: NOT DETECTED
RESPIRATORY SYNCYTIAL VIRUS-RVPPCR: NOT DETECTED
RHINOVIRUS / ENTEROVIRUS - RVPPCR: NOT DETECTED

## 2017-05-13 LAB — COMPREHENSIVE METABOLIC PANEL
ALK PHOS: 99 U/L (ref 38–126)
ALT: 24 U/L (ref 14–54)
ANION GAP: 8 (ref 5–15)
AST: 36 U/L (ref 15–41)
Albumin: 2.2 g/dL — ABNORMAL LOW (ref 3.5–5.0)
BUN: 15 mg/dL (ref 6–20)
CHLORIDE: 94 mmol/L — AB (ref 101–111)
CO2: 27 mmol/L (ref 22–32)
Calcium: 7.6 mg/dL — ABNORMAL LOW (ref 8.9–10.3)
Creatinine, Ser: 0.68 mg/dL (ref 0.44–1.00)
GFR calc non Af Amer: >60 mL/min (ref >60)
Glucose, Bld: 119 mg/dL — ABNORMAL HIGH (ref 65–99)
Potassium: 3.7 mmol/L (ref 3.5–5.1)
SODIUM: 129 mmol/L — AB (ref 135–145)
Total Bilirubin: 0.9 mg/dL (ref 0.3–1.2)
Total Protein: 4.6 g/dL — ABNORMAL LOW (ref 6.5–8.1)

## 2017-05-13 MED ORDER — TRAMADOL HCL 50 MG PO TABS: 50.0000 mg | ORAL_TABLET | Freq: Four times a day (QID) | ORAL | Status: DC | PRN

## 2017-05-13 MED ORDER — POLYVINYL ALCOHOL 1.4 % OP SOLN
1.0000 [drp] | Freq: Three times a day (TID) | OPHTHALMIC | Status: DC
  Administered 2017-05-13 – 2017-05-20 (×15): 1 [drp] via OPHTHALMIC
  Filled 2017-05-13: qty 15

## 2017-05-13 MED ORDER — MAGNESIUM OXIDE 400 (241.3 MG) MG PO TABS
400.0000 mg | ORAL_TABLET | Freq: Once | ORAL | Status: AC
  Administered 2017-05-13: 400 mg via ORAL
  Filled 2017-05-13: qty 1

## 2017-05-13 MED ORDER — LEVOFLOXACIN IN D5W 750 MG/150ML IV SOLN
750.0000 mg | INTRAVENOUS | Status: DC
  Filled 2017-05-13: qty 150

## 2017-05-13 MED ORDER — TIZANIDINE HCL 4 MG PO TABS: 2.0000 mg | ORAL_TABLET | Freq: Two times a day (BID) | ORAL | Status: DC

## 2017-05-13 MED ORDER — LABETALOL HCL 5 MG/ML IV SOLN
10.0000 mg | Freq: Once | INTRAVENOUS | Status: AC
  Administered 2017-05-13: 10 mg via INTRAVENOUS
  Filled 2017-05-13: qty 4

## 2017-05-13 MED ORDER — POTASSIUM CHLORIDE CRYS ER 20 MEQ PO TBCR
40.0000 meq | EXTENDED_RELEASE_TABLET | Freq: Once | ORAL | Status: AC
  Administered 2017-05-13: 40 meq via ORAL
  Filled 2017-05-13: qty 2

## 2017-05-13 MED ORDER — SODIUM CHLORIDE 0.9 % IV SOLN
INTRAVENOUS | Status: DC
  Administered 2017-05-13: 09:00:00 via INTRAVENOUS

## 2017-05-13 MED ORDER — ATORVASTATIN CALCIUM 10 MG PO TABS
10.0000 mg | ORAL_TABLET | ORAL | Status: DC
  Administered 2017-05-19: 10 mg via ORAL
  Filled 2017-05-13 (×3): qty 1

## 2017-05-13 MED ORDER — LEVOFLOXACIN IN D5W 750 MG/150ML IV SOLN
750.0000 mg | INTRAVENOUS | Status: DC
  Administered 2017-05-13: 750 mg via INTRAVENOUS
  Filled 2017-05-13: qty 150

## 2017-05-13 MED ORDER — ORAL CARE MOUTH RINSE
15.0000 mL | Freq: Two times a day (BID) | OROMUCOSAL | Status: DC
  Administered 2017-05-14 – 2017-05-19 (×6): 15 mL via OROMUCOSAL

## 2017-05-13 MED ORDER — IPRATROPIUM-ALBUTEROL 0.5-2.5 (3) MG/3ML IN SOLN
3.0000 mL | Freq: Three times a day (TID) | RESPIRATORY_TRACT | Status: DC
  Administered 2017-05-13 – 2017-05-20 (×24): 3 mL via RESPIRATORY_TRACT
  Filled 2017-05-13 (×24): qty 3

## 2017-05-13 MED ORDER — CHLORHEXIDINE GLUCONATE 0.12 % MT SOLN
15.0000 mL | Freq: Two times a day (BID) | OROMUCOSAL | Status: DC
  Administered 2017-05-14 – 2017-05-19 (×9): 15 mL via OROMUCOSAL
  Filled 2017-05-13 (×6): qty 15

## 2017-05-13 MED ORDER — LEVOTHYROXINE SODIUM 25 MCG PO TABS
25.0000 ug | ORAL_TABLET | Freq: Every day | ORAL | Status: DC
  Administered 2017-05-17 – 2017-05-19 (×3): 25 ug via ORAL
  Filled 2017-05-13 (×5): qty 1

## 2017-05-13 MED ORDER — VITAMIN D3 25 MCG (1000 UNIT) PO TABS
1000.0000 [IU] | ORAL_TABLET | Freq: Every day | ORAL | Status: DC
  Administered 2017-05-19: 1000 [IU] via ORAL
  Filled 2017-05-13 (×5): qty 1

## 2017-05-13 MED ORDER — VITAMIN C 250 MG PO TABS: 250.0000 mg | ORAL_TABLET | Freq: Every day | ORAL | Status: DC

## 2017-05-13 MED ORDER — LEVOFLOXACIN IN D5W 750 MG/150ML IV SOLN
750.0000 mg | Freq: Once | INTRAVENOUS | Status: AC
  Administered 2017-05-13: 750 mg via INTRAVENOUS
  Filled 2017-05-13: qty 150

## 2017-05-13 MED ORDER — DILTIAZEM HCL ER COATED BEADS 180 MG PO CP24
180.0000 mg | ORAL_CAPSULE | Freq: Every day | ORAL | Status: DC
  Administered 2017-05-13 – 2017-05-19 (×7): 180 mg via ORAL
  Filled 2017-05-13 (×7): qty 1

## 2017-05-13 MED ORDER — MENTHOL (TOPICAL ANALGESIC) 4 % EX GEL: 1.0000 "application " | Freq: Three times a day (TID) | CUTANEOUS | Status: DC

## 2017-05-13 MED ORDER — APIXABAN 2.5 MG PO TABS
2.5000 mg | ORAL_TABLET | Freq: Two times a day (BID) | ORAL | Status: DC
  Administered 2017-05-13 – 2017-05-19 (×11): 2.5 mg via ORAL
  Filled 2017-05-13 (×12): qty 1

## 2017-05-13 MED ORDER — FUROSEMIDE 20 MG PO TABS
20.0000 mg | ORAL_TABLET | Freq: Every day | ORAL | Status: DC
  Administered 2017-05-13 – 2017-05-19 (×5): 20 mg via ORAL
  Filled 2017-05-13 (×6): qty 1

## 2017-05-13 MED ORDER — IPRATROPIUM-ALBUTEROL 0.5-2.5 (3) MG/3ML IN SOLN: 3.0000 mL | Freq: Four times a day (QID) | RESPIRATORY_TRACT | Status: DC

## 2017-05-13 MED ORDER — MONTELUKAST SODIUM 10 MG PO TABS
10.0000 mg | ORAL_TABLET | Freq: Every day | ORAL | Status: DC
  Administered 2017-05-13 – 2017-05-18 (×2): 10 mg via ORAL
  Filled 2017-05-13 (×3): qty 1

## 2017-05-13 MED ORDER — HYDRALAZINE HCL 10 MG PO TABS
10.0000 mg | ORAL_TABLET | Freq: Three times a day (TID) | ORAL | Status: DC
  Administered 2017-05-13 – 2017-05-20 (×15): 10 mg via ORAL
  Filled 2017-05-13 (×24): qty 1

## 2017-05-13 MED ORDER — ERYTHROMYCIN 5 MG/GM OP OINT
TOPICAL_OINTMENT | Freq: Four times a day (QID) | OPHTHALMIC | Status: DC
  Administered 2017-05-13 – 2017-05-19 (×26): via OPHTHALMIC
  Administered 2017-05-20: 1 via OPHTHALMIC
  Administered 2017-05-20 (×3): via OPHTHALMIC
  Filled 2017-05-13 (×2): qty 3.5

## 2017-05-13 MED ORDER — CHOLECALCIFEROL 25 MCG (1000 UT) PO TABS: 1000.0000 [IU] | ORAL_TABLET | Freq: Every day | ORAL | Status: DC

## 2017-05-13 MED ORDER — PANTOPRAZOLE SODIUM 40 MG PO TBEC
40.0000 mg | DELAYED_RELEASE_TABLET | Freq: Every day | ORAL | Status: DC
  Administered 2017-05-13 – 2017-05-19 (×3): 40 mg via ORAL
  Filled 2017-05-13 (×5): qty 1

## 2017-05-13 MED ORDER — ADULT MULTIVITAMIN W/MINERALS CH
1.0000 | ORAL_TABLET | Freq: Every day | ORAL | Status: DC
  Administered 2017-05-13 – 2017-05-19 (×2): 1 via ORAL
  Filled 2017-05-13 (×6): qty 1

## 2017-05-13 MED ORDER — ACETAMINOPHEN 500 MG PO TABS
1000.0000 mg | ORAL_TABLET | Freq: Three times a day (TID) | ORAL | Status: DC
  Administered 2017-05-13: 1000 mg via ORAL
  Administered 2017-05-16: 500 mg via ORAL
  Administered 2017-05-19: 1000 mg via ORAL
  Filled 2017-05-13 (×9): qty 2

## 2017-05-13 MED ORDER — VITAMIN B-12 1000 MCG PO TABS
1000.0000 ug | ORAL_TABLET | Freq: Every day | ORAL | Status: DC
  Administered 2017-05-19: 1000 ug via ORAL
  Filled 2017-05-13 (×5): qty 1

## 2017-05-13 MED ORDER — DM-GUAIFENESIN ER 30-600 MG PO TB12
1.0000 | ORAL_TABLET | Freq: Two times a day (BID) | ORAL | Status: DC | PRN
  Administered 2017-05-15 – 2017-05-16 (×2): 1 via ORAL
  Filled 2017-05-13 (×3): qty 1

## 2017-05-13 MED ORDER — POLYETHYLENE GLYCOL 3350 17 G PO PACK: 17.0000 g | PACK | Freq: Every day | ORAL | Status: DC | PRN

## 2017-05-13 NOTE — ED Notes (Signed)
Assigned 1506 @ 12:15

## 2017-05-13 NOTE — Progress Notes (Signed)
PHARMACY NOTE:  ANTIMICROBIAL RENAL DOSAGE ADJUSTMENT  Current antimicrobial regimen includes a mismatch between antimicrobial dosage and estimated renal function.  As per policy approved by the Pharmacy & Therapeutics and Medical Executive Committees, the antimicrobial dosage will be adjusted accordingly.  Current antimicrobial dosage:  Levaquin 750mg  IV q24h x 5 days  Indication: CAP  Renal Function: CrCl 31 ml/min  Estimated Creatinine Clearance: 31.5 mL/min (by C-G formula based on SCr of 0.68 mg/dL). []      On intermittent HD, scheduled: []      On CRRT    Antimicrobial dosage has been changed to:  750mg  IV q48h - next dose 12/22  Additional comments: therapy ordered x 5 days, order entered x 2 more doses which will complete 5 days of therapy   Thank you for allowing pharmacy to be a part of this patient's care.  Peggyann Juba, PharmD, BCPS Pager: [phone] 05/13/2017 1:58 PM

## 2017-05-13 NOTE — ED Notes (Signed)
ED TO INPATIENT HANDOFF REPORT  Name/Age/Gender Jasmin Lee 83 y.o. female  Code Status    Code Status Orders  (From admission, onward)        Start     Ordered   05/13/17 0621  Do not attempt resuscitation (DNR)  Continuous    Question Answer Comment  In the event of cardiac or respiratory ARREST Do not call a "code blue"   In the event of cardiac or respiratory ARREST Do not perform Intubation, CPR, defibrillation or ACLS   In the event of cardiac or respiratory ARREST Use medication by any route, position, wound care, and other measures to relive pain and suffering. May use oxygen, suction and manual treatment of airway obstruction as needed for comfort.      05/13/17 0620    Code Status History    Date Active Date Inactive Code Status Order ID Comments User Context   11/24/2016 18:24 11/27/2016 15:51 DNR [id number]  Louellen Molder, MD Inpatient   10/05/2016 16:18 10/08/2016 20:51 Full Code [id number]  [name], PA-C ED   05/11/2014 15:22 05/12/2014 20:48 Full Code [id number]  [name], MD Inpatient   06/12/2013 02:28 06/13/2013 18:46 Partial Code [id number]  [name], PA-C Inpatient   06/11/2013 22:03 06/12/2013 02:28 Full Code [id number]  [name], MD Inpatient      Home/SNF/Other Home  Chief Complaint shortness of breath  Level of Care/Admitting Diagnosis ED Disposition    ED Disposition Condition [place] [facility] Area: [facility] [[id number]]  Level of Care: Telemetry [5]  Admit to tele based on following criteria: Complex arrhythmia (Bradycardia/Tachycardia)  Diagnosis: CAP (community acquired pneumonia) [518841]  Admitting Physician: Gerlean Ren [facility] [[id number]]  Attending Physician: Gerlean Ren [facility] [[id number]]  Estimated length of stay: past midnight tomorrow  Certification:: I certify this patient will need inpatient services for at least 2 midnights  PT Class (Do Not Modify): Inpatient [101]  PT Acc  Code (Do Not Modify): Private [1]       Medical History Past Medical History:  Diagnosis Date  . Anemia   . Aortic atherosclerosis ([place]) 2015   CTA Chest: Diffuse atherosclerosis of the aorta, great vessels and coronary arteries  . Arthritis    "in my legs and feet" (05/11/2014)  . Cardiomyopathy ([place])   . Chronic bronchitis ([place]) "2-3 times/yr"  . Complication of anesthesia    "started flinching at start of hysterectomy; they had to give me more anesthetic"  . COPD with asthma ([place])   . Dyslipidemia   . Echocardiogram abnormal 12/05/2010   Moderate concentric left ventricular hypertrophy EF > 35% stage I diastolic dysfunction, left atrium mild to moderate dilatation, moderate mitral annular calcification with trace MR. Trace TR.  Marland Kitchen HTN (hypertension)   . Hyponatremia 11/2016  . Pneumonia "several times"  . Squamous carcinoma    "left face and thigh"  . Uterine cancer ([facility])     Allergies Allergies  Allergen Reactions  . Almond (Diagnostic) Shortness Of Breath  . Asa [Aspirin] Shortness Of Breath and Other (See Comments)    On high doses of ASA  . Coconut Flavor Shortness Of Breath  . Neomycin Shortness Of Breath  . Peanut-Containing Drug Products Shortness Of Breath  . Penicillins Shortness Of Breath and Other (See Comments)    Has patient had a PCN reaction causing immediate rash, facial/tongue/throat swelling, SOB or lightheadedness with hypotension: Yes Has patient had a PCN reaction causing severe rash  involving mucus membranes or skin necrosis: No Has patient had a PCN reaction that required hospitalization No Has patient had a PCN reaction occurring within the last 10 years: No If all of the above answers are "NO", then may proceed with Cephalosporin use.   . Sulfa Antibiotics Shortness Of Breath  . Tizanidine Shortness Of Breath  . Lisinopril Cough  . Keflex [Cephalexin] Rash    IV Location/Drains/Wounds Patient Lines/Drains/Airways Status   Active  Line/Drains/Airways    Name:   Placement date:   Placement time:   Site:   Days:   Peripheral IV 05/12/17 Left Forearm   05/12/17    -    Forearm   1          Labs/Imaging Results for orders placed or performed during the hospital encounter of 05/12/17 (from the past 48 hour(s))  CBC with Differential     Status: Abnormal   Collection Time: 05/12/17 11:27 PM  Result Value Ref Range   WBC 16.2 (H) 4.0 - 10.5 K/uL   RBC 3.88 3.87 - 5.11 MIL/uL   Hemoglobin 11.1 (L) 12.0 - 15.0 g/dL   HCT 32.0 (L) 36.0 - 46.0 %   MCV 82.5 78.0 - 100.0 fL   MCH 28.6 26.0 - 34.0 pg   MCHC 34.7 30.0 - 36.0 g/dL   RDW 16.6 (H) 11.5 - 15.5 %   Platelets 301 150 - 400 K/uL   Neutrophils Relative % 85 %   Neutro Abs 13.9 (H) 1.7 - 7.7 K/uL   Lymphocytes Relative 6 %   Lymphs Abs 0.9 0.7 - 4.0 K/uL   Monocytes Relative 9 %   Monocytes Absolute 1.4 (H) 0.1 - 1.0 K/uL   Eosinophils Relative 0 %   Eosinophils Absolute 0.0 0.0 - 0.7 K/uL   Basophils Relative 0 %   Basophils Absolute 0.0 0.0 - 0.1 K/uL  Basic metabolic panel     Status: Abnormal   Collection Time: 05/12/17 11:27 PM  Result Value Ref Range   Sodium 130 (L) 135 - 145 mmol/L   Potassium 3.1 (L) 3.5 - 5.1 mmol/L   Chloride 93 (L) 101 - 111 mmol/L   CO2 27 22 - 32 mmol/L   Glucose, Bld 114 (H) 65 - 99 mg/dL   BUN 15 6 - 20 mg/dL   Creatinine, Ser 0.74 0.44 - 1.00 mg/dL   Calcium 8.1 (L) 8.9 - 10.3 mg/dL   GFR calc non Af Amer >60 >60 mL/min   GFR calc Af Amer >60 >60 mL/min    Comment: (NOTE) The eGFR has been calculated using the CKD EPI equation. This calculation has not been validated in all clinical situations. eGFR's persistently <60 mL/min signify possible Chronic Kidney Disease.    Anion gap 10 5 - 15  I-Stat CG4 Lactic Acid, ED     Status: None   Collection Time: 05/12/17 11:55 PM  Result Value Ref Range   Lactic Acid, Venous 0.98 0.5 - 1.9 mmol/L  Respiratory Panel by PCR     Status: None   Collection Time: 05/13/17  3:56  AM  Result Value Ref Range   Adenovirus NOT DETECTED NOT DETECTED   Coronavirus 229E NOT DETECTED NOT DETECTED   Coronavirus HKU1 NOT DETECTED NOT DETECTED   Coronavirus NL63 NOT DETECTED NOT DETECTED   Coronavirus OC43 NOT DETECTED NOT DETECTED   Metapneumovirus NOT DETECTED NOT DETECTED   Rhinovirus / Enterovirus NOT DETECTED NOT DETECTED   Influenza A NOT DETECTED NOT DETECTED  Influenza B NOT DETECTED NOT DETECTED   Parainfluenza Virus 1 NOT DETECTED NOT DETECTED   Parainfluenza Virus 2 NOT DETECTED NOT DETECTED   Parainfluenza Virus 3 NOT DETECTED NOT DETECTED   Parainfluenza Virus 4 NOT DETECTED NOT DETECTED   Respiratory Syncytial Virus NOT DETECTED NOT DETECTED   Bordetella pertussis NOT DETECTED NOT DETECTED   Chlamydophila pneumoniae NOT DETECTED NOT DETECTED   Mycoplasma pneumoniae NOT DETECTED NOT DETECTED   Dg Chest 2 View  Result Date: 05/13/2017 CLINICAL DATA:  Cough and shortness of breath EXAM: CHEST  2 VIEW COMPARISON:  Chest radiograph 05/11/2014 FINDINGS: There is shallow lung inflation with small bilateral pleural effusions and associated atelectasis. Small area of consolidation in the left lower lobe. Calcific aortic atherosclerosis. IMPRESSION: Small bilateral pleural effusions with associated atelectasis and possible left lower lobe consolidation. The Aortic Atherosclerosis (ICD10-I70.0). Electronically Signed   By: Ulyses Jarred M.D.   On: 05/13/2017 00:28    Pending Labs Unresulted Labs (From admission, onward)   Start     Ordered   05/13/17 0621  HIV antibody  Once,   R     05/13/17 0620   05/13/17 0621  Culture, blood (routine x 2) Call MD if unable to obtain prior to antibiotics being given  BLOOD CULTURE X 2,   R    Comments:  If blood cultures drawn in Emergency Department - Do not draw and cancel order    05/13/17 0620   05/13/17 0621  Culture, sputum-assessment  Once,   R     05/13/17 0620   05/13/17 0621  Gram stain  Once,   R     05/13/17 0620    05/13/17 0621  Strep pneumoniae urinary antigen  Once,   R     05/13/17 0620   05/13/17 0621  Comprehensive metabolic panel  Tomorrow morning,   R     05/13/17 0620   05/13/17 0011  Eye culture  Once,   R     05/13/17 0011   05/12/17 2349  CBC with Differential/Platelet  Once,   R     05/12/17 2352      Vitals/Pain Today's Vitals   05/13/17 0615 05/13/17 0630 05/13/17 0645 05/13/17 0733  BP: 136/68 139/74 (!) 141/72   Pulse: 85 95 88   Resp: 15 18 20    Temp:      TempSrc:      SpO2: 92% 93% 93% 98%  Weight:      Height:        Isolation Precautions Contact and Enteric precautions (UV disinfection)  Medications Medications  montelukast (SINGULAIR) tablet 10 mg (not administered)  atorvastatin (LIPITOR) tablet 10 mg (not administered)  acetaminophen (TYLENOL) tablet 1,000 mg (not administered)  multivitamin with minerals tablet 1 tablet (1 tablet Oral Refused 05/13/17 0854)  pantoprazole (PROTONIX) EC tablet 40 mg (not administered)  vitamin C (ASCORBIC ACID) tablet 250 mg (not administered)  polyvinyl alcohol (LIQUIFILM TEARS) 1.4 % ophthalmic solution 1 drop (not administered)  polyethylene glycol (MIRALAX / GLYCOLAX) packet 17 g (not administered)  traMADol (ULTRAM) tablet 50 mg (not administered)  Cholecalciferol 1,000 Units (not administered)  vitamin B-12 (CYANOCOBALAMIN) tablet 1,000 mcg (not administered)  Menthol (Topical Analgesic) 4 % GEL 1 application (not administered)  levothyroxine (SYNTHROID, LEVOTHROID) tablet 25 mcg (not administered)  tiZANidine (ZANAFLEX) tablet 2 mg (not administered)  furosemide (LASIX) tablet 20 mg (not administered)  diltiazem (CARDIZEM CD) 24 hr capsule 180 mg (not administered)  apixaban (ELIQUIS) tablet 2.5  mg (not administered)  hydrALAZINE (APRESOLINE) tablet 10 mg (not administered)  0.9 %  sodium chloride infusion ( Intravenous New Bag/Given 05/13/17 0854)  levofloxacin (LEVAQUIN) IVPB 750 mg (750 mg Intravenous New  Bag/Given 05/13/17 0851)  dextromethorphan-guaiFENesin (MUCINEX DM) 30-600 MG per 12 hr tablet 1 tablet (not administered)  erythromycin ophthalmic ointment ( Both Eyes Given 05/13/17 0855)  ipratropium-albuterol (DUONEB) 0.5-2.5 (3) MG/3ML nebulizer solution 3 mL (3 mLs Nebulization Given 05/13/17 0733)  ciprofloxacin (CILOXAN) 0.3 % ophthalmic solution 2 drop (2 drops Both Eyes Given 05/13/17 0552)  potassium chloride SA (K-DUR,KLOR-CON) CR tablet 40 mEq (40 mEq Oral Given 05/13/17 0057)  levofloxacin (LEVAQUIN) IVPB 750 mg (0 mg Intravenous Stopped 05/13/17 0220)  labetalol (NORMODYNE,TRANDATE) injection 10 mg (10 mg Intravenous Given 05/13/17 0202)  potassium chloride SA (K-DUR,KLOR-CON) CR tablet 40 mEq (40 mEq Oral Given 05/13/17 0214)  magnesium oxide (MAG-OX) tablet 400 mg (400 mg Oral Given 05/13/17 0203)    Mobility walks with device

## 2017-05-13 NOTE — ED Notes (Signed)
APPLIED WARM COMPRESS TO EACH EYE. PT TOLERATED

## 2017-05-13 NOTE — Progress Notes (Addendum)
[facility] PROGRESS NOTE    Progress Note  Jasmin Lee  GYI:[id number] DOB: 10-May-1923 DOA: 05/12/2017 PCP: [name], MD     Brief Narrative:   Jasmin Lee is an 83 y.o. female past medical history of chronic atrial fibrillation on Eliquis, COPD comes into the hospital complaining of shortness of breath that started 3 days prior to admission with a productive cough, and x-ray was done in the ED that showed possible pneumonia with a leukocytosis of 16  Assessment/Plan:   Acute on chronic respiratory failure with hypoxia (HCC)/COPD. CAP (community acquired pneumonia) Started empirically on IV Levaquin, continue inhalers and supplemental oxygen. Resp panel is pending. The nurse noticed choking on her food we will check a swallowing evaluation.  Levaquin should cover for aspiration pneumonia.  Acute bacterial conjunctivitis with excessive purulent discharge: Started on Cipro drops in the ED PT was consulted.  HypoKalemia: Replete electrolytes orally.  Chronic atrial fibrillation: Rate controlled continue Eliquis.  No signs of overt bleeding.  Essential hypertension: She was given IV labetalol in the ED, will resume current home meds.  We will continue labetalol IV as needed.  Protein-calorie malnutrition, moderate (HCC) True 3 times daily.    DVT prophylaxis: eluquis Family Communication:none Disposition Plan/Barrier to D/C: unable to determine Code Status:     Code Status Orders  (From admission, onward)        Start     Ordered   05/13/17 0621  Do not attempt resuscitation (DNR)  Continuous    Question Answer Comment  In the event of cardiac or respiratory ARREST Do not call a "code blue"   In the event of cardiac or respiratory ARREST Do not perform Intubation, CPR, defibrillation or ACLS   In the event of cardiac or respiratory ARREST Use medication by any route, position, wound care, and other measures to relive pain and suffering. May  use oxygen, suction and manual treatment of airway obstruction as needed for comfort.      05/13/17 0620    Code Status History    Date Active Date Inactive Code Status Order ID Comments User Context   11/24/2016 18:24 11/27/2016 15:51 DNR [id number]  Louellen Molder, MD Inpatient   10/05/2016 16:18 10/08/2016 20:51 Full Code [id number]  [name], PA-C ED   05/11/2014 15:22 05/12/2014 20:48 Full Code [id number]  [name], MD Inpatient   06/12/2013 02:28 06/13/2013 18:46 Partial Code [id number]  [name], PA-C Inpatient   06/11/2013 22:03 06/12/2013 02:28 Full Code [id number]  [name], MD Inpatient        IV Access:    Peripheral IV   Procedures and diagnostic studies:   Dg Chest 2 View  Result Date: 05/13/2017 CLINICAL DATA:  Cough and shortness of breath EXAM: CHEST  2 VIEW COMPARISON:  Chest radiograph 05/11/2014 FINDINGS: There is shallow lung inflation with small bilateral pleural effusions and associated atelectasis. Small area of consolidation in the left lower lobe. Calcific aortic atherosclerosis. IMPRESSION: Small bilateral pleural effusions with associated atelectasis and possible left lower lobe consolidation. The Aortic Atherosclerosis (ICD10-I70.0). Electronically Signed   By: Ulyses Jarred M.D.   On: 05/13/2017 00:28     Medical Consultants:    None.  Anti-Infectives:   IV Levaquin  Subjective:    Jasmin Lee she relates her breathing is about the same, she would like to call her caregiver, she relates poor appetite.  Objective:    Vitals:   05/13/17 0615 05/13/17 0630 05/13/17  0645 05/13/17 0733  BP: 136/68 139/74 (!) 141/72   Pulse: 85 95 88   Resp: 15 18 20    Temp:      TempSrc:      SpO2: 92% 93% 93% 98%  Weight:      Height:       No intake or output data in the 24 hours ending 05/13/17 0916 Filed Weights   05/12/17 2315  Weight: 49.9 kg (110 lb)    Exam: General exam: In no acute distress. Respiratory  system: Good air movement appreciated wheezing. Cardiovascular system: S1 & S2 heard, RRR.  Gastrointestinal system: Abdomen is nondistended, soft and nontender.  Central nervous system: Alert and oriented. No focal neurological deficits. Extremities: No pedal edema. Skin: No rashes, lesions or ulcers Psychiatry: Judgement and insight appear normal. Mood & affect appropriate.    Data Reviewed:    Labs: Basic Metabolic Panel: Recent Labs  Lab 05/12/17 2327  NA 130*  K 3.1*  CL 93*  CO2 27  GLUCOSE 114*  BUN 15  CREATININE 0.74  CALCIUM 8.1*   GFR Estimated Creatinine Clearance: 31.5 mL/min (by C-G formula based on SCr of 0.74 mg/dL). Liver Function Tests: No results for input(s): AST, ALT, ALKPHOS, BILITOT, PROT, ALBUMIN in the last 168 hours. No results for input(s): LIPASE, AMYLASE in the last 168 hours. No results for input(s): AMMONIA in the last 168 hours. Coagulation profile No results for input(s): INR, PROTIME in the last 168 hours.  CBC: Recent Labs  Lab 05/12/17 2327  WBC 16.2*  NEUTROABS 13.9*  HGB 11.1*  HCT 32.0*  MCV 82.5  PLT 301   Cardiac Enzymes: No results for input(s): CKTOTAL, CKMB, CKMBINDEX, TROPONINI in the last 168 hours. BNP (last 3 results) No results for input(s): PROBNP in the last 8760 hours. CBG: No results for input(s): GLUCAP in the last 168 hours. D-Dimer: No results for input(s): DDIMER in the last 72 hours. Hgb A1c: No results for input(s): HGBA1C in the last 72 hours. Lipid Profile: No results for input(s): CHOL, HDL, LDLCALC, TRIG, CHOLHDL, LDLDIRECT in the last 72 hours. Thyroid function studies: No results for input(s): TSH, T4TOTAL, T3FREE, THYROIDAB in the last 72 hours.  Invalid input(s): FREET3 Anemia work up: No results for input(s): VITAMINB12, FOLATE, FERRITIN, TIBC, IRON, RETICCTPCT in the last 72 hours. Sepsis Labs: Recent Labs  Lab 05/12/17 2327 05/12/17 2355  WBC 16.2*  --   LATICACIDVEN  --  0.98     Microbiology No results found for this or any previous visit (from the past 240 hour(s)).   Medications:   . acetaminophen  1,000 mg Oral TID  . apixaban  2.5 mg Oral BID  . atorvastatin  10 mg Oral QODAY  . Cholecalciferol  1,000 Units Oral Daily  . diltiazem  180 mg Oral Daily  . erythromycin   Both Eyes Q6H  . furosemide  20 mg Oral Daily  . hydrALAZINE  10 mg Oral Q8H  . ipratropium-albuterol  3 mL Nebulization TID  . levothyroxine  25 mcg Oral QAC breakfast  . Menthol (Topical Analgesic)  1 application Apply externally TID  . montelukast  10 mg Oral QHS  . multivitamin with minerals  1 tablet Oral Q breakfast  . pantoprazole  40 mg Oral Daily  . polyvinyl alcohol  1 drop Both Eyes TID  . tiZANidine  2 mg Oral BID  . cyanocobalamin  1,000 mcg Oral Q breakfast  . vitamin C  250 mg Oral Daily  Continuous Infusions: . sodium chloride 75 mL/hr at 05/13/17 0854  . levofloxacin (LEVAQUIN) IV 750 mg (05/13/17 0851)      LOS: 0 days   Charlynne Cousins  [facility] Pager [phone]  *Please refer to [email or web address], password [email or web address] to get updated schedule on who will round on this patient, as hospitalists switch teams weekly. If 7PM-7AM, please contact night-coverage at [email or web address], password [email or web address] for any overnight needs.  05/13/2017, 9:16 AM

## 2017-05-13 NOTE — ED Notes (Signed)
CAREGIVER NOR PT NOT ABLE TO VERIFY MEDICATIONS PT IS CURRENTLY TAKING. CAREGIVER WILL ATTEMPT TO HAVE SOMEONE CONTACT HOSPITAL AND BRING THEM BY TO ASSIST WITH VERIFICATION OF HOME MEDICATIONS

## 2017-05-13 NOTE — Progress Notes (Signed)
PT Cancellation Note  Patient Details Name: Jasmin Lee MRN: [id number] DOB: [birth year]   Cancelled Treatment:    Reason Eval/Treat Not Completed: Other (comment). Patient being admitted with severe conjunctivitis. Will check back when transferred to the unit.   Claretha Cooper 05/13/2017, 12:50 PM Tresa Endo PT [phone]

## 2017-05-13 NOTE — ED Notes (Addendum)
CARE ASSISTANT AT BEDSIDE. UPDATED ON PT CURRENT STATUS. CAREGIVER ASSISTING WITH WARM COMPRESS TO EYES

## 2017-05-13 NOTE — H&P (Signed)
History and Physical    Jasmin Lee DOB: 1922/05/31 DOA: 05/12/2017  PCP: [name], MD  Patient coming from: Home  Chief Complaint: Shortness of breath  HPI: Jasmin Lee is a 83 y.o. female with medical history significant of atrial fibrillation on Eliquis, COPD, hyperlipidemia, hypothyroidism, asthma came to the hospital with complains of shortness of breath.  Patient states about 3 days ago she started noticing she was having a nonproductive cough but then the following day became productive was bringing up yellowish sputum.  She also reported of some subjective fevers and chills.  Then 2 days ago she started noticing increase in watering of her eyes and yesterday had purulent discharge bilaterally and was unable to open her eyes and see much.  At first her caregiver thought this was likely some sort of allergy but as it progressed it was advised that she come to the ER for evaluation.  In the ER she was noted to be mildly short of breath and her chest x-ray showed possible pneumonia.  Her labs showed mild leukocytosis of 16.2, sodium 130, potassium 3.1.  On physical exam she was noted to have bilateral purulent discharge from her eyes.  She was started on IV Rocephin and given Cipro drops.  At this time is unsafe for patient to go back home given she is unable to open her eyes due to bilateral bacterial conjunctivitis and also some exertional shortness of breath without hypoxia.  She is to be admitted for further care.    Review of Systems: As per HPI otherwise 10 point review of systems negative.    Past Medical History:  Diagnosis Date  . Anemia   . Aortic atherosclerosis ([place]) 2015   CTA Chest: Diffuse atherosclerosis of the aorta, great vessels and coronary arteries  . Arthritis    "in my legs and feet" (05/11/2014)  . Cardiomyopathy ([place])   . Chronic bronchitis ([place]) "2-3 times/yr"  . Complication of anesthesia    "started flinching at start of  hysterectomy; they had to give me more anesthetic"  . COPD with asthma ([place])   . Dyslipidemia   . Echocardiogram abnormal 12/05/2010   Moderate concentric left ventricular hypertrophy EF > 73% stage I diastolic dysfunction, left atrium mild to moderate dilatation, moderate mitral annular calcification with trace MR. Trace TR.  Marland Kitchen HTN (hypertension)   . Hyponatremia 11/2016  . Pneumonia "several times"  . Squamous carcinoma    "left face and thigh"  . Uterine cancer [facility])     Past Surgical History:  Procedure Laterality Date  . APPENDECTOMY  1954  . BREAST BIOPSY Left    "it was nothing"  . CARDIAC EVENT MONITOR  12/05/10-01/02/11   SINUS RHYTHM  . CATARACT EXTRACTION W/ INTRAOCULAR LENS  IMPLANT, BILATERAL Bilateral   . CHOLECYSTECTOMY  2003  . DILATION AND CURETTAGE OF UTERUS  1966  . SQUAMOUS CELL CARCINOMA EXCISION Left    face and thigh  . TRANSTHORACIC ECHOCARDIOGRAM  04/2014    Normal LV size and function Hyperdynamic left ventricle.Moderate concentric LVH with impaired relaxation.  Mild aortic stenosis  . VAGINAL HYSTERECTOMY  1966     reports that  has never smoked. she has never used smokeless tobacco. She reports that she does not drink alcohol or use drugs.  Allergies  Allergen Reactions  . Almond (Diagnostic) Shortness Of Breath  . Asa [Aspirin] Shortness Of Breath and Other (See Comments)    On high doses of ASA  . Coconut  Flavor Shortness Of Breath  . Neomycin Shortness Of Breath  . Peanut-Containing Drug Products Shortness Of Breath  . Penicillins Shortness Of Breath and Other (See Comments)    Has patient had a PCN reaction causing immediate rash, facial/tongue/throat swelling, SOB or lightheadedness with hypotension: Yes Has patient had a PCN reaction causing severe rash involving mucus membranes or skin necrosis: No Has patient had a PCN reaction that required hospitalization No Has patient had a PCN reaction occurring within the last 10 years: No If all  of the above answers are "NO", then may proceed with Cephalosporin use.   . Sulfa Antibiotics Shortness Of Breath  . Tizanidine Shortness Of Breath  . Lisinopril Cough  . Keflex [Cephalexin] Rash    Family History  Problem Relation Age of Onset  . Heart attack Mother   . Cancer Father   . Heart attack Maternal Grandmother   . Heart disease Son   . Asthma Daughter      Prior to Admission medications   Medication Sig Start Date End Date Taking? Authorizing Provider  acetaminophen (TYLENOL) 500 MG tablet Take 1,000 mg by mouth 3 (three) times daily.     [provider]  albuterol (PROVENTIL HFA;VENTOLIN HFA) 108 (90 Base) MCG/ACT inhaler Inhale 2 puffs into the lungs every 6 (six) hours as needed for wheezing or shortness of breath.    [provider]  albuterol (PROVENTIL) (2.5 MG/3ML) 0.083% nebulizer solution Take 2.5 mg by nebulization every 8 (eight) hours as needed for wheezing or shortness of breath.     [provider]  apixaban (ELIQUIS) 2.5 MG TABS tablet Take 1 tablet (2.5 mg total) by mouth 2 (two) times daily. 02/18/17   [name], MD  apixaban (ELIQUIS) 2.5 MG TABS tablet Take 1 tablet (2.5 mg total) by mouth 2 (two) times daily. 02/18/17   [name], MD  atorvastatin (LIPITOR) 10 MG tablet Take 10 mg by mouth every other day.  01/30/13   [provider]  CALCIUM-MAGNESIUM-ZINC PO Take 1 tablet by mouth daily.    [provider]  Cholecalciferol 1000 units tablet Take 1,000 Units by mouth daily.    [provider]  cyanocobalamin 1000 MCG tablet Take 1,000 mcg by mouth daily with breakfast.    [provider]  diltiazem (CARDIZEM CD) 180 MG 24 hr capsule Take 1 capsule (180 mg total) by mouth daily. 02/18/17   [name], MD  furosemide (LASIX) 20 MG tablet Take 20 mg by mouth. Monday, Wednesday, Friday, and Saturday.    [provider]  hydrALAZINE (APRESOLINE) 10 MG tablet TAKE 5 TABLETS  DAILY (2 TABLETS IN THE MORNING, 1 TABLET IN THE AFTERNOON AND 2 TABLETS IN THE EVENING) 02/01/17   [name], MD  levothyroxine (SYNTHROID, LEVOTHROID) 25 MCG tablet Take 25 mcg by mouth daily before breakfast.    [provider]  Menthol, Topical Analgesic, (BIOFREEZE) 4 % GEL Apply 1 application topically 3 (three) times daily.    [provider]  montelukast (SINGULAIR) 10 MG tablet Take 10 mg by mouth at bedtime.  12/03/12   [provider]  Multiple Vitamin (MULTIVITAMIN WITH MINERALS) TABS tablet Take 1 tablet by mouth daily with breakfast.     [provider]  Multiple Vitamins-Minerals (OCUVITE-LUTEIN PO) Take 1 tablet by mouth daily.    [provider]  pantoprazole (PROTONIX) 40 MG tablet Take 1 tablet (40 mg total) by mouth daily. 06/13/13   [name],  MD  phenazopyridine (PYRIDIUM) 200 MG tablet Take 200 mg by mouth as needed for pain.    [provider]  polyethylene glycol (MIRALAX / GLYCOLAX) packet Take 17 g by mouth daily as needed for mild constipation. 10/08/16   [name], MD  polyvinyl alcohol (ARTIFICIAL TEARS) 1.4 % ophthalmic solution Place 1 drop into both eyes 3 (three) times daily.    [provider]  tiZANidine (ZANAFLEX) 2 MG tablet Take 2 mg by mouth 2 (two) times daily.    [provider]  traMADol (ULTRAM) 50 MG tablet Take 1 tablet (50 mg total) by mouth every 6 (six) hours as needed for moderate pain. 10/08/16   [name], MD  vitamin C (ASCORBIC ACID) 250 MG tablet Take 250 mg by mouth daily.    [provider]    Physical Exam: Vitals:   05/13/17 0027 05/13/17 0030 05/13/17 0045 05/13/17 0100  BP:  (!) 152/92 (!) 134/95 (!) 169/97  Pulse:  (!) 107 (!) 111 (!) 107  Resp:  13 (!) 24 (!) 24  Temp:      TempSrc:      SpO2: 94% 100% 96% 95%  Weight:      Height:          Constitutional: NAD, calm, comfortable, elderly frail-appearing Vitals:   05/13/17 0027  05/13/17 0030 05/13/17 0045 05/13/17 0100  BP:  (!) 152/92 (!) 134/95 (!) 169/97  Pulse:  (!) 107 (!) 111 (!) 107  Resp:  13 (!) 24 (!) 24  Temp:      TempSrc:      SpO2: 94% 100% 96% 95%  Weight:      Height:       Eyes: Difficult to examine her eye as there currently shut with bilateral purulent discharge and erythema noted ENMT: Mucous membranes are moist. Posterior pharynx clear of any exudate or lesions.Normal dentition.  Neck: normal, supple, no masses, no thyromegaly Respiratory: Diffuse mild coarse breath sounds without any wheezing Cardiovascular: Regular rate and rhythm, no murmurs / rubs / gallops. No extremity edema. 2+ pedal pulses. No carotid bruits.  Abdomen: no tenderness, no masses palpated. No hepatosplenomegaly. Bowel sounds positive.  Musculoskeletal: no clubbing / cyanosis. No joint deformity upper and lower extremities. Good ROM, no contractures. Normal muscle tone.  Skin: no rashes, lesions, ulcers. No induration Neurologic: CN 2-12 grossly intact. Sensation intact, DTR normal. Strength 5/5 in all 4.  Psychiatric: Normal judgment and insight. Alert and oriented x 3. Normal mood.   Labs on Admission: I have personally reviewed following labs and imaging studies  CBC: Recent Labs  Lab 05/12/17 2327  WBC 16.2*  NEUTROABS 13.9*  HGB 11.1*  HCT 32.0*  MCV 82.5  PLT 527   Basic Metabolic Panel: Recent Labs  Lab 05/12/17 2327  NA 130*  K 3.1*  CL 93*  CO2 27  GLUCOSE 114*  BUN 15  CREATININE 0.74  CALCIUM 8.1*   GFR: Estimated Creatinine Clearance: 31.5 mL/min (by C-G formula based on SCr of 0.74 mg/dL). Liver Function Tests: No results for input(s): AST, ALT, ALKPHOS, BILITOT, PROT, ALBUMIN in the last 168 hours. No results for input(s): LIPASE, AMYLASE in the last 168 hours. No results for input(s): AMMONIA in the last 168 hours. Coagulation Profile: No results for input(s): INR, PROTIME in the last 168 hours. Cardiac Enzymes: No results for  input(s): CKTOTAL, CKMB, CKMBINDEX, TROPONINI in the last 168 hours. BNP (last 3 results) No results for input(s): PROBNP in the  last 8760 hours. HbA1C: No results for input(s): HGBA1C in the last 72 hours. CBG: No results for input(s): GLUCAP in the last 168 hours. Lipid Profile: No results for input(s): CHOL, HDL, LDLCALC, TRIG, CHOLHDL, LDLDIRECT in the last 72 hours. Thyroid Function Tests: No results for input(s): TSH, T4TOTAL, FREET4, T3FREE, THYROIDAB in the last 72 hours. Anemia Panel: No results for input(s): VITAMINB12, FOLATE, FERRITIN, TIBC, IRON, RETICCTPCT in the last 72 hours. Urine analysis:    Component Value Date/Time   COLORURINE YELLOW 11/24/2016 1804   APPEARANCEUR CLEAR 11/24/2016 1804   LABSPEC <1.005 (L) 11/24/2016 1804   PHURINE 7.0 11/24/2016 1804   GLUCOSEU NEGATIVE 11/24/2016 1804   HGBUR SMALL (A) 11/24/2016 1804   BILIRUBINUR NEGATIVE 11/24/2016 1804   KETONESUR NEGATIVE 11/24/2016 1804   PROTEINUR 30 (A) 11/24/2016 1804   UROBILINOGEN 0.2 06/11/2013 1951   NITRITE NEGATIVE 11/24/2016 1804   LEUKOCYTESUR TRACE (A) 11/24/2016 1804   Sepsis Labs: !!!!!!!!!!!!!!!!!!!!!!!!!!!!!!!!!!!!!!!!!!!! @LABRCNTIP (procalcitonin:4,lacticidven:4) )No results found for this or any previous visit (from the past 240 hour(s)).   Radiological Exams on Admission: Dg Chest 2 View  Result Date: 05/13/2017 CLINICAL DATA:  Cough and shortness of breath EXAM: CHEST  2 VIEW COMPARISON:  Chest radiograph 05/11/2014 FINDINGS: There is shallow lung inflation with small bilateral pleural effusions and associated atelectasis. Small area of consolidation in the left lower lobe. Calcific aortic atherosclerosis. IMPRESSION: Small bilateral pleural effusions with associated atelectasis and possible left lower lobe consolidation. The Aortic Atherosclerosis (ICD10-I70.0). Electronically Signed   By: Ulyses Jarred M.D.   On: 05/13/2017 00:28    EKG: Independently reviewed.    Assessment/Plan Active Problems:   CAP (community acquired pneumonia)   Acute respiratory distress Community-acquired pneumonia Mild acute exacerbation of mild persistent COPD - Admit the patient to the hospital for further care -Sputum cultures have been ordered, chest x-ray shows concerns for pneumonia - We will treat with IV Levaquin, patient is allergic to penicillin -Nebulizer treatments every 6 hours, supplemental oxygen if needed -Mucinex if necessary -Provide supportive care, check respiratory panel  Acute bacterial conjunctivitis with excessive bilateral purulent discharge - Presentation appears to be likely bacterial, Cipro eyedrops ordered in the ER.  Will continue erythromycin eye ointment.  Supportive care -Leave the patient on complete bedrest at this time. -Caution she lives alone at home and only gets home health 2 times a week.  She is unable to open her eyes to see much safely at this time  Hypokalemia -Replete electrolytes  Atrial fibrillation - Continue home Cardizem and Eliquis.  Follows with outpatient cardiologist  GERD -Continue home PPI  Hypothyroidism -Continue Synthroid  Essential hypertension, elevated blood pressure -Her systolic blood pressure is almost in 170s with heart rate of 105.  Advised nurse to give 10 mg of IV labetalol if this remains elevated after the blood pressure cycles again. -Continue home hydralazine, Lasix, Cardizem  Hyponatremia, stable -This is chronic. Will monitor   Protein calorie malnutrition, moderate -Nutrition consult and encourage oral diet  DVT prophylaxis: On Eliquis Code Status: DNR Family Communication: None at bedside Disposition Plan: TBD Consults called: None Admission status: Inpatient tele    Ankit Arsenio Loader MD [facility]   If 7PM-7AM, please contact night-coverage [email or web address] Password [email or web address]  05/13/2017, 1:15 AM

## 2017-05-13 NOTE — Evaluation (Signed)
Clinical/Bedside Swallow Evaluation Patient Details  Name: Jasmin Lee MRN: [id number] Date of Birth: 16-Jul-1922  Today's Date: 05/13/2017 Time: SLP Start Time (ACUTE ONLY): 6387 SLP Stop Time (ACUTE ONLY): 1614 SLP Time Calculation (min) (ACUTE ONLY): 17 min  Past Medical History:  Past Medical History:  Diagnosis Date  . Anemia   . Aortic atherosclerosis ([place]) 2015   CTA Chest: Diffuse atherosclerosis of the aorta, great vessels and coronary arteries  . Arthritis    "in my legs and feet" (05/11/2014)  . Cardiomyopathy ([place])   . Chronic bronchitis ([place]) "2-3 times/yr"  . Complication of anesthesia    "started flinching at start of hysterectomy; they had to give me more anesthetic"  . COPD with asthma ([place])   . Dyslipidemia   . Echocardiogram abnormal 12/05/2010   Moderate concentric left ventricular hypertrophy EF > 56% stage I diastolic dysfunction, left atrium mild to moderate dilatation, moderate mitral annular calcification with trace MR. Trace TR.  Marland Kitchen HTN (hypertension)   . Hyponatremia 11/2016  . Pneumonia "several times"  . Squamous carcinoma    "left face and thigh"  . Uterine cancer [facility])    Past Surgical History:  Past Surgical History:  Procedure Laterality Date  . APPENDECTOMY  1954  . BREAST BIOPSY Left    "it was nothing"  . CARDIAC EVENT MONITOR  12/05/10-01/02/11   SINUS RHYTHM  . CATARACT EXTRACTION W/ INTRAOCULAR LENS  IMPLANT, BILATERAL Bilateral   . CHOLECYSTECTOMY  2003  . DILATION AND CURETTAGE OF UTERUS  1966  . SQUAMOUS CELL CARCINOMA EXCISION Left    face and thigh  . TRANSTHORACIC ECHOCARDIOGRAM  04/2014    Normal LV size and function Hyperdynamic left ventricle.Moderate concentric LVH with impaired relaxation.  Mild aortic stenosis  . VAGINAL HYSTERECTOMY  1966   HPI:  Jasmin Lee a 83 y.o.femalewith medical history significant for atrial fibrillation, COPD, hyperlipidemia, hypothyroidism, pna, asthma came to the hospital with  complains of shortness of breath and because "I couldn't see" (per pt- have conjunctivitis?). Chest x-ray 12/20 Small bilateral pleural effusions with associated atelectasis and possible left lower lobe consolidation. Affirms coughing during meals "sometimes".   Assessment / Plan / Recommendation Clinical Impression  Suspected airway compromise with thin liquid as indicated by immediate cough;  audible, questionable discoordinated swallow function. Eructation with possible esophageal involvements. SLP will modify diet to Dys 3, nectar thick liquids until MBS next date.      SLP Visit Diagnosis: Dysphagia, unspecified (R13.10)    Aspiration Risk  Moderate aspiration risk    Diet Recommendation Dysphagia 3 (Mech soft);Nectar-thick liquid   Liquid Administration via: Cup;No straw Medication Administration: Crushed with puree Supervision: Patient able to self feed;Intermittent supervision to cue for compensatory strategies Compensations: Slow rate;Small sips/bites    Other  Recommendations Oral Care Recommendations: Oral care BID   Follow up Recommendations Other (comment)(TBD)      Frequency and Duration            Prognosis        Swallow Study   General HPI: Jasmin Lee a 83 y.o.femalewith medical history significant for atrial fibrillation, COPD, hyperlipidemia, hypothyroidism, pna, asthma came to the hospital with complains of shortness of breath and because "I couldn't see" (per pt- have conjunctivitis?). Chest x-ray 12/20 Small bilateral pleural effusions with associated atelectasis and possible left lower lobe consolidation. Affirms coughing during meals "sometimes". Type of Study: Bedside Swallow Evaluation Previous Swallow Assessment: (none) Diet Prior to this Study: Regular;Thin liquids  Temperature Spikes Noted: No Respiratory Status: Room air History of Recent Intubation: No Behavior/Cognition: Alert;Cooperative;Pleasant mood Oral Cavity Assessment: Within  Functional Limits Oral Care Completed by SLP: No Oral Cavity - Dentition: Adequate natural dentition Vision: Functional for self-feeding Self-Feeding Abilities: Able to feed self Patient Positioning: Upright in bed Baseline Vocal Quality: Normal Volitional Cough: Strong Volitional Swallow: Able to elicit    Oral/Motor/Sensory Function Overall Oral Motor/Sensory Function: Within functional limits   Ice Chips Ice chips: Not tested   Thin Liquid Thin Liquid: Impaired Presentation: Cup Oral Phase Impairments: (none) Oral Phase Functional Implications: (none) Pharyngeal  Phase Impairments: Cough - Immediate(audible swallow)    Nectar Thick Nectar Thick Liquid: Not tested   Honey Thick Honey Thick Liquid: Not tested   Puree Puree: Within functional limits   Solid   GO   Solid: Within functional limits        Houston Siren 05/13/2017,4:45 PM  Orbie Pyo [place].Ed [employer] [phone]

## 2017-05-13 NOTE — ED Notes (Signed)
EYE TISSUE MUCH IMPROVED. DISCHARGE LESS. PT CONTINUES TO TOLERATE.

## 2017-05-13 NOTE — ED Notes (Signed)
ADMISSION Provider at bedside. 

## 2017-05-14 ENCOUNTER — Inpatient Hospital Stay (HOSPITAL_COMMUNITY): Payer: Medicare HMO

## 2017-05-14 LAB — STREP PNEUMONIAE URINARY ANTIGEN: STREP PNEUMO URINARY ANTIGEN: NEGATIVE

## 2017-05-14 LAB — HIV ANTIBODY (ROUTINE TESTING W REFLEX): HIV SCREEN 4TH GENERATION: NONREACTIVE

## 2017-05-14 MED ORDER — POTASSIUM CHLORIDE CRYS ER 20 MEQ PO TBCR
40.0000 meq | EXTENDED_RELEASE_TABLET | Freq: Two times a day (BID) | ORAL | Status: AC
  Administered 2017-05-14: 40 meq via ORAL
  Filled 2017-05-14: qty 2

## 2017-05-14 MED ORDER — MOMETASONE FURO-FORMOTEROL FUM 200-5 MCG/ACT IN AERO
2.0000 | INHALATION_SPRAY | Freq: Two times a day (BID) | RESPIRATORY_TRACT | Status: DC
  Administered 2017-05-14 – 2017-05-20 (×13): 2 via RESPIRATORY_TRACT
  Filled 2017-05-14: qty 8.8

## 2017-05-14 MED ORDER — METHYLPREDNISOLONE SODIUM SUCC 40 MG IJ SOLR
40.0000 mg | Freq: Two times a day (BID) | INTRAMUSCULAR | Status: DC
  Administered 2017-05-14: 40 mg via INTRAVENOUS
  Filled 2017-05-14: qty 1

## 2017-05-14 NOTE — Progress Notes (Signed)
CRITICAL VALUE ALERT  Critical Value:  Eye culture positive for HAEMOPHILUS INFLUENZAE   Date & Time Notied:  05/14/17 1335  Provider Notified: Aileen Fass  Orders Received/Actions taken:

## 2017-05-14 NOTE — Progress Notes (Signed)
PT Cancellation Note  Patient Details Name: BRIYONNA OMARA MRN: [id number] DOB: 06-Oct-1922   Cancelled Treatment:    Reason Eval/Treat Not Completed: Patient declined, patient stated that she is not up to ambulating this PM with much encouragement.   Claretha Cooper 05/14/2017, 4:12 PM  Tresa Endo PT [phone]

## 2017-05-14 NOTE — Care Management Note (Signed)
Case Management Note  Patient Details  Name: Jasmin Lee MRN: [id number] Date of Birth: Nov 15, 1922  Subjective/Objective:                  pneumonia  Action/Plan: Date: May 14, 2017 Velva Harman, BSN, [place], [place] Chart and notes review for patient progress and needs. Will follow for case management and discharge needs. Next review date: [id number]  Expected Discharge Date:  (unknown)               Expected Discharge Plan:  Home/Self Care  In-House Referral:     Discharge planning Services  CM Consult  Post Acute Care Choice:    Choice offered to:     DME Arranged:    DME Agency:     HH Arranged:    HH Agency:     Status of Service:  In process, will continue to follow  If discussed at Long Length of Stay Meetings, dates discussed:    Additional Comments:  Leeroy Cha, RN 05/14/2017, 8:35 AM

## 2017-05-14 NOTE — Progress Notes (Addendum)
[facility] PROGRESS NOTE    Progress Note  JORITA BOHANON  TZG:[id number] DOB: 1922/12/11 DOA: 05/12/2017 PCP: [name], MD     Brief Narrative:   Jasmin Lee is an 83 y.o. female past medical history of chronic atrial fibrillation on Eliquis, COPD comes into the hospital complaining of shortness of breath that started 3 days prior to admission with a productive cough, and x-ray was done in the ED that showed possible pneumonia with a leukocytosis of 16  Assessment/Plan:   Acute on chronic respiratory failure with hypoxia (HCC)/COPD. CAP (community acquired pneumonia) Started empirically on IV Levaquin, continue inhalers and supplemental oxygen. HIV was negative strep pneumo antigen is negative. Speech therapy was consulted recommended at this stage III diet due to risk of aspiration. Consult PT OT. Started on low-dose steroids as she is wheezing today. She also has mild JVD and, we will KVO IV fluids resume her home dose of Lasix.  Restrict her fluids to 1200 cc replete potassium orally recheck a basic metabolic panel in the morning. Leukocytosis is probably due to infectious etiology. Her leukocytosis will probably increase as we are starting steroids today due to her wheezing.  Acute bacterial conjunctivitis with excessive purulent discharge: Started on Cipro drops in the ED PT was consulted.  HypoKalemia: Replete electrolytes orally. Replete orally as restarting Lasix, recheck a basic metabolic panel in the morning.  Chronic atrial fibrillation: Rate controlled continue Eliquis.  No signs of overt bleeding.  Essential hypertension: She was given IV labetalol in the ED, will resume current home meds.  We will continue labetalol IV as needed.  Protein-calorie malnutrition, moderate (HCC) True 3 times daily.  DVT prophylaxis: eluquis Family Communication:none Disposition Plan/Barrier to D/C: unable to determine Code Status:     Code Status  Orders  (From admission, onward)        Start     Ordered   05/13/17 0621  Do not attempt resuscitation (DNR)  Continuous    Question Answer Comment  In the event of cardiac or respiratory ARREST Do not call a "code blue"   In the event of cardiac or respiratory ARREST Do not perform Intubation, CPR, defibrillation or ACLS   In the event of cardiac or respiratory ARREST Use medication by any route, position, wound care, and other measures to relive pain and suffering. May use oxygen, suction and manual treatment of airway obstruction as needed for comfort.      05/13/17 0620    Code Status History    Date Active Date Inactive Code Status Order ID Comments User Context   11/24/2016 18:24 11/27/2016 15:51 DNR [id number]  Louellen Molder, MD Inpatient   10/05/2016 16:18 10/08/2016 20:51 Full Code [id number]  [name], PA-C ED   05/11/2014 15:22 05/12/2014 20:48 Full Code [id number]  [name], MD Inpatient   06/12/2013 02:28 06/13/2013 18:46 Partial Code [id number]  [name], PA-C Inpatient   06/11/2013 22:03 06/12/2013 02:28 Full Code [id number]  [name], MD Inpatient        IV Access:    Peripheral IV   Procedures and diagnostic studies:   Dg Chest 2 View  Result Date: 05/13/2017 CLINICAL DATA:  Cough and shortness of breath EXAM: CHEST  2 VIEW COMPARISON:  Chest radiograph 05/11/2014 FINDINGS: There is shallow lung inflation with small bilateral pleural effusions and associated atelectasis. Small area of consolidation in the left lower lobe. Calcific aortic atherosclerosis. IMPRESSION: Small bilateral pleural effusions with associated atelectasis and  possible left lower lobe consolidation. The Aortic Atherosclerosis (ICD10-I70.0). Electronically Signed   By: Ulyses Jarred M.D.   On: 05/13/2017 00:28     Medical Consultants:    None.  Anti-Infectives:   IV Levaquin  Subjective:    [name] she relates her breathing is about the  same.  Objective:    Vitals:   05/14/17 0600 05/14/17 0615 05/14/17 0627 05/14/17 0735  BP:      Pulse:  (!) 112 (!) 107   Resp: (!) 24 20    Temp:      TempSrc:      SpO2: (!) 80% 97% 92% 90%  Weight:      Height:       No intake or output data in the 24 hours ending 05/14/17 1028 Filed Weights   05/12/17 2315  Weight: 49.9 kg (110 lb)    Exam: General exam: In no acute distress. Respiratory system: Good air movement I do appreciated wheezing. Cardiovascular system: S1 & S2 heard, RRR.  Gastrointestinal system: Abdomen is nondistended, soft and nontender.  Central nervous system: Alert and oriented. No focal neurological deficits. Extremities: No pedal edema. Skin: No rashes, lesions or ulcers Psychiatry: Judgement and insight appear normal. Mood & affect appropriate.    Data Reviewed:    Labs: Basic Metabolic Panel: Recent Labs  Lab 05/12/17 2327 05/13/17 1220  NA 130* 129*  K 3.1* 3.7  CL 93* 94*  CO2 27 27  GLUCOSE 114* 119*  BUN 15 15  CREATININE 0.74 0.68  CALCIUM 8.1* 7.6*   GFR Estimated Creatinine Clearance: 31.5 mL/min (by C-G formula based on SCr of 0.68 mg/dL). Liver Function Tests: Recent Labs  Lab 05/13/17 1220  AST 36  ALT 24  ALKPHOS 99  BILITOT 0.9  PROT 4.6*  ALBUMIN 2.2*   No results for input(s): LIPASE, AMYLASE in the last 168 hours. No results for input(s): AMMONIA in the last 168 hours. Coagulation profile No results for input(s): INR, PROTIME in the last 168 hours.  CBC: Recent Labs  Lab 05/12/17 2327 05/13/17 1220  WBC 16.2* 19.9*  NEUTROABS 13.9* 18.0*  HGB 11.1* 10.9*  HCT 32.0* 32.1*  MCV 82.5 82.9  PLT 301 298   Cardiac Enzymes: No results for input(s): CKTOTAL, CKMB, CKMBINDEX, TROPONINI in the last 168 hours. BNP (last 3 results) No results for input(s): PROBNP in the last 8760 hours. CBG: No results for input(s): GLUCAP in the last 168 hours. D-Dimer: No results for input(s): DDIMER in the last 72  hours. Hgb A1c: No results for input(s): HGBA1C in the last 72 hours. Lipid Profile: No results for input(s): CHOL, HDL, LDLCALC, TRIG, CHOLHDL, LDLDIRECT in the last 72 hours. Thyroid function studies: No results for input(s): TSH, T4TOTAL, T3FREE, THYROIDAB in the last 72 hours.  Invalid input(s): FREET3 Anemia work up: No results for input(s): VITAMINB12, FOLATE, FERRITIN, TIBC, IRON, RETICCTPCT in the last 72 hours. Sepsis Labs: Recent Labs  Lab 05/12/17 2327 05/12/17 2355 05/13/17 1220  WBC 16.2*  --  19.9*  LATICACIDVEN  --  0.98  --    Microbiology Recent Results (from the past 240 hour(s))  Eye culture     Status: None (Preliminary result)   Collection Time: 05/13/17 12:11 AM  Result Value Ref Range Status   Specimen Description EYE EXUDATE  Final   Special Requests NONE  Final   Gram Stain   Final    NO WBC SEEN NO ORGANISMS SEEN Performed at [facility]ospital Lab, [place] [address], [place], [place] [address]    Culture PENDING  Incomplete   Report Status PENDING  Incomplete  Respiratory Panel by PCR     Status: None   Collection Time: 05/13/17  3:56 AM  Result Value Ref Range Status   Adenovirus NOT DETECTED NOT DETECTED Final   Coronavirus 229E NOT DETECTED NOT DETECTED Final   Coronavirus HKU1 NOT DETECTED NOT DETECTED Final   Coronavirus NL63 NOT DETECTED NOT DETECTED Final   Coronavirus OC43 NOT DETECTED NOT DETECTED Final   Metapneumovirus NOT DETECTED NOT DETECTED Final   Rhinovirus / Enterovirus NOT DETECTED NOT DETECTED Final   Influenza A NOT DETECTED NOT DETECTED Final   Influenza B NOT DETECTED NOT DETECTED Final   Parainfluenza Virus 1 NOT DETECTED NOT DETECTED Final   Parainfluenza Virus 2 NOT DETECTED NOT DETECTED Final   Parainfluenza Virus 3 NOT DETECTED NOT DETECTED Final   Parainfluenza Virus 4 NOT DETECTED NOT DETECTED Final   Respiratory Syncytial Virus NOT DETECTED NOT DETECTED Final   Bordetella pertussis NOT DETECTED NOT DETECTED Final    Chlamydophila pneumoniae NOT DETECTED NOT DETECTED Final   Mycoplasma pneumoniae NOT DETECTED NOT DETECTED Final     Medications:   . acetaminophen  1,000 mg Oral TID  . apixaban  2.5 mg Oral BID  . atorvastatin  10 mg Oral QODAY  . chlorhexidine  15 mL Mouth Rinse BID  . cholecalciferol  1,000 Units Oral Daily  . diltiazem  180 mg Oral Daily  . erythromycin   Both Eyes Q6H  . furosemide  20 mg Oral Daily  . hydrALAZINE  10 mg Oral Q8H  . ipratropium-albuterol  3 mL Nebulization TID  . levothyroxine  25 mcg Oral QAC breakfast  . mouth rinse  15 mL Mouth Rinse q12n4p  . montelukast  10 mg Oral QHS  . multivitamin with minerals  1 tablet Oral Q breakfast  . pantoprazole  40 mg Oral Daily  . polyvinyl alcohol  1 drop Both Eyes TID  . cyanocobalamin  1,000 mcg Oral Q breakfast   Continuous Infusions: . sodium chloride 75 mL/hr at 05/13/17 0854  . [START ON 05/15/2017] levofloxacin ([place]) IV        LOS: 1 day   Charlynne Cousins  [facility] Pager [phone]  *Please refer to [email or web address], password [email or web address] to get updated schedule on who will round on this patient, as hospitalists switch teams weekly. If 7PM-7AM, please contact night-coverage at [email or web address], password [email or web address] for any overnight needs.  05/14/2017, 10:28 AM

## 2017-05-14 NOTE — Progress Notes (Addendum)
Initial Nutrition Assessment  DOCUMENTATION CODES:   Not applicable  INTERVENTION:    Mighty Shake II Q24 with meals, each supplement provides 480-500 kcals and 20-23 grams of protein  Provide MVI daily  NUTRITION DIAGNOSIS:   Increased nutrient needs related to wound healing as evidenced by estimated needs.  GOAL:   Patient will meet greater than or equal to 90% of their needs  MONITOR:   PO intake, Supplement acceptance, Weight trends, Labs  REASON FOR ASSESSMENT:   Consult Assessment of nutrition requirement/status  ASSESSMENT:   Pt with PMH significant for COPD, HLD, hypothyroidism, cardiomyopathy, and uterine cancer. Presents this admission with HCAP and mild acute exacerbation of mild persistent COPD. Pt noted to have bacterial conjunctivitis with purulent discharge.    Spoke with pt at bedside. Pt denies any loss in appetite but reports she is not a "big eater." She eats three meals per day that contain a protein and a grain but eats a small portion. Pt uses Ensure at home but not consistently. Dicussed possibly increasing her Ensure to one per day for wound healing. Pt amendable to Mighty Shake this stay. SLP saw pt 12/20 and recommends DYS3 diet with nectar thick liquids due to moderate aspiration risk.  Weight noted to remain stable around 110-115 lb for the past year. Pt denies any recent weight loss. UBW is around 120 lb. Nutrition-Focused physical exam completed. Bilateral lower extremities show to have moderate swelling possibly masking muscle losses and weight loss data.   Medications reviewed and include: Vit D, lasix, MVI with minerals, Vit B12 Labs reviewed: Na 129 (L) Cl 94 (L)   NUTRITION - FOCUSED PHYSICAL EXAM:    Most Recent Value  Orbital Region  Moderate depletion  Upper Arm Region  Severe depletion  Thoracic and Lumbar Region  Unable to assess  Buccal Region  Moderate depletion  Temple Region  Moderate depletion  Clavicle Bone Region  Severe  depletion  Clavicle and Acromion Bone Region  Severe depletion  Scapular Bone Region  Unable to assess  Dorsal Hand  Moderate depletion  Patellar Region  Moderate depletion  Anterior Thigh Region  Moderate depletion  Posterior Calf Region  Moderate depletion  Edema (RD Assessment)  Moderate  Hair  Reviewed  Eyes  Reviewed [red, swollen]  Mouth  Reviewed  Skin  Reviewed  Nails  Reviewed       Diet Order:  DIET DYS 3 Room service appropriate? Yes; Fluid consistency: Nectar Thick  EDUCATION NEEDS:   Education needs have been addressed  Skin:  Skin Assessment: Skin Integrity Issues: Skin Integrity Issues:: Stage II Stage II: sacrum  Last BM:  05/13/17  Height:   Ht Readings from Last 1 Encounters:  05/12/17 4\' 10"  (1.473 m)    Weight:   Wt Readings from Last 1 Encounters:  05/12/17 110 lb (49.9 kg)    Ideal Body Weight:  43.2 kg  BMI:  Body mass index is 22.99 kg/m.  Estimated Nutritional Needs:   Kcal:  1300-1500 kcal/day  Protein:  60-70 g/day  Fluid:  >1.3 L/day    Mariana Single RD, LDN Clinical Nutrition Pager # - [phone]

## 2017-05-14 NOTE — Progress Notes (Addendum)
Patient has daughter & POA named Vaughan Basta [phone]. Info added to emergency contact. She lives in [place]. With permission from patient I verified her identity and shared information regarding patients condition.

## 2017-05-14 NOTE — Progress Notes (Signed)
Patient transferred from bed to [facility], heavy one assist. Patient very anxious about movement. Provided encouragement.

## 2017-05-14 NOTE — Progress Notes (Signed)
Modified Barium Swallow Progress Note  Patient Details  Name: Jasmin Lee MRN: [id number] Date of Birth: September 21, 1922  Today's Date: 05/14/2017  Modified Barium Swallow completed.  Full report located under Chart Review in the Imaging Section.  Brief recommendations include the following:  Clinical Impression  Pt presents with functional oropharyngeal swallow with age-related changes considered to be normal.  There is mildly discoordinated oral preparation of boluses; pharyngeal swallow triggers at level of pyriform sinuses; there is no residue remaining in pharynx post-swallow.  During initial swallow of thin liquids, there may have been an incident of trace aspiration, but the fluoroscopy was shut off prematurely - aspiration could not be verified. However, during all subsequent swallows, pt closed laryngeal vestibule reliably and neither penetrated nor aspirated any materials.  Recommend resuming a regular diet, thin liquids.  No further SLP needs are identified.    Swallow Evaluation Recommendations       SLP Diet Recommendations: Thin liquid;Regular solids   Liquid Administration via: Cup   Medication Administration: Whole meds with puree   Supervision: Patient able to self feed           Oral Care Recommendations: Oral care BID        Juan Quam Laurice 05/14/2017,2:31 PM

## 2017-05-14 NOTE — Evaluation (Signed)
Physical Therapy Evaluation Patient Details Name: Jasmin Lee MRN: [id number] DOB: Nov 18, 1922 Today's Date: 05/14/2017   History of Present Illness  Jasmin Lee is a 83 y.o. female with medical history significant for atrial fibrillation, COPD, hyperlipidemia, hypothyroidism, pna, asthma came to the hospital 05/12/17 with complains of shortness of breath and because "I couldn't see" (per pt- have conjunctivitis?). Chest x-ray 12/20 Small bilateral pleural effusions with associated atelectasis and possible left lower lobe consolidation  Clinical Impression  The patient was not amenable to ambulation this visit. She did sit at bed edge. Noted oxygen saturation dropped on RA while mobilizing- down to 87%. Replaced O2. Noted quite  Sob  With mobility. Will attempt ambulation  Later today if agreeable.    Follow Up Recommendations SNF(unless gets caregivers 24/7)    Equipment Recommendations  None recommended by PT    Recommendations for Other Services       Precautions / Restrictions Precautions Precautions: Fall Precaution Comments: conjunctivitis      Mobility  Bed Mobility Overal bed mobility: Needs Assistance Bed Mobility: Supine to Sit;Sit to Supine     Supine to sit: Mod assist Sit to supine: Mod assist   General bed mobility comments: assist with trunk and legs   Transfers                 General transfer comment: patient declined to attempt.  Ambulation/Gait                Stairs            Wheelchair Mobility    Modified Rankin (Stroke Patients Only)       Balance Overall balance assessment: Needs assistance Sitting-balance support: Bilateral upper extremity supported;Feet supported Sitting balance-Leahy Scale: Fair                                       Pertinent Vitals/Pain Pain Assessment: No/denies pain    Home Living Family/patient expects to be discharged to:: Private residence Living Arrangements:  Alone Available Help at Discharge: Family;Friend(s);Personal care attendant;Available PRN/intermittently Type of Home: House Home Access: Level entry     Home Layout: One level Home Equipment: Walker - 2 wheels;Tub bench;Hand held shower head;Walker - 4 wheels Additional Comments: pt reported that she has a personal care attendant that comes twice a week and does "whatever I need her to do". When specifically asked about assistance with ADLs, pt initially stating that her PCA assists her but then later stating she can get dressed and bathe herself independently    Prior Function Level of Independence: Independent with assistive device(s)         Comments: pt reported that she ambulates with RW     Hand Dominance        Extremity/Trunk Assessment   Upper Extremity Assessment Upper Extremity Assessment: Generalized weakness    Lower Extremity Assessment Lower Extremity Assessment: Generalized weakness    Cervical / Trunk Assessment Cervical / Trunk Assessment: Kyphotic  Communication   Communication: No difficulties  Cognition Arousal/Alertness: Awake/alert Behavior During Therapy: WFL for tasks assessed/performed Overall Cognitive Status: Within Functional Limits for tasks assessed                                        General Comments  Exercises     Assessment/Plan    PT Assessment Patient needs continued PT services  PT Problem List Decreased strength;Decreased range of motion;Decreased knowledge of use of DME;Decreased activity tolerance;Decreased safety awareness;Decreased knowledge of precautions;Decreased mobility;Cardiopulmonary status limiting activity       PT Treatment Interventions DME instruction;Gait training;Functional mobility training;Therapeutic activities;Patient/family education    PT Goals (Current goals can be found in the Care Plan section)  Acute Rehab PT Goals Patient Stated Goal: to go hjome PT Goal Formulation:  With patient Time For Goal Achievement: 05/28/17 Potential to Achieve Goals: Fair    Frequency Min 2X/week   Barriers to discharge        Co-evaluation               AM-PAC PT "6 Clicks" Daily Activity  Outcome Measure Difficulty turning over in bed (including adjusting bedclothes, sheets and blankets)?: Unable Difficulty moving from lying on back to sitting on the side of the bed? : Unable Difficulty sitting down on and standing up from a chair with arms (e.g., wheelchair, bedside commode, etc,.)?: Unable Help needed moving to and from a bed to chair (including a wheelchair)?: Total Help needed walking in hospital room?: Total Help needed climbing 3-5 steps with a railing? : Total 6 Click Score: 6    End of Session   Activity Tolerance: Patient limited by fatigue Patient left: in bed;with call bell/phone within reach;with bed alarm set Nurse Communication: Mobility status PT Visit Diagnosis: Difficulty in walking, not elsewhere classified (R26.2)    Time: 1044-1100 PT Time Calculation (min) (ACUTE ONLY): 16 min   Charges:   PT Evaluation $PT Eval Low Complexity: 1 Low     PT G CodesTresa Endo PT [phone]   Claretha Cooper 05/14/2017, 1:46 PM

## 2017-05-15 DIAGNOSIS — J9621 Acute and chronic respiratory failure with hypoxia: Secondary | ICD-10-CM

## 2017-05-15 LAB — BASIC METABOLIC PANEL
Anion gap: 8 (ref 5–15)
BUN: 17 mg/dL (ref 6–20)
CO2: 27 mmol/L (ref 22–32)
CREATININE: 0.7 mg/dL (ref 0.44–1.00)
Calcium: 8.2 mg/dL — ABNORMAL LOW (ref 8.9–10.3)
Chloride: 95 mmol/L — ABNORMAL LOW (ref 101–111)
GFR calc Af Amer: >60 mL/min (ref >60)
Glucose, Bld: 154 mg/dL — ABNORMAL HIGH (ref 65–99)
Potassium: 4.1 mmol/L (ref 3.5–5.1)
SODIUM: 130 mmol/L — AB (ref 135–145)

## 2017-05-15 LAB — EYE CULTURE: Gram Stain: NONE SEEN

## 2017-05-15 MED ORDER — POTASSIUM CHLORIDE CRYS ER 20 MEQ PO TBCR
40.0000 meq | EXTENDED_RELEASE_TABLET | Freq: Two times a day (BID) | ORAL | Status: AC
  Filled 2017-05-15: qty 2

## 2017-05-15 MED ORDER — LEVOFLOXACIN 750 MG PO TABS
750.0000 mg | ORAL_TABLET | ORAL | Status: DC
  Administered 2017-05-15 – 2017-05-19 (×3): 750 mg via ORAL
  Filled 2017-05-15 (×5): qty 1

## 2017-05-15 MED ORDER — FUROSEMIDE 10 MG/ML IJ SOLN: 40.0000 mg | Freq: Once | INTRAMUSCULAR | Status: DC

## 2017-05-15 MED ORDER — FUROSEMIDE 10 MG/ML IJ SOLN
20.0000 mg | Freq: Once | INTRAMUSCULAR | Status: DC
  Filled 2017-05-15: qty 2

## 2017-05-15 MED ORDER — PREDNISONE 20 MG PO TABS
40.0000 mg | ORAL_TABLET | Freq: Every day | ORAL | Status: DC
  Administered 2017-05-16 – 2017-05-19 (×4): 40 mg via ORAL
  Filled 2017-05-15 (×5): qty 2

## 2017-05-15 MED ORDER — LEVOFLOXACIN 500 MG PO TABS: 500.0000 mg | ORAL_TABLET | Freq: Every day | ORAL | Status: DC

## 2017-05-15 NOTE — Progress Notes (Signed)
[facility] PROGRESS NOTE    Progress Note  Jasmin Lee  YIA:[id number] DOB: 1922/11/06 DOA: 05/12/2017 PCP: [name], MD     Brief Narrative:   Jasmin Lee is an 83 y.o. female past medical history of chronic atrial fibrillation on Eliquis, COPD comes into the hospital complaining of shortness of breath that started 3 days prior to admission with a productive cough, and x-ray was done in the ED that showed possible pneumonia with a leukocytosis of 16  Assessment/Plan:   Acute on chronic respiratory failure with hypoxia (HCC)/COPD. CAP (community acquired pneumonia) Started empirically on IV Levaquin, continue inhalers and supplemental oxygen. Speech therapy was consulted recommended a dysphagia III diet due to risk of aspiration. Consult PT OT recommended skilled Started on low-dose steroids as she is wheezing today. She also has mild JVD and, she still has mild JVD will give her a single dose of IV Lasix and treat her potassium orally recheck a basic metabolic panel in the morning. Her antibiotics and steroids to an oral form.  Probably do a quick steroid taper. Social worker to assist with placement, the patient would like to speak to the social worker about her options. Awaiting social worker placement.  Acute bacterial conjunctivitis with excessive purulent discharge: Started on Cipro drops in the ED  HypoKalemia: Replete electrolytes orally. Resolved recheck basic metabolic panel in the morning.  Chronic atrial fibrillation: Rate controlled continue Eliquis.  No signs of overt bleeding.  Essential hypertension: Changes made continue current home regimen.  Protein-calorie malnutrition, moderate (HCC) True 3 times daily.  DVT prophylaxis: eluquis Family Communication:none Disposition Plan/Barrier to D/C: Hopefully skilled nursing facility placement.  Tomorrow  Code Status:     Code Status Orders  (From admission, onward)        Start      Ordered   05/13/17 0621  Do not attempt resuscitation (DNR)  Continuous    Question Answer Comment  In the event of cardiac or respiratory ARREST Do not call a "code blue"   In the event of cardiac or respiratory ARREST Do not perform Intubation, CPR, defibrillation or ACLS   In the event of cardiac or respiratory ARREST Use medication by any route, position, wound care, and other measures to relive pain and suffering. May use oxygen, suction and manual treatment of airway obstruction as needed for comfort.      05/13/17 0620    Code Status History    Date Active Date Inactive Code Status Order ID Comments User Context   11/24/2016 18:24 11/27/2016 15:51 DNR [id number]  Louellen Molder, MD Inpatient   10/05/2016 16:18 10/08/2016 20:51 Full Code [id number]  [name], PA-C ED   05/11/2014 15:22 05/12/2014 20:48 Full Code [id number]  [name], MD Inpatient   06/12/2013 02:28 06/13/2013 18:46 Partial Code [id number]  [name], PA-C Inpatient   06/11/2013 22:03 06/12/2013 02:28 Full Code [id number]  [name], MD Inpatient        IV Access:    Peripheral IV   Procedures and diagnostic studies:   Dg Swallowing Func-speech Pathology  Result Date: 05/14/2017 Objective Swallowing Evaluation: Type of Study: MBS-Modified Barium Swallow Study  Patient Details Name: Jasmin Lee MRN: [id number] Date of Birth: 12-Aug-1922 Today's Date: 05/14/2017 Time: SLP Start Time (ACUTE ONLY): 1315 -SLP Stop Time (ACUTE ONLY): 1330 SLP Time Calculation (min) (ACUTE ONLY): 15 min Past Medical History: Past Medical History: Diagnosis Date . Anemia  . Aortic atherosclerosis ([place]) 2015  CTA Chest: Diffuse atherosclerosis of the aorta, great vessels and coronary arteries . Arthritis   "in my legs and feet" (05/11/2014) . Cardiomyopathy ([place])  . Chronic bronchitis ([place]) "2-3 times/yr" . Complication of anesthesia   "started flinching at start of hysterectomy; they had to give me more  anesthetic" . COPD with asthma ([place])  . Dyslipidemia  . Echocardiogram abnormal 12/05/2010  Moderate concentric left ventricular hypertrophy EF > 77% stage I diastolic dysfunction, left atrium mild to moderate dilatation, moderate mitral annular calcification with trace MR. Trace TR. Marland Kitchen HTN (hypertension)  . Hyponatremia 11/2016 . Pneumonia "several times" . Squamous carcinoma   "left face and thigh" . Uterine cancer [facility])  Past Surgical History: Past Surgical History: Procedure Laterality Date . APPENDECTOMY  1954 . BREAST BIOPSY Left   "it was nothing" . CARDIAC EVENT MONITOR  12/05/10-01/02/11  SINUS RHYTHM . CATARACT EXTRACTION W/ INTRAOCULAR LENS  IMPLANT, BILATERAL Bilateral  . CHOLECYSTECTOMY  2003 . DILATION AND CURETTAGE OF UTERUS  1966 . SQUAMOUS CELL CARCINOMA EXCISION Left   face and thigh . TRANSTHORACIC ECHOCARDIOGRAM  04/2014   Normal LV size and function Hyperdynamic left ventricle.Moderate concentric LVH with impaired relaxation.  Mild aortic stenosis . VAGINAL HYSTERECTOMY  1966 HPI: OMNI DUNSWORTH a 83 y.o.femalewith medical history significant for atrial fibrillation, COPD, hyperlipidemia, hypothyroidism, pna, asthma came to the hospital with complains of shortness of breath and because "I couldn't see" (per pt- have conjunctivitis?). Chest x-ray 12/20 Small bilateral pleural effusions with associated atelectasis and possible left lower lobe consolidation. Affirms coughing during meals "sometimes".  Subjective: alert, communicative Assessment / Plan / Recommendation CHL IP CLINICAL IMPRESSIONS 05/14/2017 Clinical Impression Pt presents with functional oropharyngeal swallow with age-related changes considered to be normal.  There is mildly discoordinated oral preparation of boluses; pharyngeal swallow triggers at level of pyriform sinuses; there is no residue remaining in pharynx post-swallow.  During initial swallow of thin liquids, there may have been an incident of trace aspiration, but the  fluoroscopy was shut off prematurely - aspiration could not be verified. However, during all subsequent swallows, pt closed laryngeal vestibule reliably and neither penetrated nor aspirated any materials.  Recommend resuming a regular diet, thin liquids.  No further SLP needs are identified.  SLP Visit Diagnosis Dysphagia, unspecified (R13.10) Attention and concentration deficit following -- Frontal lobe and executive function deficit following -- Impact on safety and function Mild aspiration risk   CHL IP TREATMENT RECOMMENDATION 05/14/2017 Treatment Recommendations No treatment recommended at this time   No flowsheet data found. CHL IP DIET RECOMMENDATION 05/14/2017 SLP Diet Recommendations Thin liquid;Regular solids Liquid Administration via Cup Medication Administration Whole meds with liquid Compensations -- Postural Changes --   CHL IP OTHER RECOMMENDATIONS 05/14/2017 Recommended Consults -- Oral Care Recommendations Oral care BID Other Recommendations --   CHL IP FOLLOW UP RECOMMENDATIONS 05/14/2017 Follow up Recommendations None   No flowsheet data found.     CHL IP ORAL PHASE 05/14/2017 Oral Phase WFL Oral - Pudding Teaspoon -- Oral - Pudding Cup -- Oral - Honey Teaspoon -- Oral - Honey Cup -- Oral - Nectar Teaspoon -- Oral - Nectar Cup -- Oral - Nectar Straw -- Oral - Thin Teaspoon -- Oral - Thin Cup -- Oral - Thin Straw -- Oral - Puree -- Oral - Mech Soft -- Oral - Regular -- Oral - Multi-Consistency -- Oral - Pill -- Oral Phase - Comment --  CHL IP PHARYNGEAL PHASE 05/14/2017 Pharyngeal Phase WFL Pharyngeal- Pudding Teaspoon --  Pharyngeal -- Pharyngeal- Pudding Cup -- Pharyngeal -- Pharyngeal- Honey Teaspoon -- Pharyngeal -- Pharyngeal- Honey Cup -- Pharyngeal -- Pharyngeal- Nectar Teaspoon -- Pharyngeal -- Pharyngeal- Nectar Cup -- Pharyngeal -- Pharyngeal- Nectar Straw -- Pharyngeal -- Pharyngeal- Thin Teaspoon -- Pharyngeal -- Pharyngeal- Thin Cup -- Pharyngeal -- Pharyngeal- Thin Straw -- Pharyngeal  -- Pharyngeal- Puree -- Pharyngeal -- Pharyngeal- Mechanical Soft -- Pharyngeal -- Pharyngeal- Regular -- Pharyngeal -- Pharyngeal- Multi-consistency -- Pharyngeal -- Pharyngeal- Pill -- Pharyngeal -- Pharyngeal Comment --  CHL IP CERVICAL ESOPHAGEAL PHASE 05/14/2017 Cervical Esophageal Phase (No Data) Pudding Teaspoon -- Pudding Cup -- Honey Teaspoon -- Honey Cup -- Nectar Teaspoon -- Nectar Cup -- Nectar Straw -- Thin Teaspoon -- Thin Cup -- Thin Straw -- Puree -- Mechanical Soft -- Regular -- Multi-consistency -- Pill -- Cervical Esophageal Comment -- No flowsheet data found. Juan Quam Laurice 05/14/2017, 2:06 PM                Medical Consultants:    None.  Anti-Infectives:   IV Levaquin  Subjective:    KEIARRA CHARON she relates her breathing is better  Objective:    Vitals:   05/14/17 2100 05/15/17 0529 05/15/17 0903 05/15/17 0907  BP:  (!) 163/73    Pulse:  74    Resp:  18    Temp:  98.4 F (36.9 C)    TempSrc:  Oral    SpO2: 98% 97% 95% 95%  Weight:      Height:        Intake/Output Summary (Last 24 hours) at 05/15/2017 1002 Last data filed at 05/15/2017 0900 Gross per 24 hour  Intake 120 ml  Output -  Net 120 ml   Filed Weights   05/12/17 2315  Weight: 49.9 kg (110 lb)    Exam: General exam: In no acute distress. Respiratory system: Good air movement and clear Cardiovascular system: S1 & S2 heard, RRR.  Gastrointestinal system: Abdomen is nondistended, soft and nontender.  Central nervous system: Alert and oriented. No focal neurological deficits. Extremities: No pedal edema. Skin: No rashes, lesions or ulcers Psychiatry: Judgement and insight appear normal. Mood & affect appropriate.    Data Reviewed:    Labs: Basic Metabolic Panel: Recent Labs  Lab 05/12/17 2327 05/13/17 1220 05/15/17 0614  NA 130* 129* 130*  K 3.1* 3.7 4.1  CL 93* 94* 95*  CO2 27 27 27   GLUCOSE 114* 119* 154*  BUN 15 15 17   CREATININE 0.74 0.68 0.70  CALCIUM  8.1* 7.6* 8.2*   GFR Estimated Creatinine Clearance: 31.5 mL/min (by C-G formula based on SCr of 0.7 mg/dL). Liver Function Tests: Recent Labs  Lab 05/13/17 1220  AST 36  ALT 24  ALKPHOS 99  BILITOT 0.9  PROT 4.6*  ALBUMIN 2.2*   No results for input(s): LIPASE, AMYLASE in the last 168 hours. No results for input(s): AMMONIA in the last 168 hours. Coagulation profile No results for input(s): INR, PROTIME in the last 168 hours.  CBC: Recent Labs  Lab 05/12/17 2327 05/13/17 1220  WBC 16.2* 19.9*  NEUTROABS 13.9* 18.0*  HGB 11.1* 10.9*  HCT 32.0* 32.1*  MCV 82.5 82.9  PLT 301 298   Cardiac Enzymes: No results for input(s): CKTOTAL, CKMB, CKMBINDEX, TROPONINI in the last 168 hours. BNP (last 3 results) No results for input(s): PROBNP in the last 8760 hours. CBG: No results for input(s): GLUCAP in the last 168 hours. D-Dimer: No results for input(s): DDIMER in the last  72 hours. Hgb A1c: No results for input(s): HGBA1C in the last 72 hours. Lipid Profile: No results for input(s): CHOL, HDL, LDLCALC, TRIG, CHOLHDL, LDLDIRECT in the last 72 hours. Thyroid function studies: No results for input(s): TSH, T4TOTAL, T3FREE, THYROIDAB in the last 72 hours.  Invalid input(s): FREET3 Anemia work up: No results for input(s): VITAMINB12, FOLATE, FERRITIN, TIBC, IRON, RETICCTPCT in the last 72 hours. Sepsis Labs: Recent Labs  Lab 05/12/17 2327 05/12/17 2355 05/13/17 1220  WBC 16.2*  --  19.9*  LATICACIDVEN  --  0.98  --    Microbiology Recent Results (from the past 240 hour(s))  Eye culture     Status: None (Preliminary result)   Collection Time: 05/13/17 12:11 AM  Result Value Ref Range Status   Specimen Description EYE EXUDATE  Final   Special Requests NONE  Final   Gram Stain NO WBC SEEN NO ORGANISMS SEEN   Final   Culture   Final    ABUNDANT HAEMOPHILUS INFLUENZAE BETA LACTAMASE NEGATIVE CRITICAL RESULT CALLED TO, READ BACK BY AND VERIFIED WITH: Glynn Octave, RN  AT 1332 05/14/17 BY L BENFIELD Performed at [facility], [place] [address] [place], [place] [address]    Report Status PENDING  Incomplete  Respiratory Panel by PCR     Status: None   Collection Time: 05/13/17  3:56 AM  Result Value Ref Range Status   Adenovirus NOT DETECTED NOT DETECTED Final   Coronavirus 229E NOT DETECTED NOT DETECTED Final   Coronavirus HKU1 NOT DETECTED NOT DETECTED Final   Coronavirus NL63 NOT DETECTED NOT DETECTED Final   Coronavirus OC43 NOT DETECTED NOT DETECTED Final   Metapneumovirus NOT DETECTED NOT DETECTED Final   Rhinovirus / Enterovirus NOT DETECTED NOT DETECTED Final   Influenza A NOT DETECTED NOT DETECTED Final   Influenza B NOT DETECTED NOT DETECTED Final   Parainfluenza Virus 1 NOT DETECTED NOT DETECTED Final   Parainfluenza Virus 2 NOT DETECTED NOT DETECTED Final   Parainfluenza Virus 3 NOT DETECTED NOT DETECTED Final   Parainfluenza Virus 4 NOT DETECTED NOT DETECTED Final   Respiratory Syncytial Virus NOT DETECTED NOT DETECTED Final   Bordetella pertussis NOT DETECTED NOT DETECTED Final   Chlamydophila pneumoniae NOT DETECTED NOT DETECTED Final   Mycoplasma pneumoniae NOT DETECTED NOT DETECTED Final  Culture, blood (routine x 2) Call MD if unable to obtain prior to antibiotics being given     Status: None (Preliminary result)   Collection Time: 05/13/17 12:06 PM  Result Value Ref Range Status   Specimen Description BLOOD RIGHT ARM  Final   Special Requests   Final    BOTTLES DRAWN AEROBIC AND ANAEROBIC Blood Culture adequate volume   Culture   Final    NO GROWTH < 24 HOURS Performed at [facility], [address] [place], [place] [address]    Report Status PENDING  Incomplete  Culture, blood (routine x 2) Call MD if unable to obtain prior to antibiotics being given     Status: None (Preliminary result)   Collection Time: 05/13/17 12:20 PM  Result Value Ref Range Status   Specimen Description BLOOD RIGHT ANTECUBITAL  Final    Special Requests   Final    BOTTLES DRAWN AEROBIC AND ANAEROBIC Blood Culture adequate volume   Culture   Final    NO GROWTH < 24 HOURS Performed at [facility], [address] [place], [place] [address]    Report Status PENDING  Incomplete  Medications:   . acetaminophen  1,000 mg Oral TID  . apixaban  2.5 mg Oral BID  . atorvastatin  10 mg Oral QODAY  . chlorhexidine  15 mL Mouth Rinse BID  . cholecalciferol  1,000 Units Oral Daily  . diltiazem  180 mg Oral Daily  . erythromycin   Both Eyes Q6H  . furosemide  20 mg Oral Daily  . hydrALAZINE  10 mg Oral Q8H  . ipratropium-albuterol  3 mL Nebulization TID  . levothyroxine  25 mcg Oral QAC breakfast  . mouth rinse  15 mL Mouth Rinse q12n4p  . methylPREDNISolone (SOLU-MEDROL) injection  40 mg Intravenous Q12H  . mometasone-formoterol  2 puff Inhalation BID  . montelukast  10 mg Oral QHS  . multivitamin with minerals  1 tablet Oral Q breakfast  . pantoprazole  40 mg Oral Daily  . polyvinyl alcohol  1 drop Both Eyes TID  . cyanocobalamin  1,000 mcg Oral Q breakfast   Continuous Infusions: . levofloxacin (LEVAQUIN) IV        LOS: 2 days   [facility] Pager [phone]  *Please refer to [email or web address], password [email or web address] to get updated schedule on who will round on this patient, as hospitalists switch teams weekly. If 7PM-7AM, please contact night-coverage at [email or web address], password [email or web address] for any overnight needs.  05/15/2017, 10:02 AM

## 2017-05-15 NOTE — Progress Notes (Signed)
PHARMACY NOTE -  ANTIBIOTIC RENAL DOSE ADJUSTMENT   Patient has been initiated on Levaquin 750mg  IV q48h for CAP.  Received 1st dose on 12/20.  Changing to oral therapy today.  D3 Levaquin SCr 0.7, estimated CrCl ~30 ml/min Renal function stable,  Continue Levaquin 750mg  PO every other day to complete 5-7 days of therapy.   Netta Cedars, PharmD, BCPS

## 2017-05-16 DIAGNOSIS — R0602 Shortness of breath: Secondary | ICD-10-CM

## 2017-05-16 DIAGNOSIS — J189 Pneumonia, unspecified organism: Principal | ICD-10-CM

## 2017-05-16 LAB — BASIC METABOLIC PANEL
Anion gap: 7 (ref 5–15)
BUN: 18 mg/dL (ref 6–20)
CHLORIDE: 97 mmol/L — AB (ref 101–111)
CO2: 29 mmol/L (ref 22–32)
CREATININE: 0.75 mg/dL (ref 0.44–1.00)
Calcium: 8.1 mg/dL — ABNORMAL LOW (ref 8.9–10.3)
GFR calc Af Amer: >60 mL/min (ref >60)
Glucose, Bld: 90 mg/dL (ref 65–99)
POTASSIUM: 3.7 mmol/L (ref 3.5–5.1)
SODIUM: 133 mmol/L — AB (ref 135–145)

## 2017-05-16 LAB — PROCALCITONIN: Procalcitonin: 0.23 ng/mL

## 2017-05-16 NOTE — NC FL2 (Signed)
[place] LEVEL OF CARE SCREENING TOOL     IDENTIFICATION  Patient Name: Jasmin Lee Birthdate: March 23, 1923 Sex: female Admission Date (Current Location): 05/12/2017  [facility] and [place] Number:  Herbalist and Address:  [facility],  [place] [address], [place]      Provider Number: [id number]  Attending Physician Name and Address:  Bonnell Public, MD  Relative Name and Phone Number:       Current Level of Care: Hospital Recommended Level of Care: [place] Prior Approval Number:    Date Approved/Denied:   PASRR Number: [id number]  Discharge Plan: SNF    Current Diagnoses: Patient Active Problem List   Diagnosis Date Noted  . CAP (community acquired pneumonia) 05/13/2017  . Acute on chronic respiratory failure with hypoxia ([place]) 05/13/2017  . Pressure injury of skin 05/13/2017  . Protein-calorie malnutrition, moderate ([place]) 02/18/2017  . Atrial fibrillation, chronic ([place]) 02/05/2017  . History of palpitations 02/04/2017  . Fall   . Sacral fracture, closed ([place]) 10/12/2016  . Physical deconditioning   . Left hip pain 10/05/2016  . Anemia due to chronic kidney disease   . Bilateral lower extremity edema 11/15/2014  . Extrinsic asthma 05/12/2014  . Weakness 06/21/2013  . Hyponatremia 06/11/2013  . COPD (chronic obstructive pulmonary disease) ([place]) 06/11/2013  . Hypokalemia 06/11/2013  . Anemia 06/11/2013  . Essential hypertension   . Dyslipidemia     Orientation RESPIRATION BLADDER Height & Weight     Self, Situation, Place  O2(2L [place]) Continent Weight: 110 lb (49.9 kg) Height:  4\' 10"  (147.3 cm)  BEHAVIORAL SYMPTOMS/MOOD NEUROLOGICAL BOWEL NUTRITION STATUS      Continent Diet(see DC summary)  AMBULATORY STATUS COMMUNICATION OF NEEDS Skin   Extensive Assist Verbally PU Stage and Appropriate Care   PU Stage 2 Dressing: (on sacrum- foam dressing changes)                   Personal  Care Assistance Level of Assistance  Bathing, Dressing Bathing Assistance: Maximum assistance   Dressing Assistance: Maximum assistance     Functional Limitations Info             SPECIAL CARE FACTORS FREQUENCY  PT (By licensed PT), OT (By licensed OT)     PT Frequency: 5/wk OT Frequency: 5/wk            Contractures      Additional Factors Info  Code Status, Allergies Code Status Info: DNR Allergies Info: Almond (Diagnostic), Asa Aspirin, Coconut Flavor, Neomycin, Peanut-containing Drug Products, Penicillins, Sulfa Antibiotics, Tizanidine, Lisinopril, Keflex Cephalexin           Current Medications (05/16/2017):  This is the current hospital active medication list Current Facility-Administered Medications  Medication Dose Route Frequency Provider Last Rate Last Dose  . acetaminophen (TYLENOL) tablet 1,000 mg  1,000 mg Oral TID Amin, Ankit Chirag, MD   1,000 mg at 05/13/17 1245  . apixaban (ELIQUIS) tablet 2.5 mg  2.5 mg Oral BID Amin, Ankit Chirag, MD   2.5 mg at 05/16/17 0842  . atorvastatin (LIPITOR) tablet 10 mg  10 mg Oral QODAY Amin, Ankit Chirag, MD      . chlorhexidine (PERIDEX) 0.12 % solution 15 mL  15 mL Mouth Rinse BID Charlynne Cousins, MD   15 mL at 05/15/17 2200  . cholecalciferol (VITAMIN D) tablet 1,000 Units  1,000 Units Oral Daily Charlynne Cousins, MD      .  dextromethorphan-guaiFENesin (MUCINEX DM) 30-600 MG per 12 hr tablet 1 tablet  1 tablet Oral BID PRN Damita Lack, MD   1 tablet at 05/16/17 0203  . diltiazem (CARDIZEM CD) 24 hr capsule 180 mg  180 mg Oral Daily Amin, Ankit Chirag, MD   180 mg at 05/15/17 1100  . erythromycin ophthalmic ointment   Both Eyes Q6H Amin, Ankit Chirag, MD      . furosemide (LASIX) injection 20 mg  20 mg Intravenous Once Aileen Fass, Tammi Klippel, MD      . furosemide (LASIX) tablet 20 mg  20 mg Oral Daily Amin, Ankit Chirag, MD   20 mg at 05/14/17 1132  . hydrALAZINE (APRESOLINE) tablet 10 mg  10 mg Oral Q8H  Amin, Ankit Chirag, MD   10 mg at 05/16/17 0739  . ipratropium-albuterol (DUONEB) 0.5-2.5 (3) MG/3ML nebulizer solution 3 mL  3 mL Nebulization TID [name], MD   3 mL at 05/16/17 0902  . levofloxacin (LEVAQUIN) tablet 750 mg  750 mg Oral Q48H Thomes Lolling, RPH   750 mg at 05/15/17 1102  . levothyroxine (SYNTHROID, LEVOTHROID) tablet 25 mcg  25 mcg Oral QAC breakfast Amin, Ankit Chirag, MD      . MEDLINE mouth rinse  15 mL Mouth Rinse q12n4p Charlynne Cousins, MD   15 mL at 05/14/17 1511  . mometasone-formoterol (DULERA) 200-5 MCG/ACT inhaler 2 puff  2 puff Inhalation BID Charlynne Cousins, MD   2 puff at 05/16/17 0902  . montelukast (SINGULAIR) tablet 10 mg  10 mg Oral QHS Amin, Ankit Chirag, MD   10 mg at 05/13/17 2141  . multivitamin with minerals tablet 1 tablet  1 tablet Oral Q breakfast Amin, Jeanella Flattery, MD   1 tablet at 05/13/17 1245  . pantoprazole (PROTONIX) EC tablet 40 mg  40 mg Oral Daily Amin, Ankit Chirag, MD   40 mg at 05/13/17 1246  . polyethylene glycol (MIRALAX / GLYCOLAX) packet 17 g  17 g Oral Daily PRN Amin, Ankit Chirag, MD      . polyvinyl alcohol (LIQUIFILM TEARS) 1.4 % ophthalmic solution 1 drop  1 drop Both Eyes TID Damita Lack, MD   1 drop at 05/15/17 2158  . predniSONE (DELTASONE) tablet 40 mg  40 mg Oral Q breakfast Charlynne Cousins, MD      . traMADol Veatrice Bourbon) tablet 50 mg  50 mg Oral Q6H PRN Amin, Ankit Chirag, MD      . vitamin B-12 (CYANOCOBALAMIN) tablet 1,000 mcg  1,000 mcg Oral Q breakfast Amin, Jeanella Flattery, MD         Discharge Medications: Please see discharge summary for a list of discharge medications.  Relevant Imaging Results:  Relevant Lab Results:   Additional Information SSN: [id number]  Jorge Ny, LCSW

## 2017-05-16 NOTE — Clinical Social Work Note (Signed)
Clinical Social Work Assessment  Patient Details  Name: Jasmin Lee MRN: [id number] Date of Birth: 1923/04/21  Date of referral:  05/16/17               Reason for consult:  Facility Placement                Permission sought to share information with:  Facility Art therapist granted to share information::  No(pt disoriented- spoke with next of kin)  Name::     Financial planner::  SNF  Relationship::  dtr  Contact Information:     Housing/Transportation Living arrangements for the past 2 months:  [place] of Information:  Patient Patient Interpreter Needed:  None Criminal Activity/Legal Involvement Pertinent to Current Situation/Hospitalization:  No - Comment as needed Significant Relationships:  Adult Children Lives with:  Self Do you feel safe going back to the place where you live?  No Need for family participation in patient care:  Yes (Comment)(decision making at this time)  Care giving concerns:  Pt lives at home alone- has aid come in 3x wk and has neighbor who helps other days.  Pt unable to return home safely given current physical impairment and mental status change.   Social Worker assessment / plan:  CSW spoke with pt dtr concerning PT recommendation for SNF.  They are familiar with SNF from previous stay and understand referral process.  Employment status:  Retired Nurse, adult PT Recommendations:  [place] / Referral to community resources:  [place]  Patient/Family's Response to care: Pt dtr agreeable to SNF and hopeful CSW can help provide guidance about which facilities would be best for patient since she lives so far away.  Patient/Family's Understanding of and Emotional Response to Diagnosis, Current Treatment, and Prognosis:  Pt dtr has good understanding of pt condition and is concerned with her current mental status- hopeful it is just temporary  change.  Emotional Assessment Appearance:  Appears stated age Attitude/Demeanor/Rapport:  Unable to Assess Affect (typically observed):  Unable to Assess Orientation:  Oriented to Self Alcohol / Substance use:  Not Applicable Psych involvement (Current and /or in the community):  No (Comment)  Discharge Needs  Concerns to be addressed:  Care Coordination Readmission within the last 30 days:  No Current discharge risk:  Physical Impairment Barriers to Discharge:  Continued Medical Work up   Jorge Ny, LCSW 05/16/2017, 11:34 AM

## 2017-05-16 NOTE — Progress Notes (Addendum)
[facility] PROGRESS NOTE    Progress Note  Jasmin Lee  LOV:[id number] DOB: 07-Oct-1922 DOA: 05/12/2017 PCP: [name], MD     Brief Narrative:   Jasmin Lee is an 83 y.o. female past medical history of chronic atrial fibrillation on Eliquis, COPD comes into the hospital complaining of shortness of breath that started 3 days prior to admission with a productive cough, and x-ray was done in the ED that showed possible pneumonia with a leukocytosis.  05/16/17: Patient seen. No history from patient. Will repeat CBC and Pro Calcitonin in am. Will pursue disposition if patient remains stable.  Assessment/Plan:   Acute on chronic respiratory failure with hypoxia (HCC)/COPD. CAP (community acquired pneumonia) - Complete course of antibiotics - CBC and pro calcitonin in am. - On oral levaquin. - Respiratory failure is resolving, but patient is still wheezing. -BNP and CXR in am. - Continue neb treatment. Speech therapy was consulted recommended a dysphagia III diet due to risk of aspiration. Consult PT OT recommended skilled Started on low-dose steroids as she is wheezing today. Pursue disposition.   Acute bacterial conjunctivitis with excessive purulent discharge: Started on Cipro drops in the ED  HypoKalemia: Resolved.  Chronic atrial fibrillation: Rate controlled continue Eliquis.  No signs of overt bleeding.  Essential hypertension: Changes made continue current home regimen.  Protein-calorie malnutrition, moderate (HCC) True 3 times daily.  DVT prophylaxis: eluquis Family Communication:none Disposition Plan/Barrier to D/C: Hopefully skilled nursing facility placement.  Tomorrow  Code Status:     Code Status Orders  (From admission, onward)        Start     Ordered   05/13/17 0621  Do not attempt resuscitation (DNR)  Continuous    Question Answer Comment  In the event of cardiac or respiratory ARREST Do not call a "code blue"   In the  event of cardiac or respiratory ARREST Do not perform Intubation, CPR, defibrillation or ACLS   In the event of cardiac or respiratory ARREST Use medication by any route, position, wound care, and other measures to relive pain and suffering. May use oxygen, suction and manual treatment of airway obstruction as needed for comfort.      05/13/17 0620    Code Status History    Date Active Date Inactive Code Status Order ID Comments User Context   11/24/2016 18:24 11/27/2016 15:51 DNR [id number]  Louellen Molder, MD Inpatient   10/05/2016 16:18 10/08/2016 20:51 Full Code [id number]  [name], PA-C ED   05/11/2014 15:22 05/12/2014 20:48 Full Code [id number]  [name], MD Inpatient   06/12/2013 02:28 06/13/2013 18:46 Partial Code [id number]  [name], PA-C Inpatient   06/11/2013 22:03 06/12/2013 02:28 Full Code [id number]  [name], MD Inpatient        IV Access:    Peripheral IV   Procedures and diagnostic studies:   No results found.   Medical Consultants:    None.  Anti-Infectives:   IV Levaquin  Subjective:   No new complaints. Poor historian.  Objective:    Vitals:   05/15/17 2104 05/16/17 0550 05/16/17 0902 05/16/17 1416  BP:  (!) 164/100  135/74  Pulse:  100 95 88  Resp:  15 15 18   Temp:  97.9 F (36.6 C)  98.2 F (36.8 C)  TempSrc:  Oral  Oral  SpO2: 99% 97% 98% 100%  Weight:      Height:        Intake/Output Summary (Last 24  hours) at 05/16/2017 1642 Last data filed at 05/16/2017 1300 Gross per 24 hour  Intake 480 ml  Output 400 ml  Net 80 ml   Filed Weights   05/12/17 2315  Weight: 49.9 kg (110 lb)    Exam: General exam: In no acute distress. Respiratory system: Decreased air entry. Expiratory wheeze, worse on right lung field. Cardiovascular system: S1 & S2 heard, RRR.  Gastrointestinal system: Abdomen is nondistended, soft and nontender.  Central nervous system: Alert and oriented. No focal neurological  deficits. Extremities: No pedal edema.  Data Reviewed:    Labs: Basic Metabolic Panel: Recent Labs  Lab 05/12/17 2327 05/13/17 1220 05/15/17 0614 05/16/17 0627  NA 130* 129* 130* 133*  K 3.1* 3.7 4.1 3.7  CL 93* 94* 95* 97*  CO2 27 27 27 29   GLUCOSE 114* 119* 154* 90  BUN 15 15 17 18   CREATININE 0.74 0.68 0.70 0.75  CALCIUM 8.1* 7.6* 8.2* 8.1*   GFR Estimated Creatinine Clearance: 31.5 mL/min (by C-G formula based on SCr of 0.75 mg/dL). Liver Function Tests: Recent Labs  Lab 05/13/17 1220  AST 36  ALT 24  ALKPHOS 99  BILITOT 0.9  PROT 4.6*  ALBUMIN 2.2*   No results for input(s): LIPASE, AMYLASE in the last 168 hours. No results for input(s): AMMONIA in the last 168 hours. Coagulation profile No results for input(s): INR, PROTIME in the last 168 hours.  CBC: Recent Labs  Lab 05/12/17 2327 05/13/17 1220  WBC 16.2* 19.9*  NEUTROABS 13.9* 18.0*  HGB 11.1* 10.9*  HCT 32.0* 32.1*  MCV 82.5 82.9  PLT 301 298   Cardiac Enzymes: No results for input(s): CKTOTAL, CKMB, CKMBINDEX, TROPONINI in the last 168 hours. BNP (last 3 results) No results for input(s): PROBNP in the last 8760 hours. CBG: No results for input(s): GLUCAP in the last 168 hours. D-Dimer: No results for input(s): DDIMER in the last 72 hours. Hgb A1c: No results for input(s): HGBA1C in the last 72 hours. Lipid Profile: No results for input(s): CHOL, HDL, LDLCALC, TRIG, CHOLHDL, LDLDIRECT in the last 72 hours. Thyroid function studies: No results for input(s): TSH, T4TOTAL, T3FREE, THYROIDAB in the last 72 hours.  Invalid input(s): FREET3 Anemia work up: No results for input(s): VITAMINB12, FOLATE, FERRITIN, TIBC, IRON, RETICCTPCT in the last 72 hours. Sepsis Labs: Recent Labs  Lab 05/12/17 2327 05/12/17 2355 05/13/17 1220 05/16/17 1300  PROCALCITON  --   --   --  0.23  WBC 16.2*  --  19.9*  --   LATICACIDVEN  --  0.98  --   --    Microbiology Recent Results (from the past 240  hour(s))  Eye culture     Status: None   Collection Time: 05/13/17 12:11 AM  Result Value Ref Range Status   Specimen Description EYE EXUDATE  Final   Special Requests NONE  Final   Gram Stain NO WBC SEEN NO ORGANISMS SEEN   Final   Culture   Final    ABUNDANT HAEMOPHILUS INFLUENZAE BETA LACTAMASE NEGATIVE CRITICAL RESULT CALLED TO, READ BACK BY AND VERIFIED WITH: Glynn Octave, RN AT 1332 05/14/17 BY L BENFIELD Performed at [facility], [place] [address] [place], [place] [address]    Report Status 05/15/2017 FINAL  Final  Respiratory Panel by PCR     Status: None   Collection Time: 05/13/17  3:56 AM  Result Value Ref Range Status   Adenovirus NOT DETECTED NOT DETECTED Final   Coronavirus 229E  NOT DETECTED NOT DETECTED Final   Coronavirus HKU1 NOT DETECTED NOT DETECTED Final   Coronavirus NL63 NOT DETECTED NOT DETECTED Final   Coronavirus OC43 NOT DETECTED NOT DETECTED Final   Metapneumovirus NOT DETECTED NOT DETECTED Final   Rhinovirus / Enterovirus NOT DETECTED NOT DETECTED Final   Influenza A NOT DETECTED NOT DETECTED Final   Influenza B NOT DETECTED NOT DETECTED Final   Parainfluenza Virus 1 NOT DETECTED NOT DETECTED Final   Parainfluenza Virus 2 NOT DETECTED NOT DETECTED Final   Parainfluenza Virus 3 NOT DETECTED NOT DETECTED Final   Parainfluenza Virus 4 NOT DETECTED NOT DETECTED Final   Respiratory Syncytial Virus NOT DETECTED NOT DETECTED Final   Bordetella pertussis NOT DETECTED NOT DETECTED Final   Chlamydophila pneumoniae NOT DETECTED NOT DETECTED Final   Mycoplasma pneumoniae NOT DETECTED NOT DETECTED Final  Culture, blood (routine x 2) Call MD if unable to obtain prior to antibiotics being given     Status: None (Preliminary result)   Collection Time: 05/13/17 12:06 PM  Result Value Ref Range Status   Specimen Description BLOOD RIGHT ARM  Final   Special Requests   Final    BOTTLES DRAWN AEROBIC AND ANAEROBIC Blood Culture adequate volume   Culture   Final    NO  GROWTH 3 DAYS Performed at [facility], [address], [place], [place] [address]    Report Status PENDING  Incomplete  Culture, blood (routine x 2) Call MD if unable to obtain prior to antibiotics being given     Status: None (Preliminary result)   Collection Time: 05/13/17 12:20 PM  Result Value Ref Range Status   Specimen Description BLOOD RIGHT ANTECUBITAL  Final   Special Requests   Final    BOTTLES DRAWN AEROBIC AND ANAEROBIC Blood Culture adequate volume   Culture   Final    NO GROWTH 3 DAYS Performed at [facility], [place] [address] [place], [place] [address]    Report Status PENDING  Incomplete     Medications:   . acetaminophen  1,000 mg Oral TID  . apixaban  2.5 mg Oral BID  . atorvastatin  10 mg Oral QODAY  . chlorhexidine  15 mL Mouth Rinse BID  . cholecalciferol  1,000 Units Oral Daily  . diltiazem  180 mg Oral Daily  . erythromycin   Both Eyes Q6H  . furosemide  20 mg Intravenous Once  . furosemide  20 mg Oral Daily  . hydrALAZINE  10 mg Oral Q8H  . ipratropium-albuterol  3 mL Nebulization TID  . levofloxacin  750 mg Oral Q48H  . levothyroxine  25 mcg Oral QAC breakfast  . mouth rinse  15 mL Mouth Rinse q12n4p  . mometasone-formoterol  2 puff Inhalation BID  . montelukast  10 mg Oral QHS  . multivitamin with minerals  1 tablet Oral Q breakfast  . pantoprazole  40 mg Oral Daily  . polyvinyl alcohol  1 drop Both Eyes TID  . predniSONE  40 mg Oral Q breakfast  . cyanocobalamin  1,000 mcg Oral Q breakfast   Continuous Infusions:     LOS: 3 days   Bonnell Public  [facility] Pager [phone]  *Please refer to [email or web address], password [email or web address] to get updated schedule on who will round on this patient, as hospitalists switch teams weekly. If 7PM-7AM, please contact night-coverage at [email or web address], password [email or web address] for any overnight needs.  05/16/2017, 4:42 PM

## 2017-05-17 ENCOUNTER — Inpatient Hospital Stay (HOSPITAL_COMMUNITY): Payer: Medicare HMO

## 2017-05-17 DIAGNOSIS — J9622 Acute and chronic respiratory failure with hypercapnia: Secondary | ICD-10-CM

## 2017-05-17 LAB — BASIC METABOLIC PANEL
Anion gap: 9 (ref 5–15)
BUN: 19 mg/dL (ref 6–20)
CHLORIDE: 94 mmol/L — AB (ref 101–111)
CO2: 28 mmol/L (ref 22–32)
CREATININE: 0.73 mg/dL (ref 0.44–1.00)
Calcium: 8.1 mg/dL — ABNORMAL LOW (ref 8.9–10.3)
Glucose, Bld: 134 mg/dL — ABNORMAL HIGH (ref 65–99)
POTASSIUM: 3.8 mmol/L (ref 3.5–5.1)
SODIUM: 131 mmol/L — AB (ref 135–145)

## 2017-05-17 LAB — CBC WITH DIFFERENTIAL/PLATELET
Basophils Absolute: 0 10*3/uL (ref 0.0–0.1)
Basophils Relative: 0 %
Eosinophils Absolute: 0 10*3/uL (ref 0.0–0.7)
Eosinophils Relative: 0 %
HCT: 34.4 % — ABNORMAL LOW (ref 36.0–46.0)
Hemoglobin: 11.4 g/dL — ABNORMAL LOW (ref 12.0–15.0)
Lymphocytes Relative: 11 %
Lymphs Abs: 1 10*3/uL (ref 0.7–4.0)
MCH: 27.9 pg (ref 26.0–34.0)
MCHC: 33.1 g/dL (ref 30.0–36.0)
MCV: 84.3 fL (ref 78.0–100.0)
Monocytes Absolute: 0.7 10*3/uL (ref 0.1–1.0)
Monocytes Relative: 8 %
Neutro Abs: 7.6 10*3/uL (ref 1.7–7.7)
Neutrophils Relative %: 81 %
Platelets: 390 10*3/uL (ref 150–400)
RBC: 4.08 MIL/uL (ref 3.87–5.11)
RDW: 16.3 % — ABNORMAL HIGH (ref 11.5–15.5)
WBC: 9.3 10*3/uL (ref 4.0–10.5)

## 2017-05-17 LAB — PROCALCITONIN: Procalcitonin: 0.18 ng/mL

## 2017-05-17 LAB — BRAIN NATRIURETIC PEPTIDE: B Natriuretic Peptide: 310.8 pg/mL — ABNORMAL HIGH (ref 0.0–100.0)

## 2017-05-17 MED ORDER — ALBUTEROL SULFATE (2.5 MG/3ML) 0.083% IN NEBU: 2.5000 mg | INHALATION_SOLUTION | RESPIRATORY_TRACT | Status: DC | PRN

## 2017-05-17 NOTE — Progress Notes (Signed)
Physical Therapy Treatment Patient Details Name: Jasmin Lee MRN: [id number] DOB: Aug 23, 1922 Today's Date: 05/17/2017    History of Present Illness Jasmin Lee is a 83 y.o. female with medical history significant for atrial fibrillation, COPD, hyperlipidemia, hypothyroidism, pna, asthma came to the hospital 05/12/17 with complains of shortness of breath and because "I couldn't see" (per pt- have conjunctivitis?). Chest x-ray 12/20 Small bilateral pleural effusions with associated atelectasis and possible left lower lobe consolidation    PT Comments    2nd tx to assist nursing with getting pt back to bed. Used STEDY this session. Mild improvement in weight shift/rise to standing. Pt still tends to maintain flexed posture and becomes fearful of falling. Will need SNF.    Follow Up Recommendations  SNF     Equipment Recommendations  None recommended by PT    Recommendations for Other Services       Precautions / Restrictions Precautions Precautions: Fall Restrictions Weight Bearing Restrictions: No    Mobility  Bed Mobility Overal bed mobility: Needs Assistance Bed Mobility: Sit to Supine      Sit to supine: Mod assist;HOB elevated   General bed mobility comments: Encouragement for pt to perform as much of task, unassisted, as she could. Assist for trunk, LEs. Increased time.   Transfers Overall transfer level: Needs assistance Equipment used: Rolling walker (2 wheeled) Transfers: Sit to/from Stand Sit to Stand: Mod assist;+2 physical assistance;+2 safety/equipment        General transfer comment: Mod assist +2 to rise from sitting using STEDY. Transfer from recliner to bed using STEDY as well. Max cues for trunk/ hip extension, anterior weight shifting.   Ambulation/Gait             General Gait Details: NT-pt too fearful of falling, safety reasons/high fall risk.    Stairs            Wheelchair Mobility    Modified Rankin (Stroke Patients  Only)       Balance Overall balance assessment: Needs assistance Sitting-balance support: Bilateral upper extremity supported;Feet supported Sitting balance-Leahy Scale: Fair       Standing balance-Leahy Scale: Zero                              Cognition Arousal/Alertness: Awake/alert Behavior During Therapy: Anxious Overall Cognitive Status: Within Functional Limits for tasks assessed                                 General Comments: pt very fearful of falling-increased anxiety      Exercises      General Comments        Pertinent Vitals/Pain Pain Assessment: Faces Faces Pain Scale: Hurts little more Pain Location: shoulders, hands Pain Descriptors / Indicators: Sore;Aching;Discomfort Pain Intervention(s): Limited activity within patient's tolerance;Repositioned    Home Living                      Prior Function            PT Goals (current goals can now be found in the care plan section) Progress towards PT goals: Progressing toward goals    Frequency    Min 2X/week      PT Plan Current plan remains appropriate    Co-evaluation              AM-PAC PT "6  Clicks" Daily Activity  Outcome Measure  Difficulty turning over in bed (including adjusting bedclothes, sheets and blankets)?: Unable Difficulty moving from lying on back to sitting on the side of the bed? : Unable Difficulty sitting down on and standing up from a chair with arms (e.g., wheelchair, bedside commode, etc,.)?: Unable Help needed moving to and from a bed to chair (including a wheelchair)?: Total Help needed walking in hospital room?: Total Help needed climbing 3-5 steps with a railing? : Total 6 Click Score: 6    End of Session Equipment Utilized During Treatment: Gait belt Activity Tolerance: Patient limited by fatigue Patient left: in bed;with call bell/phone within reach;with bed alarm set   PT Visit Diagnosis: Muscle weakness  (generalized) (M62.81);Difficulty in walking, not elsewhere classified (R26.2)     Time: 7902-4097 PT Time Calculation (min) (ACUTE ONLY): 13 min  Charges:  $Therapeutic Activity: 8-22 mins                    G Codes:          Weston Anna, MPT Pager: [phone]

## 2017-05-17 NOTE — Progress Notes (Signed)
Physical Therapy Treatment Patient Details Name: Jasmin Lee MRN: [id number] DOB: Nov 24, 1922 Today's Date: 05/17/2017    History of Present Illness Jasmin Lee is a 83 y.o. female with medical history significant for atrial fibrillation, COPD, hyperlipidemia, hypothyroidism, pna, asthma came to the hospital 05/12/17 with complains of shortness of breath and because "I couldn't see" (per pt- have conjunctivitis?). Chest x-ray 12/20 Small bilateral pleural effusions with associated atelectasis and possible left lower lobe consolidation    PT Comments    Moderate encouragement for progression of activity. High fall risk. Pt is very anxious and fearful of falling. She feels as if she is falling forward so she panics and pushes into posterior direction/resists anterior weight shifting.  She fatigues with minimal activity. O2 sats remained >90% on RA during session (replaced [place] O2 at end of session). Continue to recommend SNF for rehab.    Follow Up Recommendations  SNF     Equipment Recommendations  None recommended by PT    Recommendations for Other Services       Precautions / Restrictions Precautions Precautions: Fall Restrictions Weight Bearing Restrictions: No    Mobility  Bed Mobility Overal bed mobility: Needs Assistance Bed Mobility: Supine to Sit     Supine to sit: [facility] elevated;Min assist     General bed mobility comments: Encouragement for pt to perform as much as taks unassisted as she could. Assist for trunk and scoot to EOB. Increased time.   Transfers Overall transfer level: Needs assistance Equipment used: Rolling walker (2 wheeled);None Transfers: Sit to/from [facility] Sit to Stand: +2 physical assistance;+2 safety/equipment;Max assist Stand pivot transfers: Max assist;+2 physical assistance;+2 safety/equipment       General transfer comment: Max multimodal cueing for safety, technique, posture, placement of feet. Poor anterior  weightshifting. Heavy posterior lean with pt pushing into posterior direction due to fear of falling forward. Stand pivot from bed to recliner with extremely poor use of walker (flexed posture, pushing walker away). Stood a 2nd time from [facility] and switched out bsc for recliner. Pt has significant difficulty keeping her feet within a stable BOS (feet shoot out forwards).   Ambulation/Gait             General Gait Details: NT-pt too fearful of falling, safety reasons/high fall risk.    Stairs            Wheelchair Mobility    Modified Rankin (Stroke Patients Only)       Balance Overall balance assessment: Needs assistance Sitting-balance support: Bilateral upper extremity supported;Feet supported Sitting balance-Leahy Scale: Fair       Standing balance-Leahy Scale: Zero                              Cognition Arousal/Alertness: Awake/alert Behavior During Therapy: Anxious Overall Cognitive Status: Within Functional Limits for tasks assessed                                 General Comments: pt very fearful of falling-increased anxiety      Exercises      General Comments        Pertinent Vitals/Pain Pain Assessment: Faces Faces Pain Scale: Hurts little more Pain Location: shoulders, hands Pain Descriptors / Indicators: Sore;Aching;Discomfort Pain Intervention(s): Limited activity within patient's tolerance;Repositioned    Home Living  Prior Function            PT Goals (current goals can now be found in the care plan section) Progress towards PT goals: Progressing toward goals    Frequency    Min 2X/week      PT Plan Current plan remains appropriate    Co-evaluation              AM-PAC PT "6 Clicks" Daily Activity  Outcome Measure  Difficulty turning over in bed (including adjusting bedclothes, sheets and blankets)?: Unable Difficulty moving from lying on back to sitting on the  side of the bed? : Unable Difficulty sitting down on and standing up from a chair with arms (e.g., wheelchair, bedside commode, etc,.)?: Unable Help needed moving to and from a bed to chair (including a wheelchair)?: Total Help needed walking in hospital room?: Total Help needed climbing 3-5 steps with a railing? : Total 6 Click Score: 6    End of Session Equipment Utilized During Treatment: Gait belt Activity Tolerance: Patient limited by fatigue(limited by anxiety) Patient left: in chair;with call bell/phone within reach   PT Visit Diagnosis: Muscle weakness (generalized) (M62.81);Difficulty in walking, not elsewhere classified (R26.2)     Time: 3557-3220 PT Time Calculation (min) (ACUTE ONLY): 28 min  Charges:  $Therapeutic Activity: 23-37 mins                    G Codes:          Weston Anna, MPT Pager: [phone]

## 2017-05-17 NOTE — Progress Notes (Addendum)
[facility] PROGRESS NOTE    Progress Note  Jasmin Lee  ZOX:[id number] DOB: May 12, 1923 DOA: 05/12/2017 PCP: [name], MD     Brief Narrative:   GEARL Lee is an 83 y.o. female past medical history of chronic atrial fibrillation on Eliquis, COPD comes into the hospital complaining of shortness of breath that started 3 days prior to admission with a productive cough, and x-ray was done in the ED that showed possible pneumonia with a leukocytosis.  05/16/17: Patient seen. No history from patient. Will repeat CBC and Pro Calcitonin in am. Will pursue disposition if patient remains stable.  05/17/17 - Patient seen today alongside patient's care giver. Patient looks a lot better today. No SOB. Leukocytosis has resolved. No wheezing today. CXR revealed bilateral pleural effusion but patient's symptoms have improved significantly. Will pursue disposition (SNF as per PT).  Assessment/Plan:   Acute on chronic respiratory failure with hypoxia (HCC) - Resolved significantly. - Continue current management.  COPD with exacerbation - Continue current treatment  . CAP (community acquired pneumonia) - Complete course of antibiotics -  Leukocytosis has resolved -  No constitutional symptoms.  Speech therapy was consulted recommended a dysphagia III diet due to risk of aspiration.  PT recommended SNF   Pursue disposition.   Acute bacterial conjunctivitis with excessive purulent discharge: - Resolved - On Erythromycin eye ointment.  HypoKalemia: Resolved.  Chronic atrial fibrillation: - Rate controlled.  - continue Eliquis.    Essential hypertension: - Optimize  Protein-calorie malnutrition, moderate (HCC)   DVT prophylaxis: eluquis Family Communication:none Disposition Plan/Barrier to D/C: Hopefully skilled nursing facility placement.  Tomorrow  Code Status:     Code Status Orders  (From admission, onward)        Start     Ordered   05/13/17  0621  Do not attempt resuscitation (DNR)  Continuous    Question Answer Comment  In the event of cardiac or respiratory ARREST Do not call a "code blue"   In the event of cardiac or respiratory ARREST Do not perform Intubation, CPR, defibrillation or ACLS   In the event of cardiac or respiratory ARREST Use medication by any route, position, wound care, and other measures to relive pain and suffering. May use oxygen, suction and manual treatment of airway obstruction as needed for comfort.      05/13/17 0620    Code Status History    Date Active Date Inactive Code Status Order ID Comments User Context   11/24/2016 18:24 11/27/2016 15:51 DNR [id number]  Louellen Molder, MD Inpatient   10/05/2016 16:18 10/08/2016 20:51 Full Code [id number]  [name], PA-C ED   05/11/2014 15:22 05/12/2014 20:48 Full Code [id number]  [name], MD Inpatient   06/12/2013 02:28 06/13/2013 18:46 Partial Code [id number]  [name], PA-C Inpatient   06/11/2013 22:03 06/12/2013 02:28 Full Code [id number]  [name], MD Inpatient        IV Access:    Peripheral IV   Procedures and diagnostic studies:   Dg Chest Port 1 View  Result Date: 05/17/2017 CLINICAL DATA:  Shortness of breath.  Follow-up. EXAM: PORTABLE CHEST 1 VIEW COMPARISON:  05/17/2017 FINDINGS: Persistent bilateral effusions, questionably slightly larger. Persistent atelectasis in both lower lungs. Chronic atherosclerosis. Venous hypertension. IMPRESSION: Large bilateral effusions with dependent atelectasis, probably worsening over the last 4 days. Electronically Signed   By: Nelson Chimes M.D.   On: 05/17/2017 06:41     Medical Consultants:    None.  Anti-Infectives:   IV Levaquin  Subjective:   No new complaints. Quite communicative today. No SOB. No wheezing  Objective:    Vitals:   05/16/17 2020 05/16/17 2151 05/17/17 0555 05/17/17 0935  BP:  (!) 146/103 (!) 143/80   Pulse:  (!) 120 93   Resp:  14 16 16     Temp:  98.2 F (36.8 C) 97.8 F (36.6 C)   TempSrc:  Oral Oral   SpO2: 97% 95% 100% 99%  Weight:      Height:        Intake/Output Summary (Last 24 hours) at 05/17/2017 1219 Last data filed at 05/17/2017 0945 Gross per 24 hour  Intake 340 ml  Output 350 ml  Net -10 ml   Filed Weights   05/12/17 2315  Weight: 49.9 kg (110 lb)    Exam: General exam: In no acute distress. Respiratory system: Clear to auscultation anteriorly Cardiovascular system: S1 & S2 heard, RRR.  Gastrointestinal system: Abdomen is nondistended, soft and nontender.  Central nervous system: Alert and oriented. No focal neurological deficits. Extremities: No pedal edema.  Data Reviewed:    Labs: Basic Metabolic Panel: Recent Labs  Lab 05/12/17 2327 05/13/17 1220 05/15/17 0614 05/16/17 0627 05/17/17 0533  NA 130* 129* 130* 133* 131*  K 3.1* 3.7 4.1 3.7 3.8  CL 93* 94* 95* 97* 94*  CO2 27 27 27 29 28   GLUCOSE 114* 119* 154* 90 134*  BUN 15 15 17 18 19   CREATININE 0.74 0.68 0.70 0.75 0.73  CALCIUM 8.1* 7.6* 8.2* 8.1* 8.1*   GFR Estimated Creatinine Clearance: 31.5 mL/min (by C-Jasmin formula based on SCr of 0.73 mg/dL). Liver Function Tests: Recent Labs  Lab 05/13/17 1220  AST 36  ALT 24  ALKPHOS 99  BILITOT 0.9  PROT 4.6*  ALBUMIN 2.2*   No results for input(s): LIPASE, AMYLASE in the last 168 hours. No results for input(s): AMMONIA in the last 168 hours. Coagulation profile No results for input(s): INR, PROTIME in the last 168 hours.  CBC: Recent Labs  Lab 05/12/17 2327 05/13/17 1220 05/17/17 0533  WBC 16.2* 19.9* 9.3  NEUTROABS 13.9* 18.0* 7.6  HGB 11.1* 10.9* 11.4*  HCT 32.0* 32.1* 34.4*  MCV 82.5 82.9 84.3  PLT 301 298 390   Cardiac Enzymes: No results for input(s): CKTOTAL, CKMB, CKMBINDEX, TROPONINI in the last 168 hours. BNP (last 3 results) No results for input(s): PROBNP in the last 8760 hours. CBG: No results for input(s): GLUCAP in the last 168  hours. D-Dimer: No results for input(s): DDIMER in the last 72 hours. Hgb A1c: No results for input(s): HGBA1C in the last 72 hours. Lipid Profile: No results for input(s): CHOL, HDL, LDLCALC, TRIG, CHOLHDL, LDLDIRECT in the last 72 hours. Thyroid function studies: No results for input(s): TSH, T4TOTAL, T3FREE, THYROIDAB in the last 72 hours.  Invalid input(s): FREET3 Anemia work up: No results for input(s): VITAMINB12, FOLATE, FERRITIN, TIBC, IRON, RETICCTPCT in the last 72 hours. Sepsis Labs: Recent Labs  Lab 05/12/17 2327 05/12/17 2355 05/13/17 1220 05/16/17 1300 05/16/17 2346 05/17/17 0533  PROCALCITON  --   --   --  0.23 0.18  --   WBC 16.2*  --  19.9*  --   --  9.3  LATICACIDVEN  --  0.98  --   --   --   --    Microbiology Recent Results (from the past 240 hour(s))  Eye culture     Status: None   Collection Time:  05/13/17 12:11 AM  Result Value Ref Range Status   Specimen Description EYE EXUDATE  Final   Special Requests NONE  Final   Gram Stain NO WBC SEEN NO ORGANISMS SEEN   Final   Culture   Final    ABUNDANT HAEMOPHILUS INFLUENZAE BETA LACTAMASE NEGATIVE CRITICAL RESULT CALLED TO, READ BACK BY AND VERIFIED WITH: Glynn Octave, RN AT 1332 05/14/17 BY L BENFIELD Performed at [facility], [place] [address], [place], [place] [address]    Report Status 05/15/2017 FINAL  Final  Respiratory Panel by PCR     Status: None   Collection Time: 05/13/17  3:56 AM  Result Value Ref Range Status   Adenovirus NOT DETECTED NOT DETECTED Final   Coronavirus 229E NOT DETECTED NOT DETECTED Final   Coronavirus HKU1 NOT DETECTED NOT DETECTED Final   Coronavirus NL63 NOT DETECTED NOT DETECTED Final   Coronavirus OC43 NOT DETECTED NOT DETECTED Final   Metapneumovirus NOT DETECTED NOT DETECTED Final   Rhinovirus / Enterovirus NOT DETECTED NOT DETECTED Final   Influenza A NOT DETECTED NOT DETECTED Final   Influenza B NOT DETECTED NOT DETECTED Final   Parainfluenza Virus 1 NOT  DETECTED NOT DETECTED Final   Parainfluenza Virus 2 NOT DETECTED NOT DETECTED Final   Parainfluenza Virus 3 NOT DETECTED NOT DETECTED Final   Parainfluenza Virus 4 NOT DETECTED NOT DETECTED Final   Respiratory Syncytial Virus NOT DETECTED NOT DETECTED Final   Bordetella pertussis NOT DETECTED NOT DETECTED Final   Chlamydophila pneumoniae NOT DETECTED NOT DETECTED Final   Mycoplasma pneumoniae NOT DETECTED NOT DETECTED Final  Culture, blood (routine x 2) Call MD if unable to obtain prior to antibiotics being given     Status: None (Preliminary result)   Collection Time: 05/13/17 12:06 PM  Result Value Ref Range Status   Specimen Description BLOOD RIGHT ARM  Final   Special Requests   Final    BOTTLES DRAWN AEROBIC AND ANAEROBIC Blood Culture adequate volume   Culture   Final    NO GROWTH 4 DAYS Performed at [facility], [address], [place], [place] [address]    Report Status PENDING  Incomplete  Culture, blood (routine x 2) Call MD if unable to obtain prior to antibiotics being given     Status: None (Preliminary result)   Collection Time: 05/13/17 12:20 PM  Result Value Ref Range Status   Specimen Description BLOOD RIGHT ANTECUBITAL  Final   Special Requests   Final    BOTTLES DRAWN AEROBIC AND ANAEROBIC Blood Culture adequate volume   Culture   Final    NO GROWTH 4 DAYS Performed at [facility], [place] [address], [place], [place] [address]    Report Status PENDING  Incomplete     Medications:   . acetaminophen  1,000 mg Oral TID  . apixaban  2.5 mg Oral BID  . atorvastatin  10 mg Oral QODAY  . chlorhexidine  15 mL Mouth Rinse BID  . cholecalciferol  1,000 Units Oral Daily  . diltiazem  180 mg Oral Daily  . erythromycin   Both Eyes Q6H  . furosemide  20 mg Intravenous Once  . furosemide  20 mg Oral Daily  . hydrALAZINE  10 mg Oral Q8H  . ipratropium-albuterol  3 mL Nebulization TID  . levofloxacin  750 mg Oral Q48H  . levothyroxine  25 mcg Oral QAC  breakfast  . mouth rinse  15 mL Mouth Rinse q12n4p  .  mometasone-formoterol  2 puff Inhalation BID  . montelukast  10 mg Oral QHS  . multivitamin with minerals  1 tablet Oral Q breakfast  . pantoprazole  40 mg Oral Daily  . polyvinyl alcohol  1 drop Both Eyes TID  . predniSONE  40 mg Oral Q breakfast  . cyanocobalamin  1,000 mcg Oral Q breakfast   Continuous Infusions:     LOS: 4 days   [facility] Pager [phone]  *Please refer to [email or web address], password [email or web address] to get updated schedule on who will round on this patient, as hospitalists switch teams weekly. If 7PM-7AM, please contact night-coverage at [email or web address], password [email or web address] for any overnight needs.  05/17/2017, 12:19 PM

## 2017-05-18 LAB — BASIC METABOLIC PANEL
ANION GAP: 7 (ref 5–15)
BUN: 18 mg/dL (ref 6–20)
CALCIUM: 7.9 mg/dL — AB (ref 8.9–10.3)
CO2: 30 mmol/L (ref 22–32)
Chloride: 95 mmol/L — ABNORMAL LOW (ref 101–111)
Creatinine, Ser: 0.71 mg/dL (ref 0.44–1.00)
Glucose, Bld: 138 mg/dL — ABNORMAL HIGH (ref 65–99)
POTASSIUM: 3.5 mmol/L (ref 3.5–5.1)
Sodium: 132 mmol/L — ABNORMAL LOW (ref 135–145)

## 2017-05-18 LAB — CULTURE, BLOOD (ROUTINE X 2)
Culture: NO GROWTH
Culture: NO GROWTH
SPECIAL REQUESTS: ADEQUATE
Special Requests: ADEQUATE

## 2017-05-18 MED ORDER — GUAIFENESIN-DM 100-10 MG/5ML PO SYRP: 5.0000 mL | ORAL_SOLUTION | ORAL | Status: DC | PRN

## 2017-05-18 NOTE — Progress Notes (Signed)
PROGRESS NOTE    Jasmin Lee  UJW:[id number] DOB: 06/27/1922 DOA: 05/12/2017 PCP: [name], MD    Brief Narrative: Jasmin Lee is an 83 y.o. female past medical history of chronic atrial fibrillation on Eliquis, COPD comes into the hospital complaining of shortness of breath that started 3 days prior to admission with a productive cough, and x-ray was done in the ED that showed possible pneumonia with a leukocytosis.     Assessment & Plan:   Active Problems:   Essential hypertension   COPD (chronic obstructive pulmonary disease) (HCC)   Hypokalemia   Atrial fibrillation, chronic (HCC)   Protein-calorie malnutrition, moderate (HCC)   CAP (community acquired pneumonia)   Acute on chronic respiratory failure with hypoxia (HCC)   Pressure injury of skin   Acute on chronic respiratory failure with hypoxia secondary to CAP and COPD exacerbation:  Complete the course of antibiotics with levaquin. .  Leukocytosis resolved.  SLP consulted and recommendations given.  PT eval recommending SNF.    Acute bacterial conjunctivitis: Resolved. On erythromycin eye ointment.    Hypokalemia: replaced.    Chronic atrial fibrillation:  Rate controlled with eliquis.    Hypertension:  Well controlled.    Mod protein calorie malnutrition:  Dietary consulted.     DVT prophylaxis: eliquis Code Status: DNR.  Family Communication: none at bedside.  Disposition Plan: SNF tomorrow or when bed available.    Consultants:   None.    Procedures: none.    Antimicrobials: levaquin.    Subjective: No new complaints.   Objective: Vitals:   05/18/17 0408 05/18/17 0832 05/18/17 1358 05/18/17 1419  BP: (!) 148/77   133/61  Pulse: 94   89  Resp: 16   18  Temp: 98.2 F (36.8 C)   98.4 F (36.9 C)  TempSrc: Oral   Oral  SpO2: 99% 97% 98% 97%  Weight:      Height:        Intake/Output Summary (Last 24 hours) at 05/18/2017 1614 Last data filed at  05/18/2017 1300 Gross per 24 hour  Intake 450 ml  Output -  Net 450 ml   Filed Weights   05/12/17 2315  Weight: 49.9 kg (110 lb)    Examination:  General exam: Appears calm and comfortable  Respiratory system: Clear to auscultation. Respiratory effort normal. Cardiovascular system: S1 & S2 heard, RRR. No JVD, murmurs, rubs, gallops or clicks. No pedal edema. Gastrointestinal system: Abdomen is nondistended, soft and nontender. No organomegaly or masses felt. Normal bowel sounds heard. Central nervous system: Alert and oriented. No focal neurological deficits. Extremities: Symmetric 5 x 5 power. Skin: No rashes, lesions or ulcers Psychiatry: Judgement and insight appear normal. Mood & affect appropriate.     Data Reviewed: I have personally reviewed following labs and imaging studies  CBC: Recent Labs  Lab 05/12/17 2327 05/13/17 1220 05/17/17 0533  WBC 16.2* 19.9* 9.3  NEUTROABS 13.9* 18.0* 7.6  HGB 11.1* 10.9* 11.4*  HCT 32.0* 32.1* 34.4*  MCV 82.5 82.9 84.3  PLT 301 298 562   Basic Metabolic Panel: Recent Labs  Lab 05/13/17 1220 05/15/17 0614 05/16/17 0627 05/17/17 0533 05/18/17 0524  NA 129* 130* 133* 131* 132*  K 3.7 4.1 3.7 3.8 3.5  CL 94* 95* 97* 94* 95*  CO2 27 27 29 28 30   GLUCOSE 119* 154* 90 134* 138*  BUN 15 17 18 19 18   CREATININE 0.68 0.70 0.75 0.73 0.71  CALCIUM 7.6* 8.2* 8.1* 8.1* 7.9*  GFR: Estimated Creatinine Clearance: 31.5 mL/min (by C-G formula based on SCr of 0.71 mg/dL). Liver Function Tests: Recent Labs  Lab 05/13/17 1220  AST 36  ALT 24  ALKPHOS 99  BILITOT 0.9  PROT 4.6*  ALBUMIN 2.2*   No results for input(s): LIPASE, AMYLASE in the last 168 hours. No results for input(s): AMMONIA in the last 168 hours. Coagulation Profile: No results for input(s): INR, PROTIME in the last 168 hours. Cardiac Enzymes: No results for input(s): CKTOTAL, CKMB, CKMBINDEX, TROPONINI in the last 168 hours. BNP (last 3 results) No results  for input(s): PROBNP in the last 8760 hours. HbA1C: No results for input(s): HGBA1C in the last 72 hours. CBG: No results for input(s): GLUCAP in the last 168 hours. Lipid Profile: No results for input(s): CHOL, HDL, LDLCALC, TRIG, CHOLHDL, LDLDIRECT in the last 72 hours. Thyroid Function Tests: No results for input(s): TSH, T4TOTAL, FREET4, T3FREE, THYROIDAB in the last 72 hours. Anemia Panel: No results for input(s): VITAMINB12, FOLATE, FERRITIN, TIBC, IRON, RETICCTPCT in the last 72 hours. Sepsis Labs: Recent Labs  Lab 05/12/17 2355 05/16/17 1300 05/16/17 2346  PROCALCITON  --  0.23 0.18  LATICACIDVEN 0.98  --   --     Recent Results (from the past 240 hour(s))  Eye culture     Status: None   Collection Time: 05/13/17 12:11 AM  Result Value Ref Range Status   Specimen Description EYE EXUDATE  Final   Special Requests NONE  Final   Gram Stain NO WBC SEEN NO ORGANISMS SEEN   Final   Culture   Final    ABUNDANT HAEMOPHILUS INFLUENZAE BETA LACTAMASE NEGATIVE CRITICAL RESULT CALLED TO, READ BACK BY AND VERIFIED WITH: Glynn Octave, RN AT 1332 05/14/17 BY L BENFIELD Performed at [facility], [place] [address] [place], [place] [address]    Report Status 05/15/2017 FINAL  Final  Respiratory Panel by PCR     Status: None   Collection Time: 05/13/17  3:56 AM  Result Value Ref Range Status   Adenovirus NOT DETECTED NOT DETECTED Final   Coronavirus 229E NOT DETECTED NOT DETECTED Final   Coronavirus HKU1 NOT DETECTED NOT DETECTED Final   Coronavirus NL63 NOT DETECTED NOT DETECTED Final   Coronavirus OC43 NOT DETECTED NOT DETECTED Final   Metapneumovirus NOT DETECTED NOT DETECTED Final   Rhinovirus / Enterovirus NOT DETECTED NOT DETECTED Final   Influenza A NOT DETECTED NOT DETECTED Final   Influenza B NOT DETECTED NOT DETECTED Final   Parainfluenza Virus 1 NOT DETECTED NOT DETECTED Final   Parainfluenza Virus 2 NOT DETECTED NOT DETECTED Final   Parainfluenza Virus 3 NOT  DETECTED NOT DETECTED Final   Parainfluenza Virus 4 NOT DETECTED NOT DETECTED Final   Respiratory Syncytial Virus NOT DETECTED NOT DETECTED Final   Bordetella pertussis NOT DETECTED NOT DETECTED Final   Chlamydophila pneumoniae NOT DETECTED NOT DETECTED Final   Mycoplasma pneumoniae NOT DETECTED NOT DETECTED Final  Culture, blood (routine x 2) Call MD if unable to obtain prior to antibiotics being given     Status: None   Collection Time: 05/13/17 12:06 PM  Result Value Ref Range Status   Specimen Description BLOOD RIGHT ARM  Final   Special Requests   Final    BOTTLES DRAWN AEROBIC AND ANAEROBIC Blood Culture adequate volume   Culture   Final    NO GROWTH 5 DAYS Performed at [facility], [address] [place], [place] [address]  Report Status 05/18/2017 FINAL  Final  Culture, blood (routine x 2) Call MD if unable to obtain prior to antibiotics being given     Status: None   Collection Time: 05/13/17 12:20 PM  Result Value Ref Range Status   Specimen Description BLOOD RIGHT ANTECUBITAL  Final   Special Requests   Final    BOTTLES DRAWN AEROBIC AND ANAEROBIC Blood Culture adequate volume   Culture   Final    NO GROWTH 5 DAYS Performed at [facility], [address], [place], [place] [address]    Report Status 05/18/2017 FINAL  Final         Radiology Studies: Dg Chest Port 1 View  Result Date: 05/17/2017 CLINICAL DATA:  Shortness of breath.  Follow-up. EXAM: PORTABLE CHEST 1 VIEW COMPARISON:  05/17/2017 FINDINGS: Persistent bilateral effusions, questionably slightly larger. Persistent atelectasis in both lower lungs. Chronic atherosclerosis. Venous hypertension. IMPRESSION: Large bilateral effusions with dependent atelectasis, probably worsening over the last 4 days. Electronically Signed   By: Nelson Chimes M.D.   On: 05/17/2017 06:41        Scheduled Meds: . acetaminophen  1,000 mg Oral TID  . apixaban  2.5 mg Oral BID  . atorvastatin  10 mg Oral  QODAY  . chlorhexidine  15 mL Mouth Rinse BID  . cholecalciferol  1,000 Units Oral Daily  . diltiazem  180 mg Oral Daily  . erythromycin   Both Eyes Q6H  . furosemide  20 mg Intravenous Once  . furosemide  20 mg Oral Daily  . hydrALAZINE  10 mg Oral Q8H  . ipratropium-albuterol  3 mL Nebulization TID  . levofloxacin  750 mg Oral Q48H  . levothyroxine  25 mcg Oral QAC breakfast  . mouth rinse  15 mL Mouth Rinse q12n4p  . mometasone-formoterol  2 puff Inhalation BID  . montelukast  10 mg Oral QHS  . multivitamin with minerals  1 tablet Oral Q breakfast  . pantoprazole  40 mg Oral Daily  . polyvinyl alcohol  1 drop Both Eyes TID  . predniSONE  40 mg Oral Q breakfast  . cyanocobalamin  1,000 mcg Oral Q breakfast   Continuous Infusions:   LOS: 5 days    Time spent: 35 minutes.     Hosie Poisson, MD [facility] Pager [phone]  If 7PM-7AM, please contact night-coverage [email or web address] Password [email or web address] 05/18/2017, 4:14 PM

## 2017-05-19 DIAGNOSIS — J181 Lobar pneumonia, unspecified organism: Secondary | ICD-10-CM

## 2017-05-19 MED ORDER — ONDANSETRON HCL 4 MG/2ML IJ SOLN
4.0000 mg | Freq: Four times a day (QID) | INTRAMUSCULAR | Status: DC | PRN
  Administered 2017-05-19 (×2): 4 mg via INTRAVENOUS
  Filled 2017-05-19 (×2): qty 2

## 2017-05-19 MED ORDER — LEVOFLOXACIN 750 MG PO TABS: 750.0000 mg | ORAL_TABLET | ORAL | 0 refills | Status: DC

## 2017-05-19 MED ORDER — PROCHLORPERAZINE EDISYLATE 5 MG/ML IJ SOLN
5.0000 mg | Freq: Once | INTRAMUSCULAR | Status: AC
  Administered 2017-05-19: 5 mg via INTRAVENOUS
  Filled 2017-05-19: qty 2

## 2017-05-19 MED ORDER — ENSURE ENLIVE PO LIQD: 237.0000 mL | Freq: Three times a day (TID) | ORAL | 12 refills | Status: AC

## 2017-05-19 MED ORDER — LEVOTHYROXINE SODIUM 25 MCG PO TABS: 25.0000 ug | ORAL_TABLET | Freq: Every day | ORAL | 0 refills | Status: AC

## 2017-05-19 MED ORDER — ENSURE ENLIVE PO LIQD
237.0000 mL | Freq: Three times a day (TID) | ORAL | Status: DC
  Administered 2017-05-19 (×2): 237 mL via ORAL

## 2017-05-19 NOTE — Progress Notes (Signed)
PROGRESS NOTE    Jasmin Lee  IEP:[id number] DOB: [birth year] DOA: 05/12/2017 PCP: [name], MD  Brief Narrative:83 y.o.femalepast medical history of chronic atrial fibrillation on Eliquis, COPD comes into the hospital complaining of shortness of breath that started 3 days prior to admission with a productive cough, and x-ray was done in the ED that showed possible pneumonia with a leukocytosis.      Assessment & Plan:   Active Problems:   Essential hypertension   COPD (chronic obstructive pulmonary disease) (HCC)   Hypokalemia   Atrial fibrillation, chronic (HCC)   Protein-calorie malnutrition, moderate (HCC)   CAP (community acquired pneumonia)   Acute on chronic respiratory failure with hypoxia (HCC)   Pressure injury of skin  Acute on chronic respiratory failure with hypoxia secondary to CAP and COPD exacerbation:  Complete the course of antibiotics with levaquin. .  Leukocytosis resolved.  SLP consulted and recommendations given.  PT eval recommending SNF.    Acute bacterial conjunctivitis: Resolved. On erythromycin eye ointment.    Hypokalemia: replaced.    Chronic atrial fibrillation:  Rate controlled with eliquis.    Hypertension:  Well controlled.    Mod protein calorie malnutrition:  Dietary consulted.         DVT prophylaxis:eliquis Code Status: Family Commundnrication:none  Disposition Plan to snf when bed available Consultants:  none Procedures: none Antimicrobials:  none Subjective:awake no complaints  Objective:resting in bed in nad Vitals:   05/19/17 0456 05/19/17 0730 05/19/17 0747 05/19/17 0750  BP: 139/71     Pulse: 96     Resp: 16     Temp: 97.7 F (36.5 C)     TempSrc: Oral     SpO2: 100% 99% 99% 99%  Weight:      Height:        Intake/Output Summary (Last 24 hours) at 05/19/2017 0751 Last data filed at 05/19/2017 0700 Gross per 24 hour  Intake 690 ml  Output 790 ml  Net -100 ml    Filed Weights   05/12/17 2315  Weight: 49.9 kg (110 lb)    Examination:  General exam: Appears calm and comfortable  Respiratory system: Clear to auscultation. Respiratory effort normal. Cardiovascular system: S1 & S2 heard, RRR. No JVD, murmurs, rubs, gallops or clicks. No pedal edema. Gastrointestinal system: Abdomen is nondistended, soft and nontender. No organomegaly or masses felt. Normal bowel sounds heard. Central nervous system: Alert and oriented. No focal neurological deficits. Extremities: Symmetric 5 x 5 power. Skin: No rashes, lesions or ulcers Psychiatry: Judgement and insight appear normal. Mood & affect appropriate.     Data Reviewed: I have personally reviewed following labs and imaging studies  CBC: Recent Labs  Lab 05/12/17 2327 05/13/17 1220 05/17/17 0533  WBC 16.2* 19.9* 9.3  NEUTROABS 13.9* 18.0* 7.6  HGB 11.1* 10.9* 11.4*  HCT 32.0* 32.1* 34.4*  MCV 82.5 82.9 84.3  PLT 301 298 660   Basic Metabolic Panel: Recent Labs  Lab 05/13/17 1220 05/15/17 0614 05/16/17 0627 05/17/17 0533 05/18/17 0524  NA 129* 130* 133* 131* 132*  K 3.7 4.1 3.7 3.8 3.5  CL 94* 95* 97* 94* 95*  CO2 27 27 29 28 30   GLUCOSE 119* 154* 90 134* 138*  BUN 15 17 18 19 18   CREATININE 0.68 0.70 0.75 0.73 0.71  CALCIUM 7.6* 8.2* 8.1* 8.1* 7.9*   GFR: Estimated Creatinine Clearance: 31.5 mL/min (by C-G formula based on SCr of 0.71 mg/dL). Liver Function Tests: Recent Labs  Lab 05/13/17 1220  AST 36  ALT 24  ALKPHOS 99  BILITOT 0.9  PROT 4.6*  ALBUMIN 2.2*   No results for input(s): LIPASE, AMYLASE in the last 168 hours. No results for input(s): AMMONIA in the last 168 hours. Coagulation Profile: No results for input(s): INR, PROTIME in the last 168 hours. Cardiac Enzymes: No results for input(s): CKTOTAL, CKMB, CKMBINDEX, TROPONINI in the last 168 hours. BNP (last 3 results) No results for input(s): PROBNP in the last 8760 hours. HbA1C: No results for  input(s): HGBA1C in the last 72 hours. CBG: No results for input(s): GLUCAP in the last 168 hours. Lipid Profile: No results for input(s): CHOL, HDL, LDLCALC, TRIG, CHOLHDL, LDLDIRECT in the last 72 hours. Thyroid Function Tests: No results for input(s): TSH, T4TOTAL, FREET4, T3FREE, THYROIDAB in the last 72 hours. Anemia Panel: No results for input(s): VITAMINB12, FOLATE, FERRITIN, TIBC, IRON, RETICCTPCT in the last 72 hours. Sepsis Labs: Recent Labs  Lab 05/12/17 2355 05/16/17 1300 05/16/17 2346  PROCALCITON  --  0.23 0.18  LATICACIDVEN 0.98  --   --     Recent Results (from the past 240 hour(s))  Eye culture     Status: None   Collection Time: 05/13/17 12:11 AM  Result Value Ref Range Status   Specimen Description EYE EXUDATE  Final   Special Requests NONE  Final   Gram Stain NO WBC SEEN NO ORGANISMS SEEN   Final   Culture   Final    ABUNDANT HAEMOPHILUS INFLUENZAE BETA LACTAMASE NEGATIVE CRITICAL RESULT CALLED TO, READ BACK BY AND VERIFIED WITH: Glynn Octave, RN AT 1332 05/14/17 BY L BENFIELD Performed at [facility], [place] [address] [place], [place] [address]    Report Status 05/15/2017 FINAL  Final  Respiratory Panel by PCR     Status: None   Collection Time: 05/13/17  3:56 AM  Result Value Ref Range Status   Adenovirus NOT DETECTED NOT DETECTED Final   Coronavirus 229E NOT DETECTED NOT DETECTED Final   Coronavirus HKU1 NOT DETECTED NOT DETECTED Final   Coronavirus NL63 NOT DETECTED NOT DETECTED Final   Coronavirus OC43 NOT DETECTED NOT DETECTED Final   Metapneumovirus NOT DETECTED NOT DETECTED Final   Rhinovirus / Enterovirus NOT DETECTED NOT DETECTED Final   Influenza A NOT DETECTED NOT DETECTED Final   Influenza B NOT DETECTED NOT DETECTED Final   Parainfluenza Virus 1 NOT DETECTED NOT DETECTED Final   Parainfluenza Virus 2 NOT DETECTED NOT DETECTED Final   Parainfluenza Virus 3 NOT DETECTED NOT DETECTED Final   Parainfluenza Virus 4 NOT DETECTED NOT  DETECTED Final   Respiratory Syncytial Virus NOT DETECTED NOT DETECTED Final   Bordetella pertussis NOT DETECTED NOT DETECTED Final   Chlamydophila pneumoniae NOT DETECTED NOT DETECTED Final   Mycoplasma pneumoniae NOT DETECTED NOT DETECTED Final  Culture, blood (routine x 2) Call MD if unable to obtain prior to antibiotics being given     Status: None   Collection Time: 05/13/17 12:06 PM  Result Value Ref Range Status   Specimen Description BLOOD RIGHT ARM  Final   Special Requests   Final    BOTTLES DRAWN AEROBIC AND ANAEROBIC Blood Culture adequate volume   Culture   Final    NO GROWTH 5 DAYS Performed at [facility], [address] [place], [place] [address]    Report Status 05/18/2017 FINAL  Final  Culture, blood (routine x 2) Call MD if unable to obtain prior to antibiotics being given  Status: None   Collection Time: 05/13/17 12:20 PM  Result Value Ref Range Status   Specimen Description BLOOD RIGHT ANTECUBITAL  Final   Special Requests   Final    BOTTLES DRAWN AEROBIC AND ANAEROBIC Blood Culture adequate volume   Culture   Final    NO GROWTH 5 DAYS Performed at [facility], [address], [place], [place] [address]    Report Status 05/18/2017 FINAL  Final         Radiology Studies: No results found.      Scheduled Meds: . acetaminophen  1,000 mg Oral TID  . apixaban  2.5 mg Oral BID  . atorvastatin  10 mg Oral QODAY  . chlorhexidine  15 mL Mouth Rinse BID  . cholecalciferol  1,000 Units Oral Daily  . diltiazem  180 mg Oral Daily  . erythromycin   Both Eyes Q6H  . furosemide  20 mg Intravenous Once  . furosemide  20 mg Oral Daily  . hydrALAZINE  10 mg Oral Q8H  . ipratropium-albuterol  3 mL Nebulization TID  . levofloxacin  750 mg Oral Q48H  . levothyroxine  25 mcg Oral QAC breakfast  . mouth rinse  15 mL Mouth Rinse q12n4p  . mometasone-formoterol  2 puff Inhalation BID  . montelukast  10 mg Oral QHS  . multivitamin with minerals  1  tablet Oral Q breakfast  . pantoprazole  40 mg Oral Daily  . polyvinyl alcohol  1 drop Both Eyes TID  . predniSONE  40 mg Oral Q breakfast  . cyanocobalamin  1,000 mcg Oral Q breakfast   Continuous Infusions:   LOS: 6 days       Georgette Shell, MD [facility]  If 7PM-7AM, please contact night-coverage [email or web address] Password [facility] 05/19/2017, 7:51 AM

## 2017-05-19 NOTE — Discharge Summary (Signed)
Physician Discharge Summary  ADRIA COSTLEY JSE:[id number] DOB: 04-Oct-1922 DOA: 05/12/2017  PCP: [name], MD  Admit date: 05/12/2017 Discharge date: 05/19/2017  Admitted From:home  Disposition:snf Recommendations for Outpatient Follow-up:  1. Follow up with PCP in 1-2 weeks 2. Please obtain BMP/CBC in one week Home Health:none Equipment/Devicesnone Discharge Condition:stable CODE STATUS dnr Diet recommendation:cardiac Brief/Interim Summary: : :83 y.o.femalepast medical history of chronic atrial fibrillation on Eliquis, COPD comes into the hospital complaining of shortness of breath that started 3 days prior to admission with a productive cough, and x-ray was done in the ED that showed possible pneumonia with a leukocytosis.    Active Problems:   Essential hypertension   COPD (chronic obstructive pulmonary disease) (HCC)   Hypokalemia   Atrial fibrillation, chronic (HCC)   Protein-calorie malnutrition, moderate (HCC)   CAP (community acquired pneumonia)   Acute on chronic respiratory failure with hypoxia (HCC)   Pressure injury of skin  Acute on chronic respiratory failure with hypoxia secondary to CAP and COPD exacerbation:  Complete the course of antibiotics with levaquin. .  Leukocytosis resolved.  SLP consulted and recommendations given.  PT eval recommending SNF.    Acute bacterial conjunctivitis: Resolved. On erythromycin eye ointment.    Hypokalemia: replaced.    Chronic atrial fibrillation:  Rate controlled with eliquis.    Hypertension:  Well controlled.    Mod protein calorie malnutrition:  Dietary consulted.    Discharge Instructions   Allergies as of 05/19/2017      Reactions   Almond (diagnostic) Shortness Of Breath   Asa [aspirin] Shortness Of Breath, Other (See Comments)   On high doses of ASA   Coconut Flavor Shortness Of Breath   Neomycin Shortness Of Breath   Peanut-containing Drug Products Shortness Of  Breath   Penicillins Shortness Of Breath, Other (See Comments)   Has patient had a PCN reaction causing immediate rash, facial/tongue/throat swelling, SOB or lightheadedness with hypotension: Yes Has patient had a PCN reaction causing severe rash involving mucus membranes or skin necrosis: No Has patient had a PCN reaction that required hospitalization No Has patient had a PCN reaction occurring within the last 10 years: No If all of the above answers are "NO", then may proceed with Cephalosporin use.   Sulfa Antibiotics Shortness Of Breath   Tizanidine Shortness Of Breath   Lisinopril Cough   Keflex [cephalexin] Rash      Medication List    TAKE these medications   acetaminophen 500 MG tablet Commonly known as:  TYLENOL Take 1,000 mg by mouth 3 (three) times daily as needed for mild pain, moderate pain, fever or headache.   ADVAIR DISKUS 250-50 MCG/DOSE Aepb Generic drug:  Fluticasone-Salmeterol Inhale 2 puffs into the lungs 2 (two) times daily.   albuterol 108 (90 Base) MCG/ACT inhaler Commonly known as:  PROVENTIL HFA;VENTOLIN HFA Inhale 2 puffs into the lungs every 6 (six) hours as needed for wheezing or shortness of breath. What changed:  Another medication with the same name was removed. Continue taking this medication, and follow the directions you see here.   apixaban 2.5 MG Tabs tablet Commonly known as:  ELIQUIS Take 1 tablet (2.5 mg total) by mouth 2 (two) times daily. What changed:  Another medication with the same name was removed. Continue taking this medication, and follow the directions you see here.   ARTIFICIAL TEARS 1.4 % ophthalmic solution Generic drug:  polyvinyl alcohol Place 1 drop into both eyes 3 (three) times daily.   atorvastatin 10  MG tablet Commonly known as:  LIPITOR Take 10 mg by mouth every other day.   BIOFREEZE 4 % Gel Generic drug:  Menthol (Topical Analgesic) Apply 1 application topically 3 (three) times daily.    CALCIUM-MAGNESIUM-ZINC PO Take 1 tablet by mouth daily.   Cholecalciferol 1000 units tablet Take 1,000 Units by mouth daily.   cyanocobalamin 1000 MCG tablet Take 1,000 mcg by mouth daily with breakfast.   diltiazem 180 MG 24 hr capsule Commonly known as:  CARDIZEM CD Take 1 capsule (180 mg total) by mouth daily.   feeding supplement (ENSURE ENLIVE) Liqd Take 237 mLs by mouth 3 (three) times daily between meals.   fluticasone 50 MCG/ACT nasal spray Commonly known as:  FLONASE Place 2 sprays into both nostrils 2 (two) times daily.   furosemide 20 MG tablet Commonly known as:  LASIX Take 20 mg by mouth. Monday, Wednesday, Friday, and Saturday.   hydrALAZINE 10 MG tablet Commonly known as:  APRESOLINE TAKE 5 TABLETS DAILY (2 TABLETS IN THE MORNING, 1 TABLET IN THE AFTERNOON AND 2 TABLETS IN THE EVENING)   levofloxacin 750 MG tablet Commonly known as:  LEVAQUIN Take 1 tablet (750 mg total) by mouth every other day.   levothyroxine 25 MCG tablet Commonly known as:  SYNTHROID, LEVOTHROID Take 1 tablet (25 mcg total) by mouth daily before breakfast. Start taking on:  05/20/2017   montelukast 10 MG tablet Commonly known as:  SINGULAIR Take 10 mg by mouth at bedtime.   multivitamin with minerals Tabs tablet Take 1 tablet by mouth daily with breakfast.   OCUVITE-LUTEIN PO Take 1 tablet by mouth daily.   pantoprazole 40 MG tablet Commonly known as:  PROTONIX Take 1 tablet (40 mg total) by mouth daily.   phenazopyridine 200 MG tablet Commonly known as:  PYRIDIUM Take 200 mg by mouth as needed for pain.   polyethylene glycol packet Commonly known as:  MIRALAX / GLYCOLAX Take 17 g by mouth daily as needed for mild constipation.   tiZANidine 2 MG tablet Commonly known as:  ZANAFLEX Take 2 mg by mouth 2 (two) times daily.   traMADol 50 MG tablet Commonly known as:  ULTRAM Take 1 tablet (50 mg total) by mouth every 6 (six) hours as needed for moderate pain.        Allergies  Allergen Reactions  . Almond (Diagnostic) Shortness Of Breath  . Asa [Aspirin] Shortness Of Breath and Other (See Comments)    On high doses of ASA  . Coconut Flavor Shortness Of Breath  . Neomycin Shortness Of Breath  . Peanut-Containing Drug Products Shortness Of Breath  . Penicillins Shortness Of Breath and Other (See Comments)    Has patient had a PCN reaction causing immediate rash, facial/tongue/throat swelling, SOB or lightheadedness with hypotension: Yes Has patient had a PCN reaction causing severe rash involving mucus membranes or skin necrosis: No Has patient had a PCN reaction that required hospitalization No Has patient had a PCN reaction occurring within the last 10 years: No If all of the above answers are "NO", then may proceed with Cephalosporin use.   . Sulfa Antibiotics Shortness Of Breath  . Tizanidine Shortness Of Breath  . Lisinopril Cough  . Keflex [Cephalexin] Rash    Consultations none  Procedures/Studies: Dg Chest 2 View  Result Date: 05/13/2017 CLINICAL DATA:  Cough and shortness of breath EXAM: CHEST  2 VIEW COMPARISON:  Chest radiograph 05/11/2014 FINDINGS: There is shallow lung inflation with small bilateral pleural effusions and  associated atelectasis. Small area of consolidation in the left lower lobe. Calcific aortic atherosclerosis. IMPRESSION: Small bilateral pleural effusions with associated atelectasis and possible left lower lobe consolidation. The Aortic Atherosclerosis (ICD10-I70.0). Electronically Signed   By: Ulyses Jarred M.D.   On: 05/13/2017 00:28   Dg Chest Port 1 View  Result Date: 05/17/2017 CLINICAL DATA:  Shortness of breath.  Follow-up. EXAM: PORTABLE CHEST 1 VIEW COMPARISON:  05/17/2017 FINDINGS: Persistent bilateral effusions, questionably slightly larger. Persistent atelectasis in both lower lungs. Chronic atherosclerosis. Venous hypertension. IMPRESSION: Large bilateral effusions with dependent atelectasis,  probably worsening over the last 4 days. Electronically Signed   By: Nelson Chimes M.D.   On: 05/17/2017 06:41   Dg Swallowing Func-speech Pathology  Result Date: 05/14/2017 Objective Swallowing Evaluation: Type of Study: MBS-Modified Barium Swallow Study  Patient Details Name: JOHNELLE TAFOLLA MRN: [id number] Date of Birth: [birth year] Today's Date: 05/14/2017 Time: SLP Start Time (ACUTE ONLY): 1315 -SLP Stop Time (ACUTE ONLY): 1330 SLP Time Calculation (min) (ACUTE ONLY): 15 min Past Medical History: Past Medical History: Diagnosis Date . Anemia  . Aortic atherosclerosis ([place]) 2015  CTA Chest: Diffuse atherosclerosis of the aorta, great vessels and coronary arteries . Arthritis   "in my legs and feet" (05/11/2014) . Cardiomyopathy ([place])  . Chronic bronchitis ([place] "2-3 times/yr" . Complication of anesthesia   "started flinching at start of hysterectomy; they had to give me more anesthetic" . COPD with asthma ([place])  . Dyslipidemia  . Echocardiogram abnormal 12/05/2010  Moderate concentric left ventricular hypertrophy EF > 17% stage I diastolic dysfunction, left atrium mild to moderate dilatation, moderate mitral annular calcification with trace MR. Trace TR. Marland Kitchen HTN (hypertension)  . Hyponatremia 11/2016 . Pneumonia "several times" . Squamous carcinoma   "left face and thigh" . Uterine cancer [facility])  Past Surgical History: Past Surgical History: Procedure Laterality Date . APPENDECTOMY  1954 . BREAST BIOPSY Left   "it was nothing" . CARDIAC EVENT MONITOR  12/05/10-01/02/11  SINUS RHYTHM . CATARACT EXTRACTION W/ INTRAOCULAR LENS  IMPLANT, BILATERAL Bilateral  . CHOLECYSTECTOMY  2003 . DILATION AND CURETTAGE OF UTERUS  1966 . SQUAMOUS CELL CARCINOMA EXCISION Left   face and thigh . TRANSTHORACIC ECHOCARDIOGRAM  04/2014   Normal LV size and function Hyperdynamic left ventricle.Moderate concentric LVH with impaired relaxation.  Mild aortic stenosis . VAGINAL HYSTERECTOMY  1966 HPI: BRODY KUMP a 83 y.o.femalewith  medical history significant for atrial fibrillation, COPD, hyperlipidemia, hypothyroidism, pna, asthma came to the hospital with complains of shortness of breath and because "I couldn't see" (per pt- have conjunctivitis?). Chest x-ray 12/20 Small bilateral pleural effusions with associated atelectasis and possible left lower lobe consolidation. Affirms coughing during meals "sometimes".  Subjective: alert, communicative Assessment / Plan / Recommendation CHL IP CLINICAL IMPRESSIONS 05/14/2017 Clinical Impression Pt presents with functional oropharyngeal swallow with age-related changes considered to be normal.  There is mildly discoordinated oral preparation of boluses; pharyngeal swallow triggers at level of pyriform sinuses; there is no residue remaining in pharynx post-swallow.  During initial swallow of thin liquids, there may have been an incident of trace aspiration, but the fluoroscopy was shut off prematurely - aspiration could not be verified. However, during all subsequent swallows, pt closed laryngeal vestibule reliably and neither penetrated nor aspirated any materials.  Recommend resuming a regular diet, thin liquids.  No further SLP needs are identified.  SLP Visit Diagnosis Dysphagia, unspecified (R13.10) Attention and concentration deficit following -- Frontal lobe and executive function deficit following -- Impact  on safety and function Mild aspiration risk   CHL IP TREATMENT RECOMMENDATION 05/14/2017 Treatment Recommendations No treatment recommended at this time   No flowsheet data found. CHL IP DIET RECOMMENDATION 05/14/2017 SLP Diet Recommendations Thin liquid;Regular solids Liquid Administration via Cup Medication Administration Whole meds with liquid Compensations -- Postural Changes --   CHL IP OTHER RECOMMENDATIONS 05/14/2017 Recommended Consults -- Oral Care Recommendations Oral care BID Other Recommendations --   CHL IP FOLLOW UP RECOMMENDATIONS 05/14/2017 Follow up Recommendations None    No flowsheet data found.     CHL IP ORAL PHASE 05/14/2017 Oral Phase WFL Oral - Pudding Teaspoon -- Oral - Pudding Cup -- Oral - Honey Teaspoon -- Oral - Honey Cup -- Oral - Nectar Teaspoon -- Oral - Nectar Cup -- Oral - Nectar Straw -- Oral - Thin Teaspoon -- Oral - Thin Cup -- Oral - Thin Straw -- Oral - Puree -- Oral - Mech Soft -- Oral - Regular -- Oral - Multi-Consistency -- Oral - Pill -- Oral Phase - Comment --  CHL IP PHARYNGEAL PHASE 05/14/2017 Pharyngeal Phase WFL Pharyngeal- Pudding Teaspoon -- Pharyngeal -- Pharyngeal- Pudding Cup -- Pharyngeal -- Pharyngeal- Honey Teaspoon -- Pharyngeal -- Pharyngeal- Honey Cup -- Pharyngeal -- Pharyngeal- Nectar Teaspoon -- Pharyngeal -- Pharyngeal- Nectar Cup -- Pharyngeal -- Pharyngeal- Nectar Straw -- Pharyngeal -- Pharyngeal- Thin Teaspoon -- Pharyngeal -- Pharyngeal- Thin Cup -- Pharyngeal -- Pharyngeal- Thin Straw -- Pharyngeal -- Pharyngeal- Puree -- Pharyngeal -- Pharyngeal- Mechanical Soft -- Pharyngeal -- Pharyngeal- Regular -- Pharyngeal -- Pharyngeal- Multi-consistency -- Pharyngeal -- Pharyngeal- Pill -- Pharyngeal -- Pharyngeal Comment --  CHL IP CERVICAL ESOPHAGEAL PHASE 05/14/2017 Cervical Esophageal Phase (No Data) Pudding Teaspoon -- Pudding Cup -- Honey Teaspoon -- Honey Cup -- Nectar Teaspoon -- Nectar Cup -- Nectar Straw -- Thin Teaspoon -- Thin Cup -- Thin Straw -- Puree -- Mechanical Soft -- Regular -- Multi-consistency -- Pill -- Cervical Esophageal Comment -- No flowsheet data found. Juan Quam Laurice 05/14/2017, 2:06 PM               (Echo, Carotid, EGD, Colonoscopy, ERCP)    Subjective:   Discharge Exam: Vitals:   05/19/17 0747 05/19/17 0750  BP:    Pulse:    Resp:    Temp:    SpO2: 99% 99%   Vitals:   05/19/17 0456 05/19/17 0730 05/19/17 0747 05/19/17 0750  BP: 139/71     Pulse: 96     Resp: 16     Temp: 97.7 F (36.5 C)     TempSrc: Oral     SpO2: 100% 99% 99% 99%  Weight:      Height:        General: Pt  is alert, awake, not in acute distress Cardiovascular: RRR, S1/S2 +, no rubs, no gallops Respiratory: CTA bilaterally, no wheezing, no rhonchi Abdominal: Soft, NT, ND, bowel sounds + Extremities: no edema, no cyanosis    The results of significant diagnostics from this hospitalization (including imaging, microbiology, ancillary and laboratory) are listed below for reference.     Microbiology: Recent Results (from the past 240 hour(s))  Eye culture     Status: None   Collection Time: 05/13/17 12:11 AM  Result Value Ref Range Status   Specimen Description EYE EXUDATE  Final   Special Requests NONE  Final   Gram Stain NO WBC SEEN NO ORGANISMS SEEN   Final   Culture   Final    ABUNDANT HAEMOPHILUS INFLUENZAE BETA  LACTAMASE NEGATIVE CRITICAL RESULT CALLED TO, READ BACK BY AND VERIFIED WITH: Glynn Octave, RN AT 1332 05/14/17 BY L BENFIELD Performed at [facility], [place] [address] [place], [place] [address]    Report Status 05/15/2017 FINAL  Final  Respiratory Panel by PCR     Status: None   Collection Time: 05/13/17  3:56 AM  Result Value Ref Range Status   Adenovirus NOT DETECTED NOT DETECTED Final   Coronavirus 229E NOT DETECTED NOT DETECTED Final   Coronavirus HKU1 NOT DETECTED NOT DETECTED Final   Coronavirus NL63 NOT DETECTED NOT DETECTED Final   Coronavirus OC43 NOT DETECTED NOT DETECTED Final   Metapneumovirus NOT DETECTED NOT DETECTED Final   Rhinovirus / Enterovirus NOT DETECTED NOT DETECTED Final   Influenza A NOT DETECTED NOT DETECTED Final   Influenza B NOT DETECTED NOT DETECTED Final   Parainfluenza Virus 1 NOT DETECTED NOT DETECTED Final   Parainfluenza Virus 2 NOT DETECTED NOT DETECTED Final   Parainfluenza Virus 3 NOT DETECTED NOT DETECTED Final   Parainfluenza Virus 4 NOT DETECTED NOT DETECTED Final   Respiratory Syncytial Virus NOT DETECTED NOT DETECTED Final   Bordetella pertussis NOT DETECTED NOT DETECTED Final   Chlamydophila pneumoniae NOT DETECTED NOT  DETECTED Final   Mycoplasma pneumoniae NOT DETECTED NOT DETECTED Final  Culture, blood (routine x 2) Call MD if unable to obtain prior to antibiotics being given     Status: None   Collection Time: 05/13/17 12:06 PM  Result Value Ref Range Status   Specimen Description BLOOD RIGHT ARM  Final   Special Requests   Final    BOTTLES DRAWN AEROBIC AND ANAEROBIC Blood Culture adequate volume   Culture   Final    NO GROWTH 5 DAYS Performed at [facility], [address], [place], [place] [address]    Report Status 05/18/2017 FINAL  Final  Culture, blood (routine x 2) Call MD if unable to obtain prior to antibiotics being given     Status: None   Collection Time: 05/13/17 12:20 PM  Result Value Ref Range Status   Specimen Description BLOOD RIGHT ANTECUBITAL  Final   Special Requests   Final    BOTTLES DRAWN AEROBIC AND ANAEROBIC Blood Culture adequate volume   Culture   Final    NO GROWTH 5 DAYS Performed at [facility], [place] [address] [place], [place] [address]    Report Status 05/18/2017 FINAL  Final     Labs: BNP (last 3 results) Recent Labs    05/17/17 1547  BNP 952.8*   Basic Metabolic Panel: Recent Labs  Lab 05/13/17 1220 05/15/17 0614 05/16/17 0627 05/17/17 0533 05/18/17 0524  NA 129* 130* 133* 131* 132*  K 3.7 4.1 3.7 3.8 3.5  CL 94* 95* 97* 94* 95*  CO2 27 27 29 28 30   GLUCOSE 119* 154* 90 134* 138*  BUN 15 17 18 19 18   CREATININE 0.68 0.70 0.75 0.73 0.71  CALCIUM 7.6* 8.2* 8.1* 8.1* 7.9*   Liver Function Tests: Recent Labs  Lab 05/13/17 1220  AST 36  ALT 24  ALKPHOS 99  BILITOT 0.9  PROT 4.6*  ALBUMIN 2.2*   No results for input(s): LIPASE, AMYLASE in the last 168 hours. No results for input(s): AMMONIA in the last 168 hours. CBC: Recent Labs  Lab 05/12/17 2327 05/13/17 1220 05/17/17 0533  WBC 16.2* 19.9* 9.3  NEUTROABS 13.9* 18.0* 7.6  HGB 11.1* 10.9* 11.4*  HCT 32.0* 32.1*  34.4*  MCV 82.5 82.9 84.3  PLT 301 298 390    Cardiac Enzymes: No results for input(s): CKTOTAL, CKMB, CKMBINDEX, TROPONINI in the last 168 hours. BNP: Invalid input(s): POCBNP CBG: No results for input(s): GLUCAP in the last 168 hours. D-Dimer No results for input(s): DDIMER in the last 72 hours. Hgb A1c No results for input(s): HGBA1C in the last 72 hours. Lipid Profile No results for input(s): CHOL, HDL, LDLCALC, TRIG, CHOLHDL, LDLDIRECT in the last 72 hours. Thyroid function studies No results for input(s): TSH, T4TOTAL, T3FREE, THYROIDAB in the last 72 hours.  Invalid input(s): FREET3 Anemia work up No results for input(s): VITAMINB12, FOLATE, FERRITIN, TIBC, IRON, RETICCTPCT in the last 72 hours. Urinalysis    Component Value Date/Time   COLORURINE YELLOW 11/24/2016 1804   APPEARANCEUR CLEAR 11/24/2016 1804   LABSPEC <1.005 (L) 11/24/2016 1804   PHURINE 7.0 11/24/2016 1804   GLUCOSEU NEGATIVE 11/24/2016 1804   HGBUR SMALL (A) 11/24/2016 1804   BILIRUBINUR NEGATIVE 11/24/2016 1804   KETONESUR NEGATIVE 11/24/2016 1804   PROTEINUR 30 (A) 11/24/2016 1804   UROBILINOGEN 0.2 06/11/2013 1951   NITRITE NEGATIVE 11/24/2016 1804   LEUKOCYTESUR TRACE (A) 11/24/2016 1804   Sepsis Labs Invalid input(s): PROCALCITONIN,  WBC,  LACTICIDVEN Microbiology Recent Results (from the past 240 hour(s))  Eye culture     Status: None   Collection Time: 05/13/17 12:11 AM  Result Value Ref Range Status   Specimen Description EYE EXUDATE  Final   Special Requests NONE  Final   Gram Stain NO WBC SEEN NO ORGANISMS SEEN   Final   Culture   Final    ABUNDANT HAEMOPHILUS INFLUENZAE BETA LACTAMASE NEGATIVE CRITICAL RESULT CALLED TO, READ BACK BY AND VERIFIED WITH: Glynn Octave, RN AT 1332 05/14/17 BY L BENFIELD Performed at [facility], [place] [address] [place], [place] [address]    Report Status 05/15/2017 FINAL  Final  Respiratory Panel by PCR     Status: None   Collection Time: 05/13/17  3:56 AM  Result Value Ref Range Status    Adenovirus NOT DETECTED NOT DETECTED Final   Coronavirus 229E NOT DETECTED NOT DETECTED Final   Coronavirus HKU1 NOT DETECTED NOT DETECTED Final   Coronavirus NL63 NOT DETECTED NOT DETECTED Final   Coronavirus OC43 NOT DETECTED NOT DETECTED Final   Metapneumovirus NOT DETECTED NOT DETECTED Final   Rhinovirus / Enterovirus NOT DETECTED NOT DETECTED Final   Influenza A NOT DETECTED NOT DETECTED Final   Influenza B NOT DETECTED NOT DETECTED Final   Parainfluenza Virus 1 NOT DETECTED NOT DETECTED Final   Parainfluenza Virus 2 NOT DETECTED NOT DETECTED Final   Parainfluenza Virus 3 NOT DETECTED NOT DETECTED Final   Parainfluenza Virus 4 NOT DETECTED NOT DETECTED Final   Respiratory Syncytial Virus NOT DETECTED NOT DETECTED Final   Bordetella pertussis NOT DETECTED NOT DETECTED Final   Chlamydophila pneumoniae NOT DETECTED NOT DETECTED Final   Mycoplasma pneumoniae NOT DETECTED NOT DETECTED Final  Culture, blood (routine x 2) Call MD if unable to obtain prior to antibiotics being given     Status: None   Collection Time: 05/13/17 12:06 PM  Result Value Ref Range Status   Specimen Description BLOOD RIGHT ARM  Final   Special Requests   Final    BOTTLES DRAWN AEROBIC AND ANAEROBIC Blood Culture adequate volume   Culture   Final    NO GROWTH 5 DAYS Performed at [facility], [address]  [place], [place] [address]    Report Status 05/18/2017 FINAL  Final  Culture, blood (routine x 2) Call MD if unable to obtain prior to antibiotics being given     Status: None   Collection Time: 05/13/17 12:20 PM  Result Value Ref Range Status   Specimen Description BLOOD RIGHT ANTECUBITAL  Final   Special Requests   Final    BOTTLES DRAWN AEROBIC AND ANAEROBIC Blood Culture adequate volume   Culture   Final    NO GROWTH 5 DAYS Performed at [facility], [place] [address], [place], [place] [address]    Report Status 05/18/2017 FINAL  Final     Time coordinating discharge: Over 30  minutes  SIGNED:   Georgette Shell, MD  [facility] 05/19/2017, 10:04 AM Pager   If 7PM-7AM, please contact night-coverage [email or web address] Password [email or web address]

## 2017-05-19 NOTE — Progress Notes (Addendum)
Nutrition Follow-up  DOCUMENTATION CODES:   Not applicable  INTERVENTION:    Ensure Enlive po TID, each supplement provides 350 kcal and 20 grams of protein  Provide MVI daily  NUTRITION DIAGNOSIS:   Increased nutrient needs related to wound healing as evidenced by estimated needs.  Ongoing  GOAL:   Patient will meet greater than or equal to 90% of their needs  Progressing  MONITOR:   PO intake, Supplement acceptance, Weight trends, Labs  REASON FOR ASSESSMENT:   Consult Assessment of nutrition requirement/status  ASSESSMENT:   Pt with PMH significant for COPD, HLD, hypothyroidism, cardiomyopathy, and uterine cancer. Presents this admission with HCAP and mild acute exacerbation of mild persistent COPD. Pt noted to have bacterial conjunctivitis with purulent discharge.   Pt reports appetite is increasing back to baseline, Diet advanced to regular with thin liquids 12/21. Meal completions charted as 70% average for her last eight meals. Pt had two pancake with syrup for breakfast this morning. Reports not receiving any mighty shakes this stay. RD to change supplement order to Ensure Enlive TID. Pt eager to receive supplements. No recent weight has been obtained since last RD visit.   Conjunctivitis has resolved, pt able to see clearly. Plan for d/c to SNF. Will continue to monitor.   Medications reviewed and include: Vit D, lasix, MVI with minerals, prednisone, Vit B12 Labs reviewed: Na 132 (L) Cl 95 (L)   Diet Order:  Diet regular Room service appropriate? Yes; Fluid consistency: Thin  EDUCATION NEEDS:   Education needs have been addressed  Skin:  Skin Assessment: Skin Integrity Issues: Skin Integrity Issues:: Stage II Stage II: sacrum  Last BM:  05/16/17  Height:   Ht Readings from Last 1 Encounters:  05/12/17 4\' 10"  (1.473 m)    Weight:   Wt Readings from Last 1 Encounters:  05/12/17 110 lb (49.9 kg)    Ideal Body Weight:  43.2 kg  BMI:  Body  mass index is 22.99 kg/m.  Estimated Nutritional Needs:   Kcal:  1300-1500 kcal/day  Protein:  60-70 g/day  Fluid:  >1.3 L/day    Mariana Single RD, LDN Clinical Nutrition Pager # - [phone]

## 2017-05-19 NOTE — Progress Notes (Signed)
Date: May 19, 2017 Rhonda Davis, BSN, RN3, CCM [phone] Chart and notes review for patient progress and needs. Will follow for case management and discharge needs. Next review date: [id number] 

## 2017-05-19 NOTE — Progress Notes (Signed)
LCSW following for SNF placement.   Patient and daughter chose bed at [facility].  LCSW waiting to hear from facility on bed availability. Patient will need pre auth before she can go to facility.    LCSW will continue to follow.    Carolin Coy [facility]

## 2017-05-20 ENCOUNTER — Inpatient Hospital Stay (HOSPITAL_COMMUNITY): Payer: Medicare HMO

## 2017-05-20 DIAGNOSIS — J449 Chronic obstructive pulmonary disease, unspecified: Secondary | ICD-10-CM | POA: Diagnosis not present

## 2017-05-20 DIAGNOSIS — E440 Moderate protein-calorie malnutrition: Secondary | ICD-10-CM | POA: Diagnosis not present

## 2017-05-20 DIAGNOSIS — R278 Other lack of coordination: Secondary | ICD-10-CM | POA: Diagnosis not present

## 2017-05-20 DIAGNOSIS — N390 Urinary tract infection, site not specified: Secondary | ICD-10-CM | POA: Diagnosis not present

## 2017-05-20 DIAGNOSIS — I482 Chronic atrial fibrillation: Secondary | ICD-10-CM | POA: Diagnosis not present

## 2017-05-20 DIAGNOSIS — J80 Acute respiratory distress syndrome: Secondary | ICD-10-CM | POA: Diagnosis not present

## 2017-05-20 DIAGNOSIS — R0602 Shortness of breath: Secondary | ICD-10-CM | POA: Diagnosis not present

## 2017-05-20 DIAGNOSIS — R2689 Other abnormalities of gait and mobility: Secondary | ICD-10-CM | POA: Diagnosis not present

## 2017-05-20 DIAGNOSIS — J441 Chronic obstructive pulmonary disease with (acute) exacerbation: Secondary | ICD-10-CM | POA: Diagnosis not present

## 2017-05-20 DIAGNOSIS — I4891 Unspecified atrial fibrillation: Secondary | ICD-10-CM | POA: Diagnosis not present

## 2017-05-20 DIAGNOSIS — R600 Localized edema: Secondary | ICD-10-CM | POA: Diagnosis not present

## 2017-05-20 DIAGNOSIS — M6281 Muscle weakness (generalized): Secondary | ICD-10-CM | POA: Diagnosis not present

## 2017-05-20 DIAGNOSIS — E46 Unspecified protein-calorie malnutrition: Secondary | ICD-10-CM | POA: Diagnosis not present

## 2017-05-20 DIAGNOSIS — R410 Disorientation, unspecified: Secondary | ICD-10-CM | POA: Diagnosis not present

## 2017-05-20 DIAGNOSIS — R319 Hematuria, unspecified: Secondary | ICD-10-CM | POA: Diagnosis not present

## 2017-05-20 DIAGNOSIS — L89312 Pressure ulcer of right buttock, stage 2: Secondary | ICD-10-CM | POA: Diagnosis not present

## 2017-05-20 DIAGNOSIS — R69 Illness, unspecified: Secondary | ICD-10-CM | POA: Diagnosis not present

## 2017-05-20 DIAGNOSIS — J9622 Acute and chronic respiratory failure with hypercapnia: Secondary | ICD-10-CM | POA: Diagnosis not present

## 2017-05-20 DIAGNOSIS — J9601 Acute respiratory failure with hypoxia: Secondary | ICD-10-CM | POA: Diagnosis not present

## 2017-05-20 DIAGNOSIS — I10 Essential (primary) hypertension: Secondary | ICD-10-CM | POA: Diagnosis not present

## 2017-05-20 DIAGNOSIS — J189 Pneumonia, unspecified organism: Secondary | ICD-10-CM | POA: Diagnosis not present

## 2017-05-20 DIAGNOSIS — H1033 Unspecified acute conjunctivitis, bilateral: Secondary | ICD-10-CM | POA: Diagnosis not present

## 2017-05-20 DIAGNOSIS — J181 Lobar pneumonia, unspecified organism: Secondary | ICD-10-CM | POA: Diagnosis not present

## 2017-05-20 DIAGNOSIS — J9621 Acute and chronic respiratory failure with hypoxia: Secondary | ICD-10-CM | POA: Diagnosis not present

## 2017-05-20 DIAGNOSIS — K5730 Diverticulosis of large intestine without perforation or abscess without bleeding: Secondary | ICD-10-CM | POA: Diagnosis not present

## 2017-05-20 MED ORDER — HYDRALAZINE HCL 10 MG PO TABS: 10.0000 mg | ORAL_TABLET | Freq: Three times a day (TID) | ORAL | 0 refills | Status: AC

## 2017-05-20 MED ORDER — FUROSEMIDE 20 MG PO TABS
20.0000 mg | ORAL_TABLET | Freq: Every day | ORAL | 0 refills | Status: AC
Start: 2017-05-21 — End: ?

## 2017-05-20 MED ORDER — METOCLOPRAMIDE HCL 5 MG/ML IJ SOLN
5.0000 mg | Freq: Once | INTRAMUSCULAR | Status: AC
  Administered 2017-05-20: 5 mg via INTRAVENOUS
  Filled 2017-05-20: qty 2

## 2017-05-20 MED ORDER — PREDNISONE 10 MG PO TABS: ORAL_TABLET | ORAL | 0 refills | Status: AC

## 2017-05-20 NOTE — Discharge Summary (Signed)
Physician Discharge Summary  Patient ID: Jasmin Lee MRN: [id number] DOB/AGE: 81/26/1924 81 y.o.  PCP: [name], MD  Admit date: 05/12/2017 Discharge date: 05/20/2017  Admission Diagnoses:  Discharge Diagnoses:  Active Problems:   Essential hypertension   COPD (chronic obstructive pulmonary disease) (HCC)   Hypokalemia   Atrial fibrillation, chronic (HCC)   Protein-calorie malnutrition, moderate (HCC)   CAP (community acquired pneumonia)   Acute on chronic respiratory failure with hypoxia (HCC)   Pressure injury of skin   Discharged Condition: Stable  Brief Summary: Patient is a 83 year old femalepast medical history of chronic atrial fibrillation on Eliquis, COPD comes into the hospital complaining of shortness of breath that started 3 days prior to admission with a productive cough, and x-ray was done in the ED that showed possible pneumonia with a leukocytosis.    Active Problems:   Essential hypertension   COPD (chronic obstructive pulmonary disease) (HCC)   Hypokalemia   Atrial fibrillation, chronic (HCC)   Protein-calorie malnutrition, moderate (HCC)   CAP (community acquired pneumonia)   Acute on chronic respiratory failure with hypoxia (HCC)   Pressure injury of skin  Acute on chronic respiratory failure with hypoxia secondary to CAP and COPD exacerbation:  Completed course of antibiotics with levaquin. Leukocytosis resolved.  SLP consulted and recommendations given.  PT eval recommending SNF.   Acute bacterial conjunctivitis: Resolved. On erythromycin eye ointment.   Hypokalemia: replaced.   Chronic atrial fibrillation:  Rate controlled with eliquis.   Hypertension:  Well controlled.    Mod protein calorie malnutrition:  Dietary consulted.   Consults: None  Significant Diagnostic Studies: imaging studies  Treatments: See above. Antibiotics and Nebulizer treatment.  Discharge Exam: Blood pressure 124/74, pulse  (!) 116, temperature 98.6 F (37 C), temperature source Axillary, resp. rate 18, height 4\' 10"  (1.473 m), weight 49.9 kg (110 lb), SpO2 99 %.   Disposition: SNF  Discharge Instructions    Call MD for:   Complete by:  As directed    Call MD for worsening of symptoms   Diet - low sodium heart healthy   Complete by:  As directed    Increase activity slowly   Complete by:  As directed      Allergies as of 05/20/2017      Reactions   Almond (diagnostic) Shortness Of Breath   Asa [aspirin] Shortness Of Breath, Other (See Comments)   On high doses of ASA   Coconut Flavor Shortness Of Breath   Neomycin Shortness Of Breath   Peanut-containing Drug Products Shortness Of Breath   Penicillins Shortness Of Breath, Other (See Comments)   Has patient had a PCN reaction causing immediate rash, facial/tongue/throat swelling, SOB or lightheadedness with hypotension: Yes Has patient had a PCN reaction causing severe rash involving mucus membranes or skin necrosis: No Has patient had a PCN reaction that required hospitalization No Has patient had a PCN reaction occurring within the last 10 years: No If all of the above answers are "NO", then may proceed with Cephalosporin use.   Sulfa Antibiotics Shortness Of Breath   Tizanidine Shortness Of Breath   Lisinopril Cough   Keflex [cephalexin] Rash      Medication List    STOP taking these medications   traMADol 50 MG tablet Commonly known as:  ULTRAM     TAKE these medications   acetaminophen 500 MG tablet Commonly known as:  TYLENOL Take 1,000 mg by mouth 3 (three) times daily as needed for mild pain, moderate  pain, fever or headache.   ADVAIR DISKUS 250-50 MCG/DOSE Aepb Generic drug:  Fluticasone-Salmeterol Inhale 2 puffs into the lungs 2 (two) times daily.   albuterol 108 (90 Base) MCG/ACT inhaler Commonly known as:  PROVENTIL HFA;VENTOLIN HFA Inhale 2 puffs into the lungs every 6 (six) hours as needed for wheezing or shortness of  breath. What changed:  Another medication with the same name was removed. Continue taking this medication, and follow the directions you see here.   apixaban 2.5 MG Tabs tablet Commonly known as:  ELIQUIS Take 1 tablet (2.5 mg total) by mouth 2 (two) times daily. What changed:  Another medication with the same name was removed. Continue taking this medication, and follow the directions you see here.   ARTIFICIAL TEARS 1.4 % ophthalmic solution Generic drug:  polyvinyl alcohol Place 1 drop into both eyes 3 (three) times daily.   atorvastatin 10 MG tablet Commonly known as:  LIPITOR Take 10 mg by mouth every other day.   BIOFREEZE 4 % Gel Generic drug:  Menthol (Topical Analgesic) Apply 1 application topically 3 (three) times daily.   CALCIUM-MAGNESIUM-ZINC PO Take 1 tablet by mouth daily.   Cholecalciferol 1000 units tablet Take 1,000 Units by mouth daily.   cyanocobalamin 1000 MCG tablet Take 1,000 mcg by mouth daily with breakfast.   diltiazem 180 MG 24 hr capsule Commonly known as:  CARDIZEM CD Take 1 capsule (180 mg total) by mouth daily.   feeding supplement (ENSURE ENLIVE) Liqd Take 237 mLs by mouth 3 (three) times daily between meals.   fluticasone 50 MCG/ACT nasal spray Commonly known as:  FLONASE Place 2 sprays into both nostrils 2 (two) times daily.   furosemide 20 MG tablet Commonly known as:  LASIX Take 1 tablet (20 mg total) by mouth daily. Start taking on:  05/21/2017 What changed:    when to take this  additional instructions   hydrALAZINE 10 MG tablet Commonly known as:  APRESOLINE Take 1 tablet (10 mg total) by mouth every 8 (eight) hours. What changed:  See the new instructions.   levothyroxine 25 MCG tablet Commonly known as:  SYNTHROID, LEVOTHROID Take 1 tablet (25 mcg total) by mouth daily before breakfast.   montelukast 10 MG tablet Commonly known as:  SINGULAIR Take 10 mg by mouth at bedtime.   multivitamin with minerals Tabs  tablet Take 1 tablet by mouth daily with breakfast.   OCUVITE-LUTEIN PO Take 1 tablet by mouth daily.   pantoprazole 40 MG tablet Commonly known as:  PROTONIX Take 1 tablet (40 mg total) by mouth daily.   phenazopyridine 200 MG tablet Commonly known as:  PYRIDIUM Take 200 mg by mouth as needed for pain.   polyethylene glycol packet Commonly known as:  MIRALAX / GLYCOLAX Take 17 g by mouth daily as needed for mild constipation.   predniSONE 10 MG tablet Commonly known as:  DELTASONE Prednisone 40mg  po once daily for 3 days, then 30mg  po once daily for 3 days, then 20mg  po once daily for 3 days, then 10mg  po once daily for 3 days and stop.   tiZANidine 2 MG tablet Commonly known as:  ZANAFLEX Take 2 mg by mouth 2 (two) times daily.      Contact information for after-discharge care    Destination    [facility] .   Service:  Skilled Chiropodist information: [place] [phone]  SignedBonnell Public 05/20/2017, 8:25 PM

## 2017-05-20 NOTE — Progress Notes (Signed)
Pt has increased nausea and episodes of "spit-up". Zofran and Compazine were ineffective. On-call MD was paged and abd x-ray ordered and NPO w/ ice chips and sips with meds were ordered. Will continue to monitor.

## 2017-05-20 NOTE — Progress Notes (Signed)
Report called to [facility]. Pt to be transported by [facility]

## 2017-05-20 NOTE — Progress Notes (Signed)
PT Cancellation Note  Patient Details Name: KAREEM AUL MRN: [id number] DOB: [birth year]   Cancelled Treatment:    Reason Eval/Treat Not Completed: Medical issues which prohibited therapy, onset of N/V, pt. Reports that she has a lump in her throat. Declined PT at this time.    Claretha Cooper 05/20/2017, 8:35 AM Tresa Endo PT [phone]

## 2017-05-20 NOTE — Clinical Social Work Placement (Signed)
    3:58 PM Patient and family chose bed at [facility].  LCSW confirmed bed with facility.  LCSW notified family.  LCSW faxed dc documents via hub.  RN report #: [id number]  BKJ    CLINICAL SOCIAL WORK PLACEMENT  NOTE  Date:  05/20/2017  Patient Details  Name: Jasmin Lee MRN: [id number] Date of Birth: [birth year]  Clinical Social Work is seeking post-discharge placement for this patient at the [place] level of care (*CSW will initial, date and re-position this form in  chart as items are completed):  Yes   Patient/family provided with [facility] Work Department's list of facilities offering this level of care within the geographic area requested by the patient (or if unable, by the patient's family).  Yes   Patient/family informed of their freedom to choose among providers that offer the needed level of care, that participate in Medicare, Medicaid or managed care program needed by the patient, have an available bed and are willing to accept the patient.  Yes   Patient/family informed of [facility] ownership interest in [facility] and [facility], as well as of the fact that they are under no obligation to receive care at these facilities.  PASRR submitted to EDS on       PASRR number received on       Existing PASRR number confirmed on 05/16/17     FL2 transmitted to all facilities in geographic area requested by pt/family on 05/16/17     FL2 transmitted to all facilities within larger geographic area on       Patient informed that his/her managed care company has contracts with or will negotiate with certain facilities, including the following:        Yes   Patient/family informed of bed offers received.  Patient chooses bed at [facility]     Physician recommends and patient chooses bed at [facility]    Patient to be transferred to [facility] on  05/20/17.  Patient to be transferred to facility by EMS     Patient family notified on 05/20/17 of transfer.  Name of family member notified:  Daughter, Lattie Haw and [name], Family friend(Left message for daughter)     PHYSICIAN Please sign DNR, Please sign FL2     Additional Comment:    _______________________________________________ Servando Snare, LCSW 05/20/2017, 3:58 PM

## 2017-05-20 NOTE — Progress Notes (Signed)
[facility] arrived to transport pt, pts DNR form was not in chart and D/C order req not completed. Paged MD Marthenia Rolling, MD informed me to call the swing MD and ask if they could complete these tasks. MD Marthenia Rolling was again called to inform swing MD could not help and stated the pt is his and he needs to take care of it or to call the evening MD and ask them. MD Ogbata told this RN that he was not coming in and the night MD would need to be made aware since he was not here to see who is on for tonight.

## 2017-05-21 DIAGNOSIS — I482 Chronic atrial fibrillation: Secondary | ICD-10-CM | POA: Diagnosis not present

## 2017-05-21 DIAGNOSIS — J441 Chronic obstructive pulmonary disease with (acute) exacerbation: Secondary | ICD-10-CM | POA: Diagnosis not present

## 2017-05-21 DIAGNOSIS — J189 Pneumonia, unspecified organism: Secondary | ICD-10-CM | POA: Diagnosis not present

## 2017-05-21 DIAGNOSIS — I10 Essential (primary) hypertension: Secondary | ICD-10-CM | POA: Diagnosis not present

## 2017-05-24 DIAGNOSIS — C55 Malignant neoplasm of uterus, part unspecified: Secondary | ICD-10-CM | POA: Insufficient documentation

## 2017-05-24 DIAGNOSIS — M1990 Unspecified osteoarthritis, unspecified site: Secondary | ICD-10-CM | POA: Insufficient documentation

## 2017-05-24 DIAGNOSIS — IMO0002 Reserved for concepts with insufficient information to code with codable children: Secondary | ICD-10-CM | POA: Insufficient documentation

## 2017-05-24 DIAGNOSIS — T4145XA Adverse effect of unspecified anesthetic, initial encounter: Secondary | ICD-10-CM | POA: Insufficient documentation

## 2017-05-24 DIAGNOSIS — J449 Chronic obstructive pulmonary disease, unspecified: Secondary | ICD-10-CM | POA: Insufficient documentation

## 2017-05-24 DIAGNOSIS — J42 Unspecified chronic bronchitis: Secondary | ICD-10-CM | POA: Insufficient documentation

## 2017-05-24 DIAGNOSIS — I10 Essential (primary) hypertension: Secondary | ICD-10-CM | POA: Insufficient documentation

## 2017-05-24 DIAGNOSIS — J189 Pneumonia, unspecified organism: Secondary | ICD-10-CM | POA: Insufficient documentation

## 2017-05-24 DIAGNOSIS — T8859XA Other complications of anesthesia, initial encounter: Secondary | ICD-10-CM | POA: Insufficient documentation

## 2017-05-24 DIAGNOSIS — I429 Cardiomyopathy, unspecified: Secondary | ICD-10-CM | POA: Insufficient documentation

## 2017-05-28 DIAGNOSIS — J9601 Acute respiratory failure with hypoxia: Secondary | ICD-10-CM | POA: Diagnosis not present

## 2017-05-28 DIAGNOSIS — I4891 Unspecified atrial fibrillation: Secondary | ICD-10-CM | POA: Diagnosis not present

## 2017-05-28 DIAGNOSIS — R69 Illness, unspecified: Secondary | ICD-10-CM | POA: Diagnosis not present

## 2017-05-28 DIAGNOSIS — I482 Chronic atrial fibrillation: Secondary | ICD-10-CM | POA: Diagnosis not present

## 2017-05-28 DIAGNOSIS — R600 Localized edema: Secondary | ICD-10-CM | POA: Diagnosis not present

## 2017-05-28 DIAGNOSIS — I10 Essential (primary) hypertension: Secondary | ICD-10-CM | POA: Diagnosis not present

## 2017-05-28 DIAGNOSIS — J189 Pneumonia, unspecified organism: Secondary | ICD-10-CM | POA: Diagnosis not present

## 2017-05-28 DIAGNOSIS — J449 Chronic obstructive pulmonary disease, unspecified: Secondary | ICD-10-CM | POA: Diagnosis not present

## 2017-05-28 DIAGNOSIS — E46 Unspecified protein-calorie malnutrition: Secondary | ICD-10-CM | POA: Diagnosis not present

## 2017-06-04 DIAGNOSIS — I482 Chronic atrial fibrillation: Secondary | ICD-10-CM | POA: Diagnosis not present

## 2017-06-04 DIAGNOSIS — J189 Pneumonia, unspecified organism: Secondary | ICD-10-CM | POA: Diagnosis not present

## 2017-06-04 DIAGNOSIS — J441 Chronic obstructive pulmonary disease with (acute) exacerbation: Secondary | ICD-10-CM | POA: Diagnosis not present

## 2017-06-04 DIAGNOSIS — R600 Localized edema: Secondary | ICD-10-CM | POA: Diagnosis not present

## 2017-06-11 DIAGNOSIS — J441 Chronic obstructive pulmonary disease with (acute) exacerbation: Secondary | ICD-10-CM | POA: Diagnosis not present

## 2017-06-11 DIAGNOSIS — J189 Pneumonia, unspecified organism: Secondary | ICD-10-CM | POA: Diagnosis not present

## 2017-06-11 DIAGNOSIS — E440 Moderate protein-calorie malnutrition: Secondary | ICD-10-CM | POA: Diagnosis not present

## 2017-06-11 DIAGNOSIS — R600 Localized edema: Secondary | ICD-10-CM | POA: Diagnosis not present

## 2017-06-16 DIAGNOSIS — R600 Localized edema: Secondary | ICD-10-CM | POA: Diagnosis not present

## 2017-06-16 DIAGNOSIS — I482 Chronic atrial fibrillation: Secondary | ICD-10-CM | POA: Diagnosis not present

## 2017-06-16 DIAGNOSIS — J189 Pneumonia, unspecified organism: Secondary | ICD-10-CM | POA: Diagnosis not present

## 2017-06-16 DIAGNOSIS — J441 Chronic obstructive pulmonary disease with (acute) exacerbation: Secondary | ICD-10-CM | POA: Diagnosis not present

## 2017-06-21 ENCOUNTER — Ambulatory Visit: Payer: Medicare HMO | Admitting: Cardiology

## 2017-06-22 ENCOUNTER — Encounter: Payer: Self-pay | Admitting: *Deleted

## 2017-06-25 DIAGNOSIS — I10 Essential (primary) hypertension: Secondary | ICD-10-CM | POA: Diagnosis not present

## 2017-06-25 DIAGNOSIS — I482 Chronic atrial fibrillation: Secondary | ICD-10-CM | POA: Diagnosis not present

## 2017-06-25 DIAGNOSIS — R410 Disorientation, unspecified: Secondary | ICD-10-CM | POA: Diagnosis not present

## 2017-06-25 DIAGNOSIS — J441 Chronic obstructive pulmonary disease with (acute) exacerbation: Secondary | ICD-10-CM | POA: Diagnosis not present

## 2017-06-26 DIAGNOSIS — N390 Urinary tract infection, site not specified: Secondary | ICD-10-CM | POA: Diagnosis not present

## 2017-06-26 DIAGNOSIS — R319 Hematuria, unspecified: Secondary | ICD-10-CM | POA: Diagnosis not present

## 2017-07-02 DIAGNOSIS — J449 Chronic obstructive pulmonary disease, unspecified: Secondary | ICD-10-CM | POA: Diagnosis not present

## 2017-07-02 DIAGNOSIS — I4891 Unspecified atrial fibrillation: Secondary | ICD-10-CM | POA: Diagnosis not present

## 2017-07-02 DIAGNOSIS — R69 Illness, unspecified: Secondary | ICD-10-CM | POA: Diagnosis not present

## 2017-07-02 DIAGNOSIS — I10 Essential (primary) hypertension: Secondary | ICD-10-CM | POA: Diagnosis not present

## 2017-07-02 DIAGNOSIS — E46 Unspecified protein-calorie malnutrition: Secondary | ICD-10-CM | POA: Diagnosis not present

## 2017-07-19 ENCOUNTER — Encounter: Payer: Medicare HMO | Admitting: Podiatry

## 2017-07-19 ENCOUNTER — Ambulatory Visit: Payer: Medicare HMO | Admitting: Podiatry

## 2017-07-23 DEATH — deceased

## 2017-07-26 NOTE — Progress Notes (Signed)
This encounter was created in error - please disregard.

## 2019-02-22 IMAGING — CR DG SACRUM/COCCYX 2+V
3 series · 3 of 3 positions shown · non-contrast
Comparison: Pelvic MRI 10/05/2016

CLINICAL DATA: Fall with lumbosacral and sacrococcygeal back pain.

EXAM:
SACRUM AND COCCYX - 2+ VIEW

[coccyx ap]
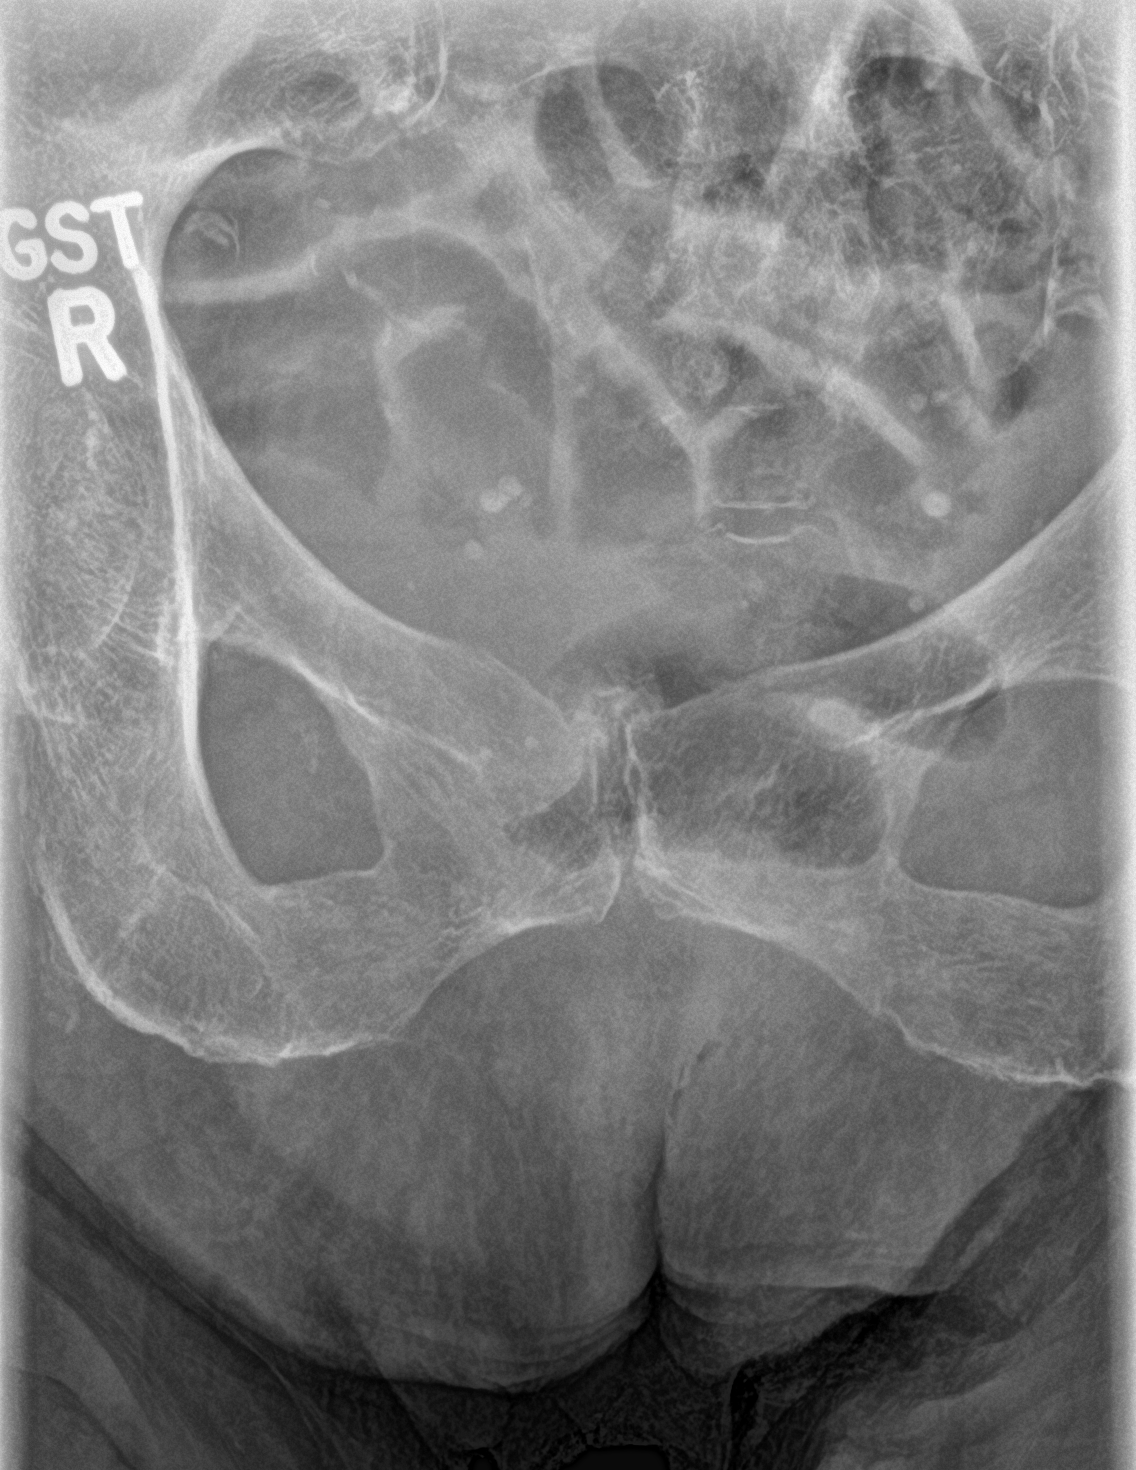

[sacrum ap]
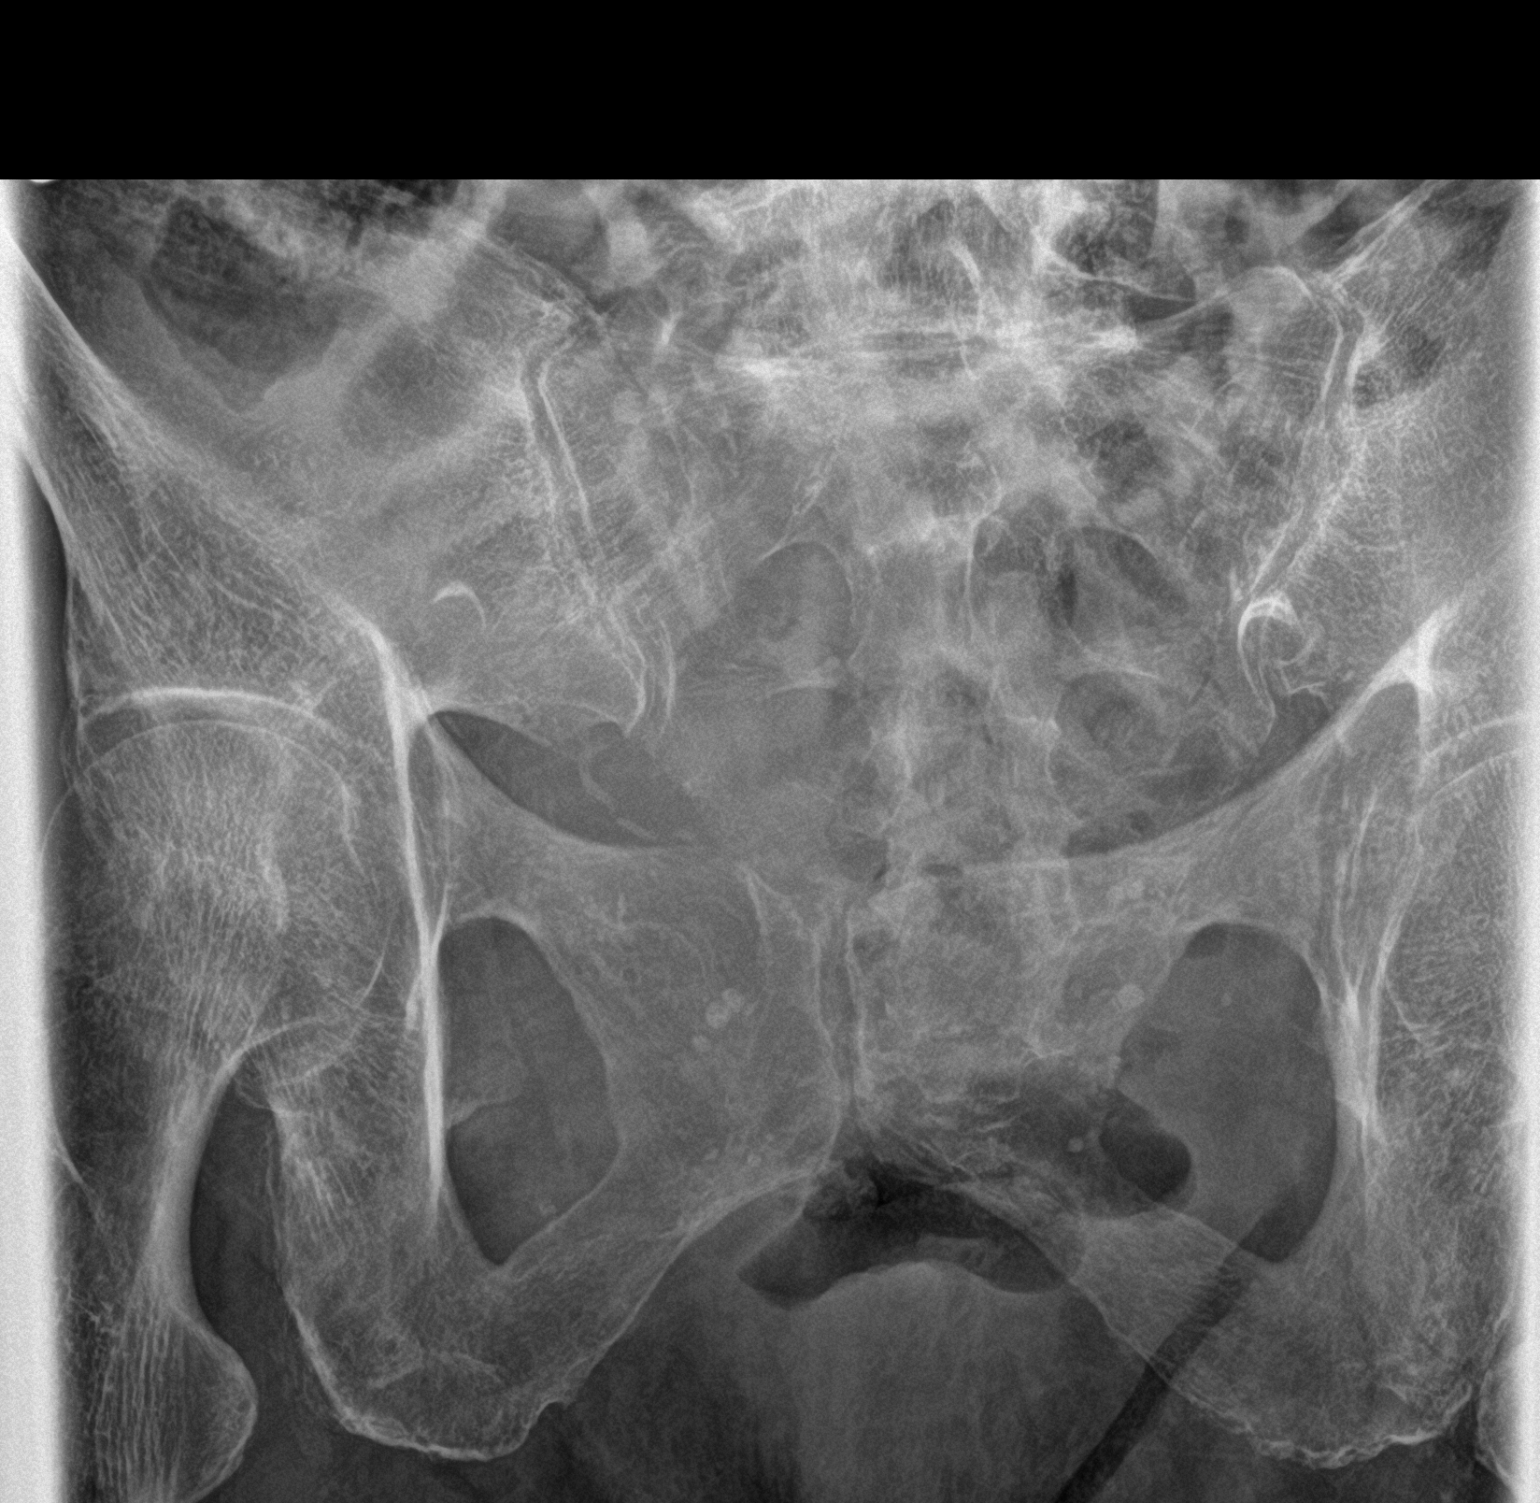

[sacrum lat]
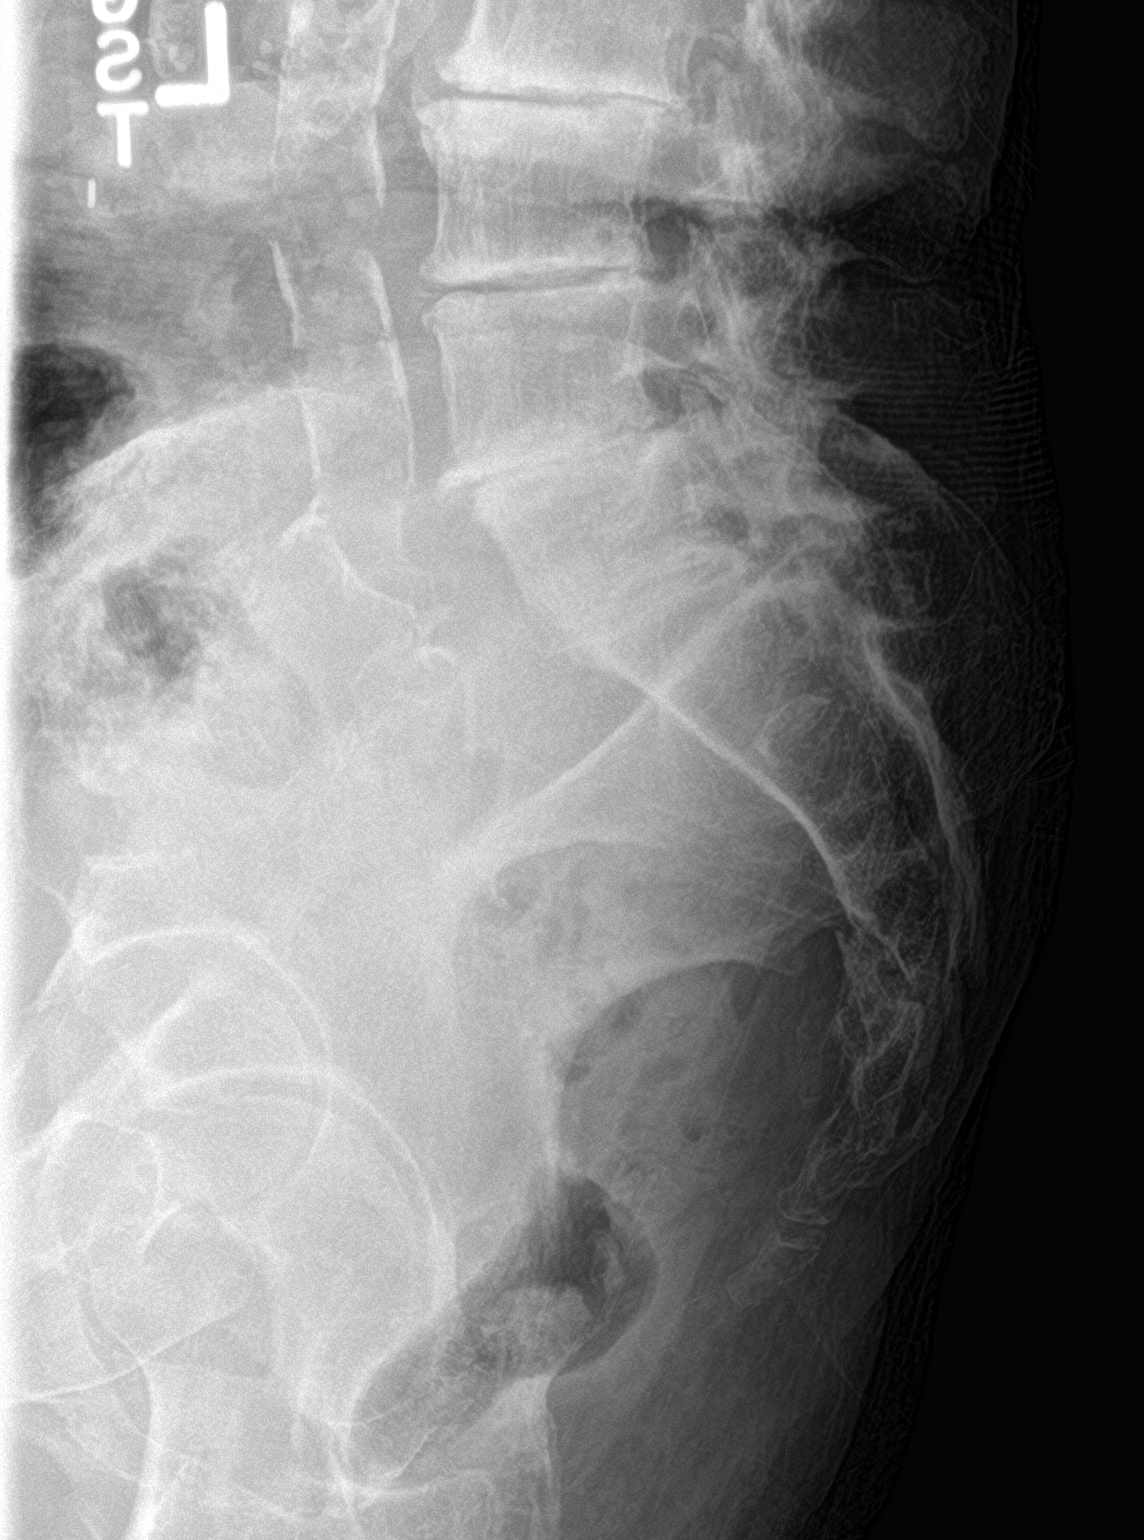

[3 of 3 positions shown; findings below may reference images not displayed]

FINDINGS: Minimally displaced fracture in the lower sacrum, as seen on prior
MRI. No evidence of new fracture. The sacral ala are maintained.
Sacroiliac joints are congruent..
IMPRESSION: Minimally displaced sacral fracture, as seen on recent MRI. No
evidence of new fracture.

## 2019-02-22 IMAGING — CR DG PELVIS 1-2V
1 series · 1 of 1 positions shown · non-contrast
Comparison: Pelvic MRI 10/05/2016

CLINICAL DATA: Fall with lumbosacral and sacrococcygeal back pain.

EXAM:
PELVIS - 1-2 VIEW

[pelvis ap]
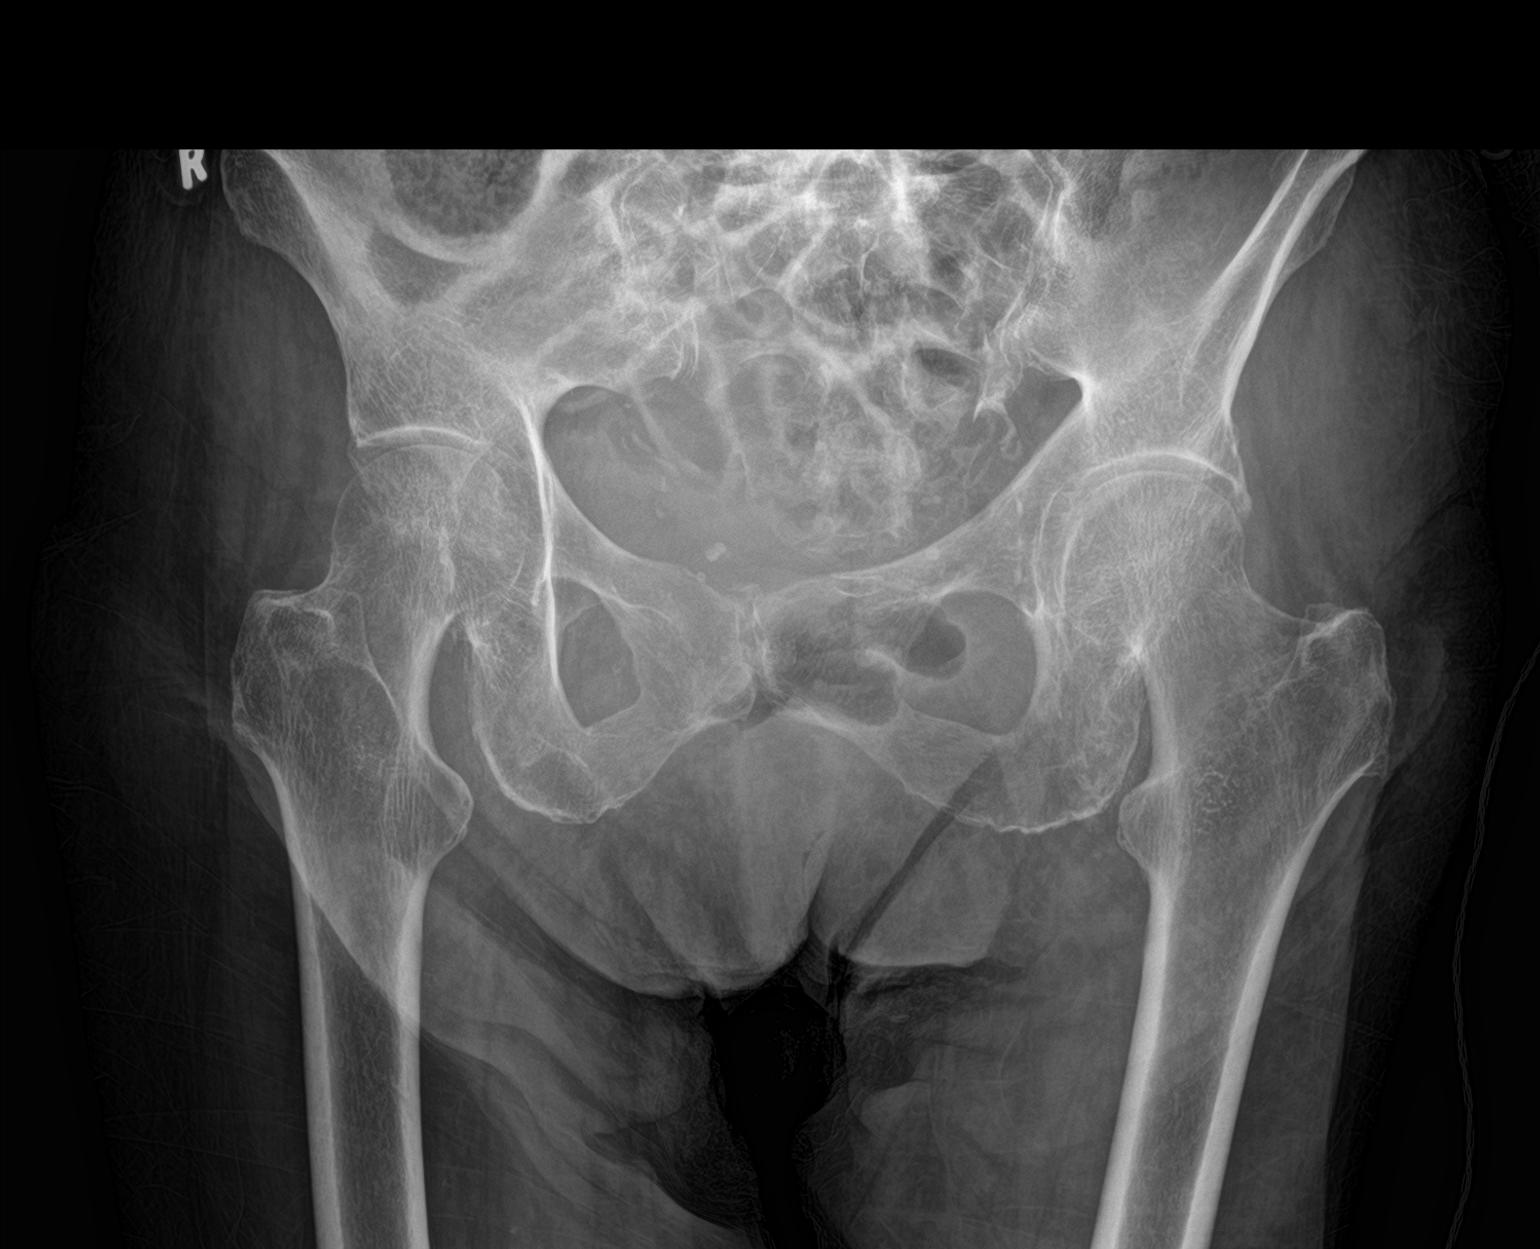

[1 of 1 positions shown; findings below may reference images not displayed]

FINDINGS: Cortical margins of the bony pelvis are intact. The as for sacral
fracture on prior MRI is not seen on AP view the pelvis. Pubic rami
are intact. Both femoral heads are seated in the acetabulum. Pubic
symphysis and sacroiliac joints are congruent.
IMPRESSION: No evidence of acute pelvic fracture.

## 2019-02-22 IMAGING — CR DG ELBOW 2V*R*
2 series · 2 of 2 positions shown · non-contrast
Comparison: None.

CLINICAL DATA: [AGE] female with fall and right elbow pain.

EXAM:
RIGHT ELBOW - 2 VIEW

[elbow ap]
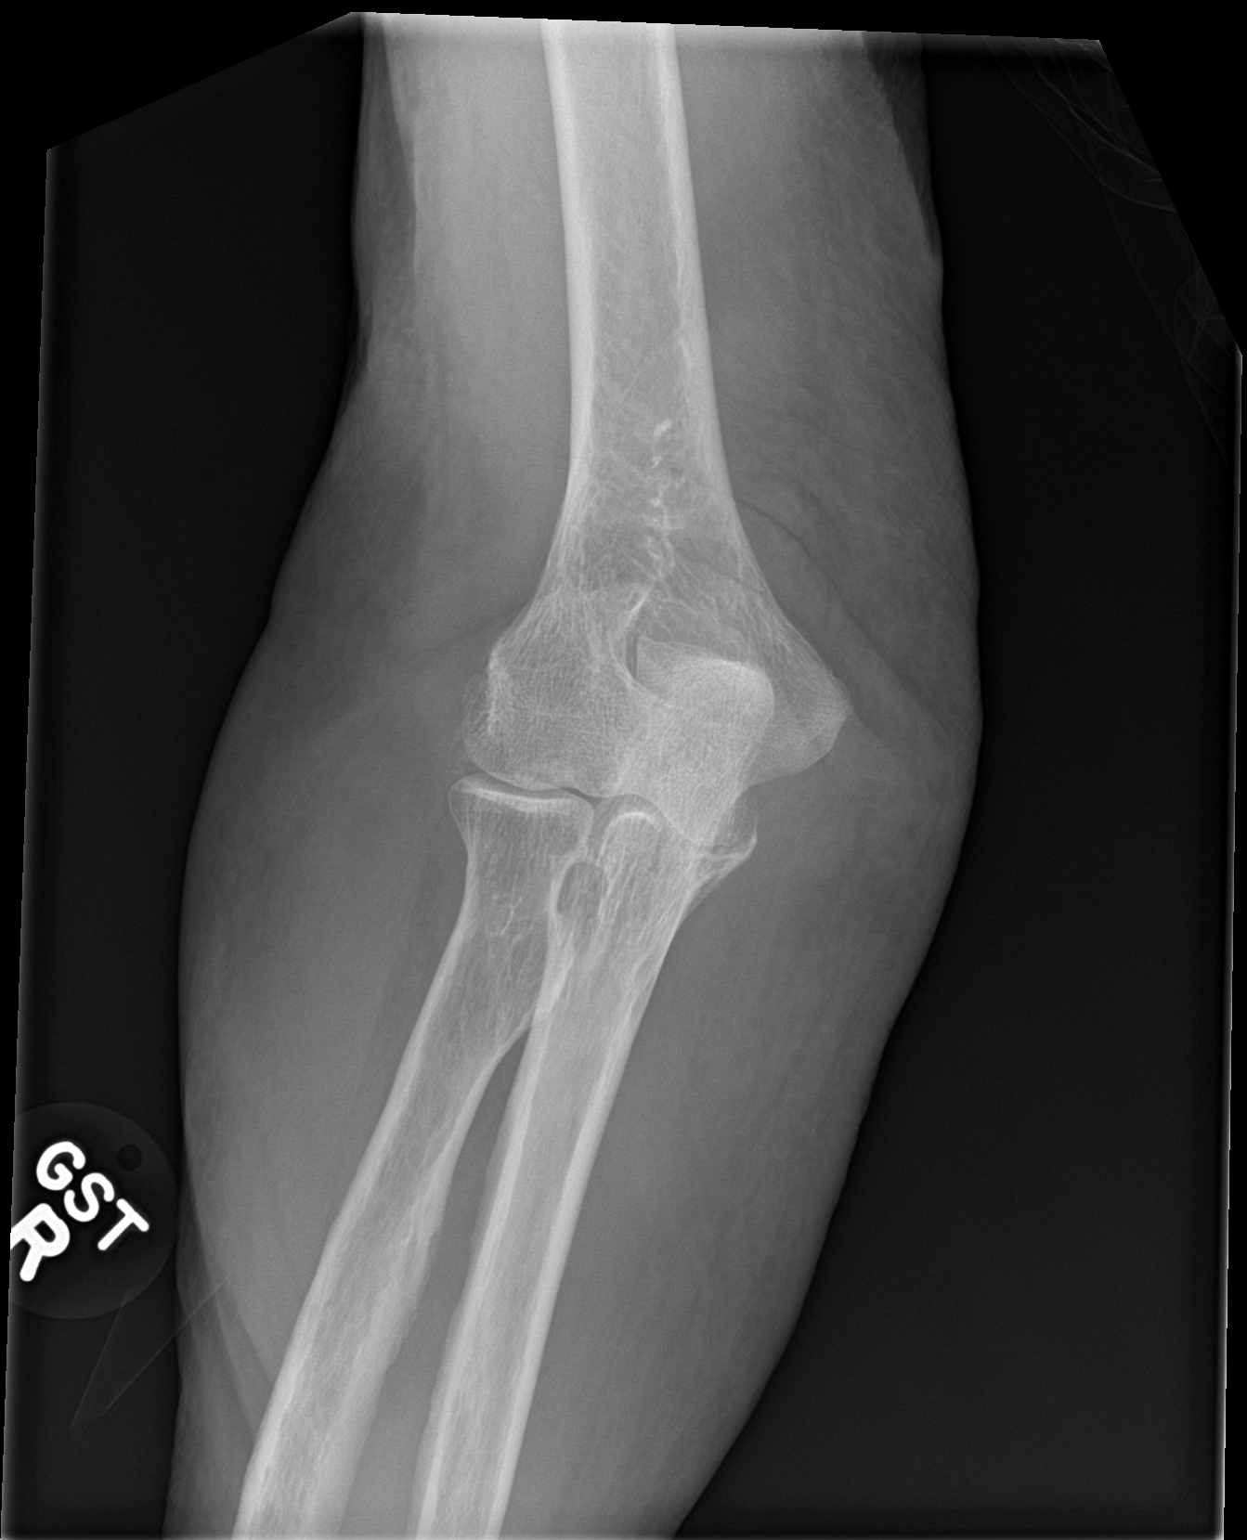

[elbow lat]
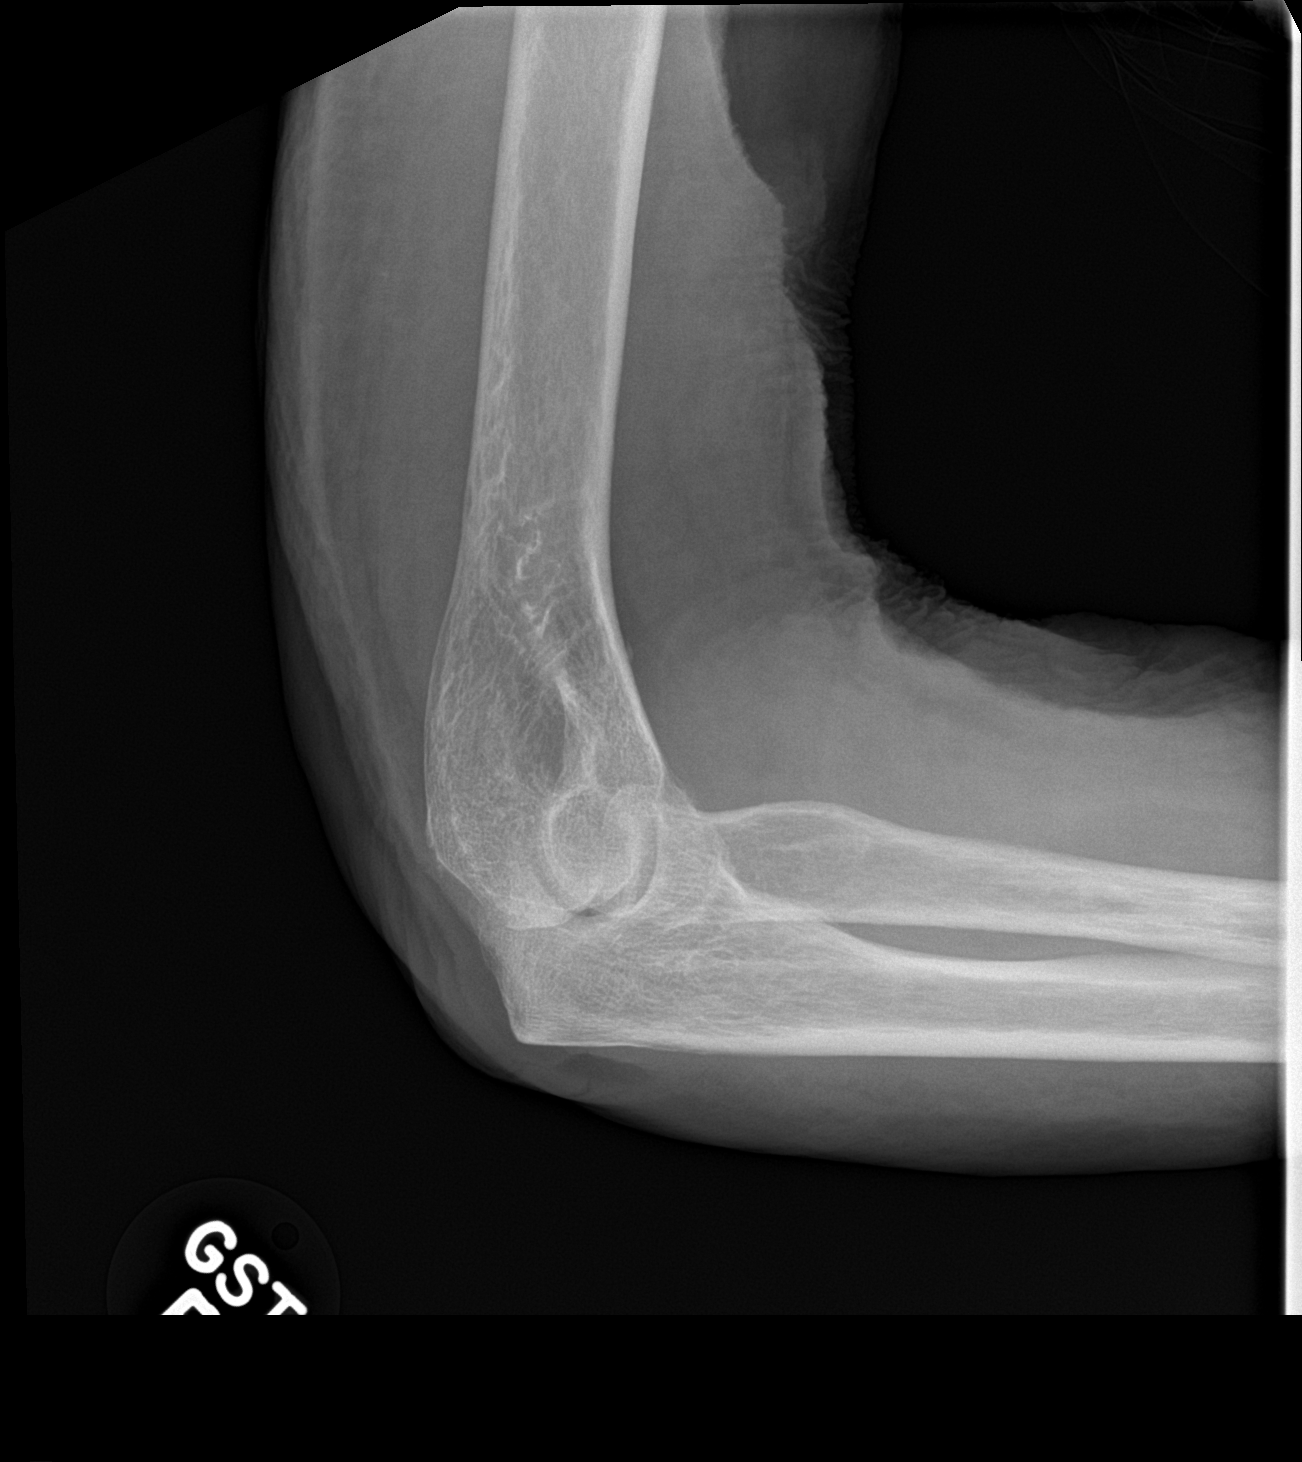

[2 of 2 positions shown; findings below may reference images not displayed]

FINDINGS: No obvious fracture identified. Evaluation of the fracture however
is limited due to osteopenia. There is no dislocation. There is
elevation of the anterior fat pad indicative of joint effusion. An
occult fracture is not entirely excluded CT may provide better
evaluation if there is high clinical suspicion for fracture. There
is mild diffuse subcutaneous edema.
IMPRESSION: No definite acute fracture. CT may provide better evaluation if
there is high clinical concern for an occult fracture.
